# Patient Record
Sex: Male | Born: 1955 | Race: White | Hispanic: No | Smoking: Current every day smoker
Health system: Southern US, Community
[De-identification: ages and names within clinical notes are randomized; demographics above are authoritative.]

## PROBLEM LIST (undated history)

## (undated) DIAGNOSIS — R0600 Dyspnea, unspecified: Secondary | ICD-10-CM

## (undated) DIAGNOSIS — K219 Gastro-esophageal reflux disease without esophagitis: Secondary | ICD-10-CM

## (undated) DIAGNOSIS — F419 Anxiety disorder, unspecified: Secondary | ICD-10-CM

## (undated) DIAGNOSIS — G545 Neuralgic amyotrophy: Secondary | ICD-10-CM

## (undated) DIAGNOSIS — J45909 Unspecified asthma, uncomplicated: Secondary | ICD-10-CM

## (undated) DIAGNOSIS — T8859XA Other complications of anesthesia, initial encounter: Secondary | ICD-10-CM

## (undated) DIAGNOSIS — D649 Anemia, unspecified: Secondary | ICD-10-CM

## (undated) DIAGNOSIS — J449 Chronic obstructive pulmonary disease, unspecified: Secondary | ICD-10-CM

## (undated) DIAGNOSIS — C801 Malignant (primary) neoplasm, unspecified: Secondary | ICD-10-CM

## (undated) DIAGNOSIS — E559 Vitamin D deficiency, unspecified: Secondary | ICD-10-CM

## (undated) DIAGNOSIS — G629 Polyneuropathy, unspecified: Secondary | ICD-10-CM

## (undated) DIAGNOSIS — J189 Pneumonia, unspecified organism: Secondary | ICD-10-CM

## (undated) DIAGNOSIS — N433 Hydrocele, unspecified: Secondary | ICD-10-CM

## (undated) DIAGNOSIS — T4145XA Adverse effect of unspecified anesthetic, initial encounter: Secondary | ICD-10-CM

## (undated) DIAGNOSIS — M1990 Unspecified osteoarthritis, unspecified site: Secondary | ICD-10-CM

## (undated) HISTORY — PX: UPPER GI ENDOSCOPY: SHX6162

## (undated) HISTORY — PX: HERNIA REPAIR: SHX51

## (undated) HISTORY — PX: OTHER SURGICAL HISTORY: SHX169

## (undated) HISTORY — DX: Neuralgic amyotrophy: G54.5

## (undated) SURGERY — BRONCHOSCOPY, WITH FLUOROSCOPY
Anesthesia: General | Laterality: Bilateral

---

## 2003-01-29 DIAGNOSIS — C801 Malignant (primary) neoplasm, unspecified: Secondary | ICD-10-CM

## 2003-01-29 HISTORY — DX: Malignant (primary) neoplasm, unspecified: C80.1

## 2003-09-09 ENCOUNTER — Ambulatory Visit (HOSPITAL_COMMUNITY): Admission: RE | Admit: 2003-09-09 | Discharge: 2003-09-09 | Payer: Self-pay | Admitting: Family Medicine

## 2003-09-13 ENCOUNTER — Emergency Department (HOSPITAL_COMMUNITY): Admission: EM | Admit: 2003-09-13 | Discharge: 2003-09-13 | Payer: Self-pay | Admitting: Emergency Medicine

## 2003-10-06 ENCOUNTER — Emergency Department (HOSPITAL_COMMUNITY): Admission: EM | Admit: 2003-10-06 | Discharge: 2003-10-07 | Payer: Self-pay | Admitting: Emergency Medicine

## 2003-10-06 ENCOUNTER — Ambulatory Visit (HOSPITAL_BASED_OUTPATIENT_CLINIC_OR_DEPARTMENT_OTHER): Admission: RE | Admit: 2003-10-06 | Discharge: 2003-10-06 | Payer: Self-pay | Admitting: General Surgery

## 2003-10-06 ENCOUNTER — Encounter (INDEPENDENT_AMBULATORY_CARE_PROVIDER_SITE_OTHER): Payer: Self-pay | Admitting: Specialist

## 2003-10-06 ENCOUNTER — Ambulatory Visit (HOSPITAL_COMMUNITY): Admission: RE | Admit: 2003-10-06 | Discharge: 2003-10-06 | Payer: Self-pay | Admitting: General Surgery

## 2004-05-31 IMAGING — CR DG CHEST 2V
2 series · 2 of 2 positions shown · non-contrast
Comparison: none

CLINICAL DATA: Shortness of breath.
 TWO-VIEW CHEST, [DATE] 
 There is diffuse hyperinflation consistent with changes of COPD or asthmatic bronchitic.  Accentuated bronchial markings.  There are no infiltrates.  The heart and mediastinal structures are normal.
 IMPRESSION 
 Diffuse hyperinflation with accentuated bronchial markings consistent with asthmatic bronchitic and COPD.  No focal infiltrate.

[view not recorded (1 of 2)]
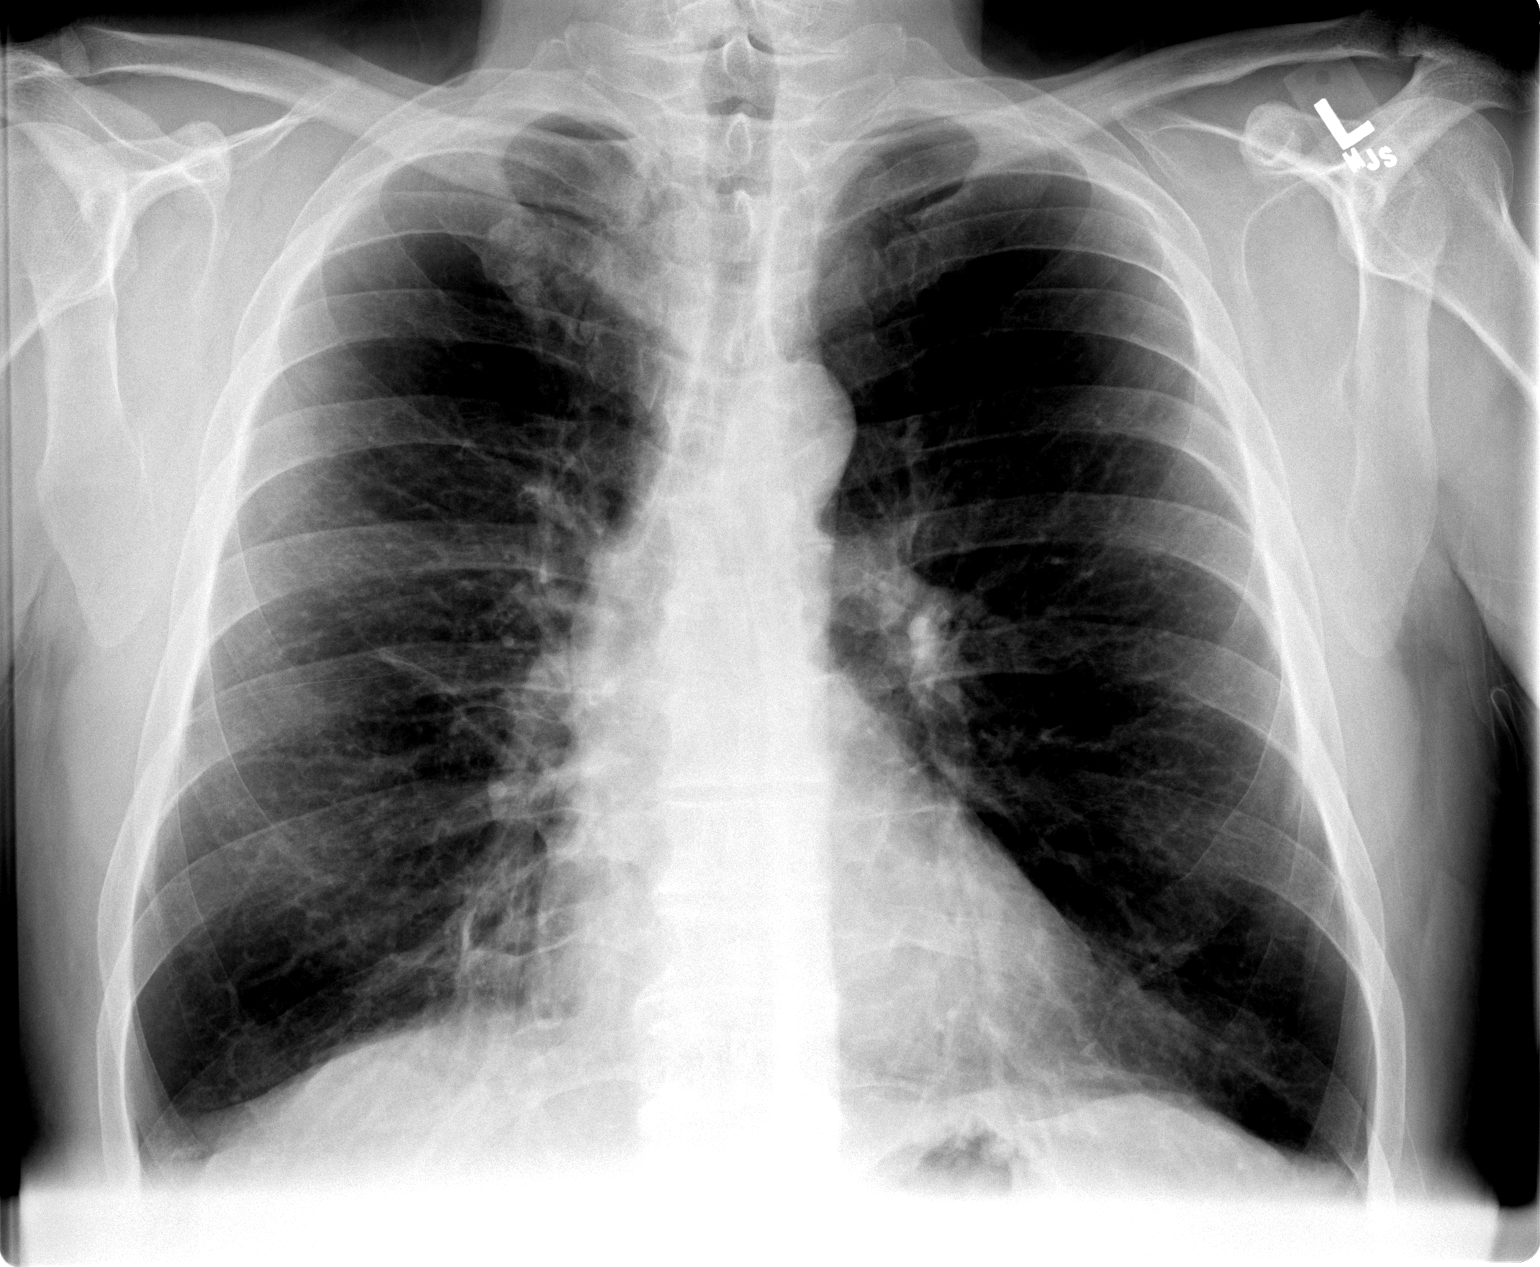

[view not recorded (2 of 2)]
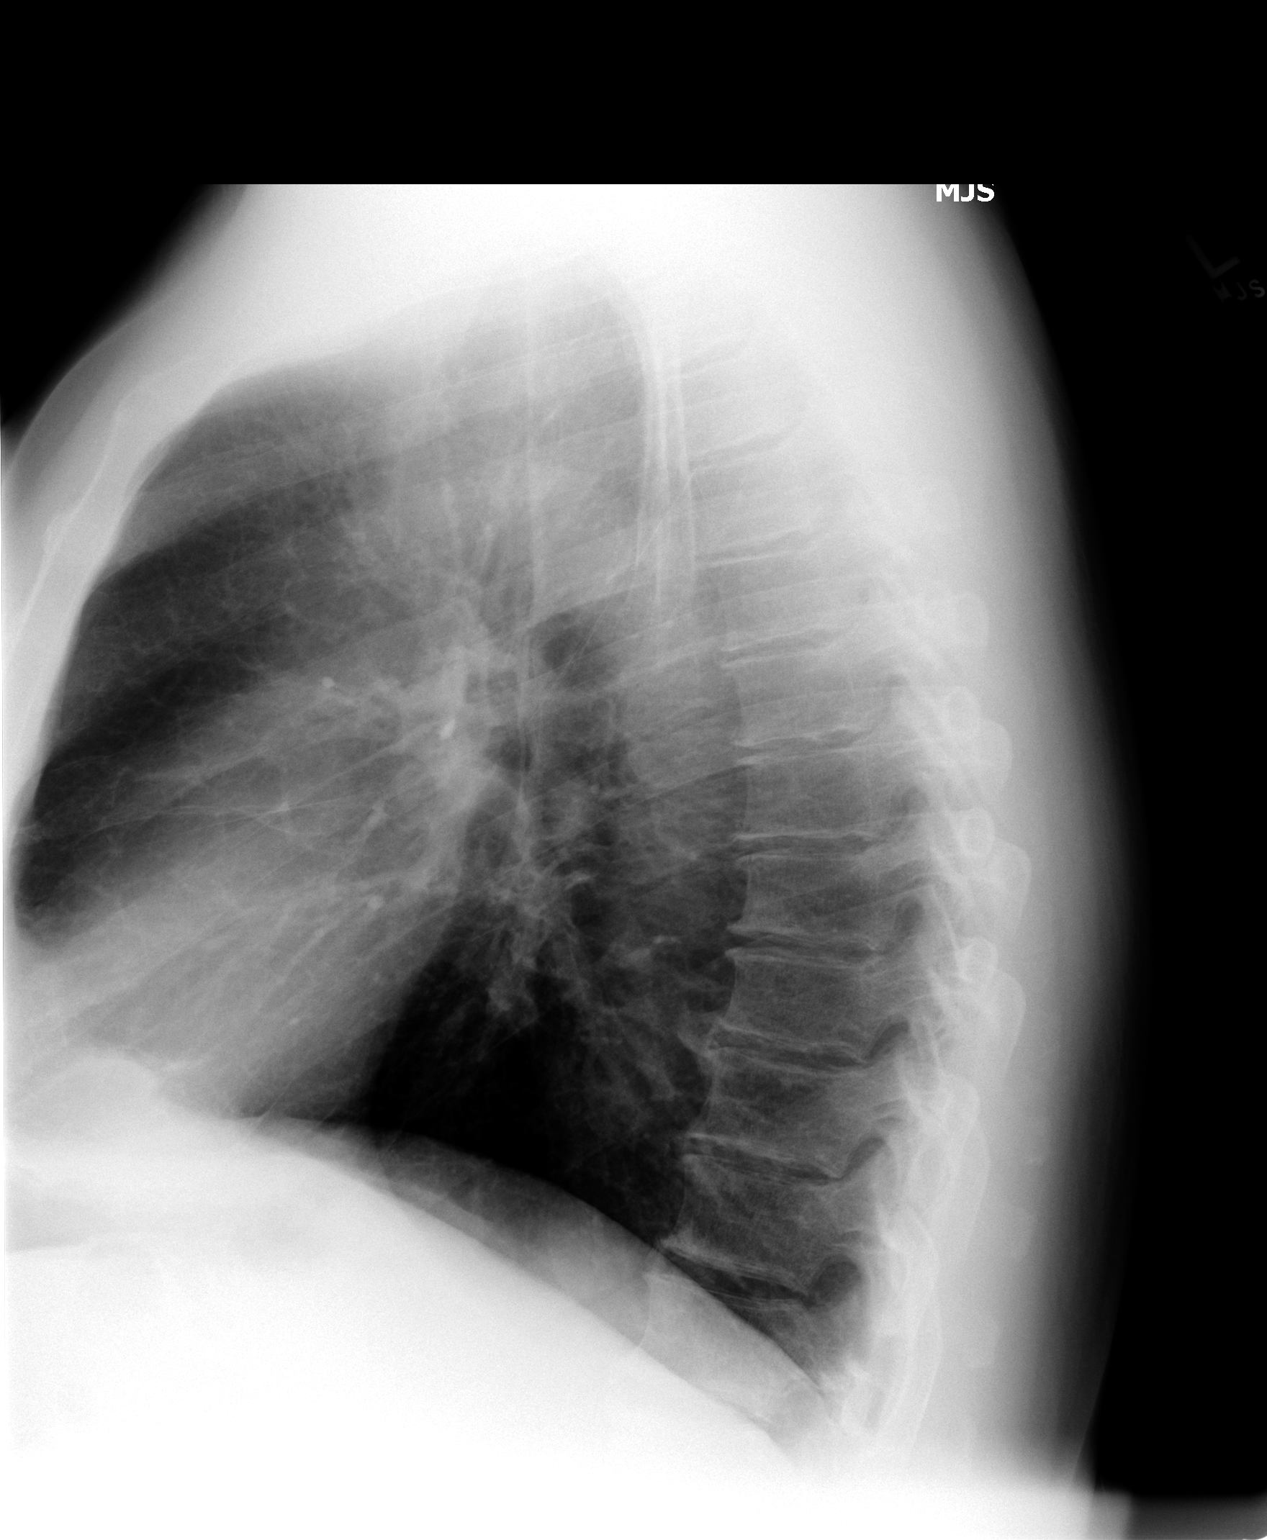

[2 of 2 positions shown; findings below may reference images not displayed]

## 2006-03-24 ENCOUNTER — Emergency Department (HOSPITAL_COMMUNITY): Admission: EM | Admit: 2006-03-24 | Discharge: 2006-03-24 | Payer: Self-pay | Admitting: Emergency Medicine

## 2006-04-28 ENCOUNTER — Emergency Department (HOSPITAL_COMMUNITY): Admission: EM | Admit: 2006-04-28 | Discharge: 2006-04-28 | Payer: Self-pay | Admitting: Family Medicine

## 2006-11-22 ENCOUNTER — Emergency Department (HOSPITAL_COMMUNITY): Admission: EM | Admit: 2006-11-22 | Discharge: 2006-11-22 | Payer: Self-pay | Admitting: Family Medicine

## 2008-01-29 HISTORY — PX: COLON RESECTION: SHX5231

## 2008-09-07 ENCOUNTER — Emergency Department (HOSPITAL_COMMUNITY): Admission: EM | Admit: 2008-09-07 | Discharge: 2008-09-07 | Payer: Self-pay | Admitting: Family Medicine

## 2009-08-23 ENCOUNTER — Emergency Department (HOSPITAL_COMMUNITY): Admission: EM | Admit: 2009-08-23 | Discharge: 2009-08-23 | Payer: Self-pay | Admitting: Family Medicine

## 2010-06-15 NOTE — Op Note (Signed)
[name], [name]                       ACCOUNT NO.:  [id number]   MEDICAL RECORD NO.:  [id number]                   PATIENT TYPE:  AMB   LOCATION:  DSC                                  FACILITY:  [facility]   PHYSICIAN:  Jimmye Norman III, M.D.               DATE OF BIRTH:  14-Jul-1955   DATE OF PROCEDURE:  10/06/2003  DATE OF DISCHARGE:                                 OPERATIVE REPORT   PREOPERATIVE DIAGNOSIS:  Bilateral inguinal hernias with a paraumbilical  ventral hernia.   POSTOPERATIVE DIAGNOSIS:  A large right direct inguinal hernia, smaller left  direct inguinal hernia, and moderate sized paraumbilical or supraumbilical  ventral hernia.   PROCEDURE:  Bilateral inguinal hernia repair with mesh along with a  paraumbilical ventral hernia repair without mesh.   ANESTHESIA:  General endotracheal.   ESTIMATED BLOOD LOSS:  30 to 40 cubic centimeters.   COMPLICATIONS:  None.   CONDITION:  Good.   The patient was injected with 30 cubic centimeters of 0.25% Marcaine with  epi at all sites.   DISPOSITION:  To home after staying in the PACU.   FINDINGS:  A large right direct, smaller left direct, and a supraumbilical,  paraumbilical hernia.   DESCRIPTION OF PROCEDURE:  The patient was taken to the operating room and  placed on the table in the supine position.  After an adequate endotracheal  anesthetic was administered, he was prepped and draped in the usual sterile  manner exposing initially both inguinal areas for the inguinal hernia  repairs.   The right inguinal hernia repair was done first.  An 8 cm transverse  incision was made at the level of the superficial ring and extended  superolaterally.  It was taken down to and through Scarpa's fascia using  electrocautery.  There was a lateral subcutaneous vein which was ligated  with 3-0 Vicryl.  Once we got down to the external oblique fascia, we opened  this fascia along its fibers down through the superficial ring.   There was  an ilioinguinal nerve which coursed along with the spermatic cord which was  spared.  We mobilized the spermatic cord at the pubic tubercle, but there  was a large direct hernia sac going down with the cord which had to be freed  up and separated from the spermatic cord.  This represented a very large  direct sac which was on the medial inferior aspect of the cord, not  anteromedially as normally seen with an indirect sac.   We were able to separate the spermatic cord and the large sac and, once we  had done so, we all sort of placed the spermatic cord on a work bench and  separately worked with the large direct sac.  We opened the sac and saw  there were no contained bowel contents although, deep at the base of the  sac, loops of small bowel could be seen.  We excised and sent to pathology a  large portion of the direct sac and then sutured that upon itself using a  running locking stitch of 0 Ethibond.  We then imbricated the residual  hernia defect on itself using interrupted 0 Ethibond sutures, sort of  pushing the hernia back into the floor of the inguinal canal.  We then  closed the floor using a piece of mesh tacking inferolaterally to the  reflected portion of the inguinal ligament and anteromedially to the  conjoined muscle or tendon.  This was done using a running 0 Prolene suture.  The oval mesh which had been used and had been soaked in antibiotic  solution, measured approximately 5 x 3 cm in size.   Once the mesh was in place, we irrigated again with antibiotic solution.  Then, we placed the spermatic cord back into the canal and closed the  external oblique fascia on top of the cord with a running 3-0 Vicryl suture.  Scarpa's fascia was reapproximated using  three interrupted simple stitches  of 3-0 Vicryl suture.  Then, the skin was closed using a running  subcuticular stitch of 4-0 Prolene.  At the end of the case, 0.25% Marcaine  with epi was injected at the  site.   The left side hernia was then performed with a standard incision done  paralleling or reflecting symmetrically the right side.  There was also a  large subcutaneous vein which had to be ligated on this side.  We took it  down to the external oblique fascia and opened it up to the floor where we  could see, though, a small direct hernia defect just sort of medial and  inferior to the spermatic cord.  We mobilized the spermatic cord using a  Penrose drain, imbricated the direct sac on itself  in the floor using  interrupted 0 Ethibond sutures.  Then, we closed this floor with the mesh in  the same manner as we did on the right side.  We closed the Scarpa's fascia  and the external oblique fascia in the standard manner with 3-0 Vicryl, and  the skin was closed in the same manner.  Marcaine was injected onto this  side as on the right.  We did close the skin using a running subcuticular  stitch of 4-0 Prolene.   Subsequently, we covered the hernia repairs and made a supraumbilical  curvilinear incision using a #15 blade down into the subcu.  Surprisingly,  the hernia sac of this umbilical hernia was very large measuring  approximately 10 cm in size.  We were able to dissect it circumferentially  down to his neck at the fascial edge which was then incised and, as we found  that it contained omentum in the sac, we came across the omentum with Kelly  clamps and resected it, tying off the Kelly clamps for residual omentum  using 2-0 Vicryl sutures.  Once this was allowed to fall back into the  preperitoneal space, we closed off the fascial defect which made it about 3  cm in size using, initially, interrupted #1 Novofil sutures that were placed  in a figure-of-eight manner.  We then ran a #1 Prolene from one end to the  other and tied it off to itself.  So, the defect was repaired using  interrupted figure-of-eight stitches of #1 Novofil and a running stitch of #1 Prolene.   No  subcutaneous sutures were made.  The skin was just closed using a  running  subcuticular stitch of 4-0 Vicryl.  This was also injected with Marcaine  prior to closure.  Sterile dressings were applied to all wounds, and the  patient was taken to the recovery room in stable condition.                                               Kathrin Ruddy, M.D.    JW/MEDQ  D:  10/06/2003  T:  10/06/2003  Job:  [id number]

## 2010-09-04 ENCOUNTER — Emergency Department (HOSPITAL_COMMUNITY)
Admission: EM | Admit: 2010-09-04 | Discharge: 2010-09-05 | Disposition: A | Discharge status: Home / Self Care | Payer: Self-pay | Attending: Emergency Medicine | Admitting: Emergency Medicine

## 2010-09-04 DIAGNOSIS — Z452 Encounter for adjustment and management of vascular access device: Secondary | ICD-10-CM | POA: Insufficient documentation

## 2010-09-04 DIAGNOSIS — J4489 Other specified chronic obstructive pulmonary disease: Secondary | ICD-10-CM | POA: Insufficient documentation

## 2010-09-04 DIAGNOSIS — R0602 Shortness of breath: Secondary | ICD-10-CM | POA: Insufficient documentation

## 2010-09-04 DIAGNOSIS — J449 Chronic obstructive pulmonary disease, unspecified: Secondary | ICD-10-CM | POA: Insufficient documentation

## 2010-09-05 ENCOUNTER — Emergency Department (HOSPITAL_COMMUNITY): Payer: Self-pay

## 2010-09-05 IMAGING — CR DG CHEST 2V
2 series · 2 of 2 positions shown · non-contrast
Comparison: Chest x-ray [DATE].

CLINICAL DATA: Shortness of breath.

CHEST - 2 VIEW

[w chest pa]
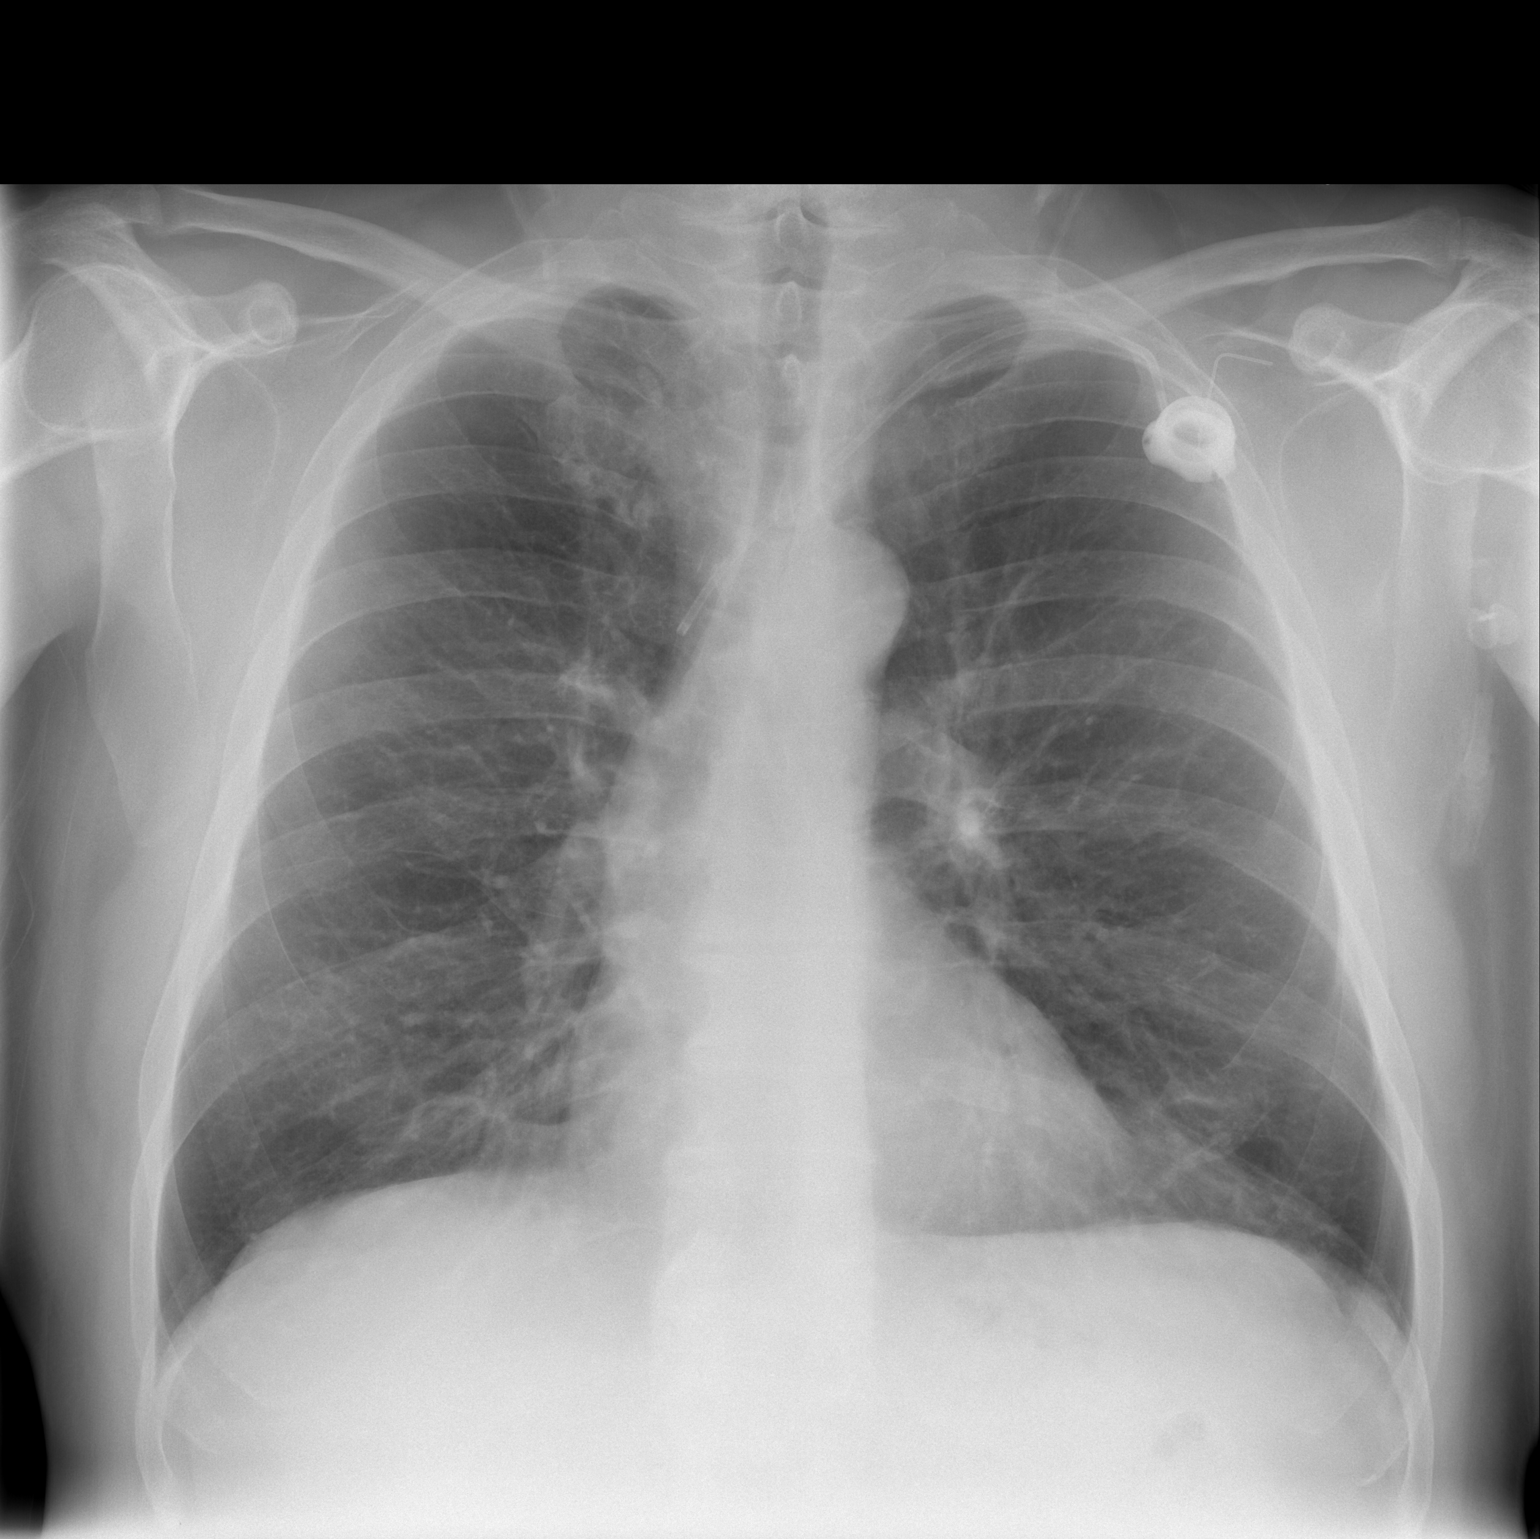

[w chest lat]
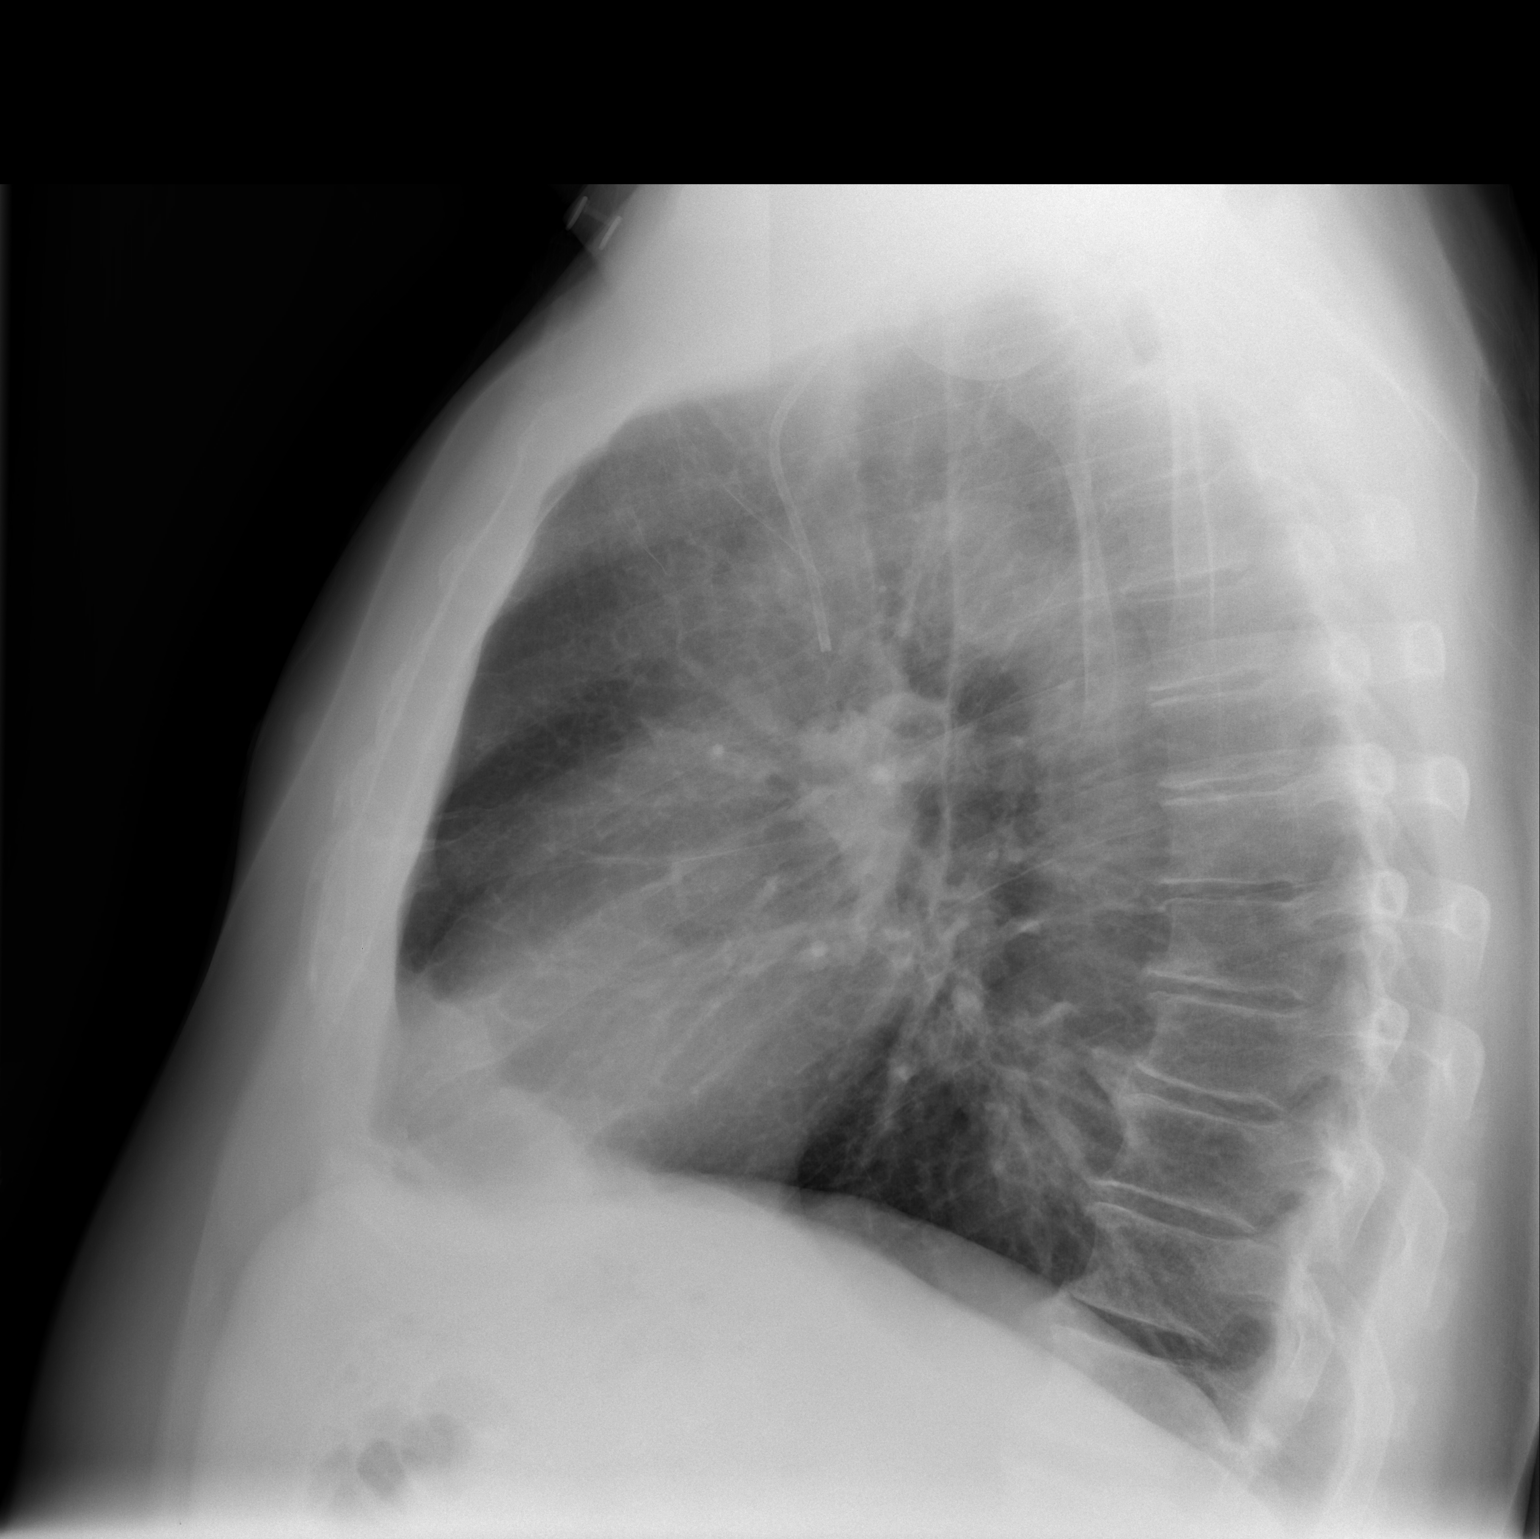

[2 of 2 positions shown; findings below may reference images not displayed]

FINDINGS: A left subclavian Port-A-Cath is noted.  The cardiac
silhouette, mediastinal and hilar contours are within normal
limits.  The lungs are clear.  The bony thorax is intact.
IMPRESSION: No acute cardiopulmonary findings.

## 2012-07-22 ENCOUNTER — Encounter (INDEPENDENT_AMBULATORY_CARE_PROVIDER_SITE_OTHER): Payer: Self-pay | Admitting: Surgery

## 2012-08-04 ENCOUNTER — Ambulatory Visit (INDEPENDENT_AMBULATORY_CARE_PROVIDER_SITE_OTHER): Payer: Self-pay | Admitting: Surgery

## 2012-08-17 ENCOUNTER — Encounter (INDEPENDENT_AMBULATORY_CARE_PROVIDER_SITE_OTHER): Payer: Self-pay | Admitting: Surgery

## 2016-09-09 ENCOUNTER — Emergency Department (HOSPITAL_COMMUNITY)
Admission: EM | Admit: 2016-09-09 | Discharge: 2016-09-09 | Disposition: A | Discharge status: Home / Self Care | Payer: Medicaid Other | Attending: Emergency Medicine | Admitting: Emergency Medicine

## 2016-09-09 ENCOUNTER — Encounter (HOSPITAL_COMMUNITY): Payer: Self-pay

## 2016-09-09 ENCOUNTER — Emergency Department (HOSPITAL_COMMUNITY): Payer: Medicaid Other

## 2016-09-09 DIAGNOSIS — K469 Unspecified abdominal hernia without obstruction or gangrene: Secondary | ICD-10-CM | POA: Diagnosis not present

## 2016-09-09 DIAGNOSIS — J449 Chronic obstructive pulmonary disease, unspecified: Secondary | ICD-10-CM | POA: Diagnosis not present

## 2016-09-09 DIAGNOSIS — Z859 Personal history of malignant neoplasm, unspecified: Secondary | ICD-10-CM | POA: Diagnosis not present

## 2016-09-09 DIAGNOSIS — F17210 Nicotine dependence, cigarettes, uncomplicated: Secondary | ICD-10-CM | POA: Insufficient documentation

## 2016-09-09 DIAGNOSIS — R1031 Right lower quadrant pain: Secondary | ICD-10-CM | POA: Diagnosis present

## 2016-09-09 HISTORY — DX: Vitamin D deficiency, unspecified: E55.9

## 2016-09-09 HISTORY — DX: Chronic obstructive pulmonary disease, unspecified: J44.9

## 2016-09-09 HISTORY — DX: Malignant (primary) neoplasm, unspecified: C80.1

## 2016-09-09 LAB — COMPREHENSIVE METABOLIC PANEL
ALBUMIN: 4.2 g/dL (ref 3.5–5.0)
ALT: 27 U/L (ref 17–63)
ANION GAP: 7 (ref 5–15)
AST: 25 U/L (ref 15–41)
Alkaline Phosphatase: 78 U/L (ref 38–126)
BUN: 12 mg/dL (ref 6–20)
CALCIUM: 9.8 mg/dL (ref 8.9–10.3)
CHLORIDE: 104 mmol/L (ref 101–111)
CO2: 27 mmol/L (ref 22–32)
CREATININE: 1.01 mg/dL (ref 0.61–1.24)
GFR calc non Af Amer: >60 mL/min (ref >60)
Glucose, Bld: 79 mg/dL (ref 65–99)
POTASSIUM: 4.3 mmol/L (ref 3.5–5.1)
SODIUM: 138 mmol/L (ref 135–145)
TOTAL PROTEIN: 7.6 g/dL (ref 6.5–8.1)
Total Bilirubin: 0.4 mg/dL (ref 0.3–1.2)

## 2016-09-09 LAB — CBC WITH DIFFERENTIAL/PLATELET
BASOS PCT: 1 %
Basophils Absolute: 0 10*3/uL (ref 0.0–0.1)
EOS ABS: 0.2 10*3/uL (ref 0.0–0.7)
EOS PCT: 3 %
HCT: 47.4 % (ref 39.0–52.0)
HEMOGLOBIN: 16 g/dL (ref 13.0–17.0)
Lymphocytes Relative: 25 %
Lymphs Abs: 1.7 10*3/uL (ref 0.7–4.0)
MCH: 31.9 pg (ref 26.0–34.0)
MCHC: 33.8 g/dL (ref 30.0–36.0)
MCV: 94.6 fL (ref 78.0–100.0)
MONO ABS: 0.9 10*3/uL (ref 0.1–1.0)
MONOS PCT: 13 %
NEUTROS PCT: 58 %
Neutro Abs: 4.1 10*3/uL (ref 1.7–7.7)
PLATELETS: 218 10*3/uL (ref 150–400)
RBC: 5.01 MIL/uL (ref 4.22–5.81)
RDW: 13.3 % (ref 11.5–15.5)
WBC: 7 10*3/uL (ref 4.0–10.5)

## 2016-09-09 LAB — URINALYSIS, ROUTINE W REFLEX MICROSCOPIC
Bilirubin Urine: NEGATIVE
GLUCOSE, UA: NEGATIVE mg/dL
HGB URINE DIPSTICK: NEGATIVE
Ketones, ur: NEGATIVE mg/dL
Leukocytes, UA: NEGATIVE
Nitrite: NEGATIVE
PROTEIN: NEGATIVE mg/dL
Specific Gravity, Urine: 1.003 — ABNORMAL LOW (ref 1.005–1.030)
pH: 6 (ref 5.0–8.0)

## 2016-09-09 LAB — LIPASE, BLOOD: LIPASE: 27 U/L (ref 11–51)

## 2016-09-09 LAB — TROPONIN I: Troponin I: <0.03 ng/mL (ref <0.03)

## 2016-09-09 IMAGING — CT CT CHEST W/ CM
2 of 5 series · 11 of 36 positions shown, 13 images · IV contrast (Isovue)
Comparison: CT of the abdomen and pelvis from [DATE], and CT of
the chest performed [DATE]

CLINICAL DATA: Acute onset of right lower quadrant abdominal sores.
Chronic right testicular swelling and difficulty urinating. Initial
encounter.

EXAM:
CT CHEST, ABDOMEN, AND PELVIS WITH CONTRAST
TECHNIQUE: Multidetector CT imaging of the chest, abdomen and pelvis was
performed following the standard protocol during bolus
administration of intravenous contrast.
CONTRAST:  100mL [21] IOPAMIDOL ([21]) INJECTION 61%

[Series 2: cap with · axial · 0.98mm/px · z∈[+832,+1476]mm · 8 of 163 slices shown, 10 images]
[im 17/163  mediastinal]
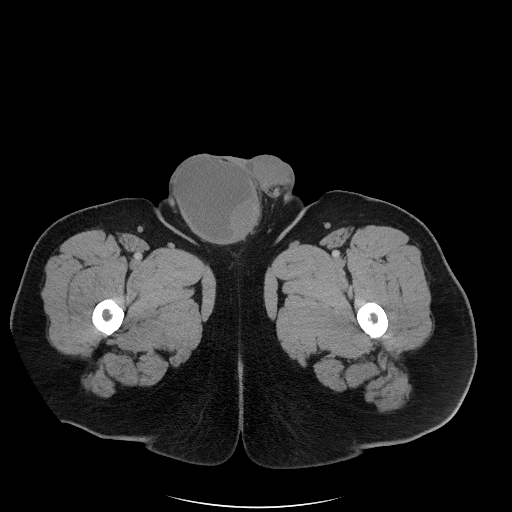
[im 17/163  lung]
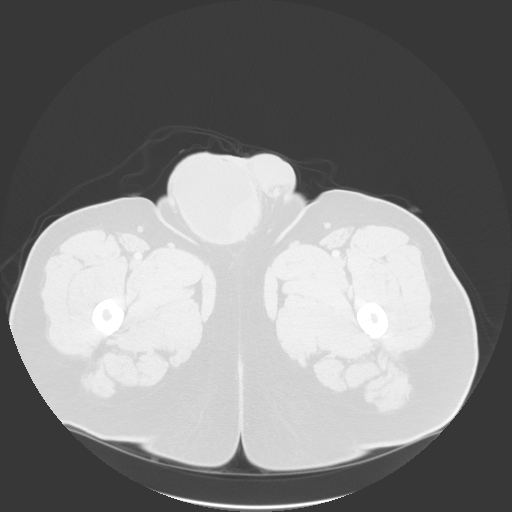
[im 33/163  lung]
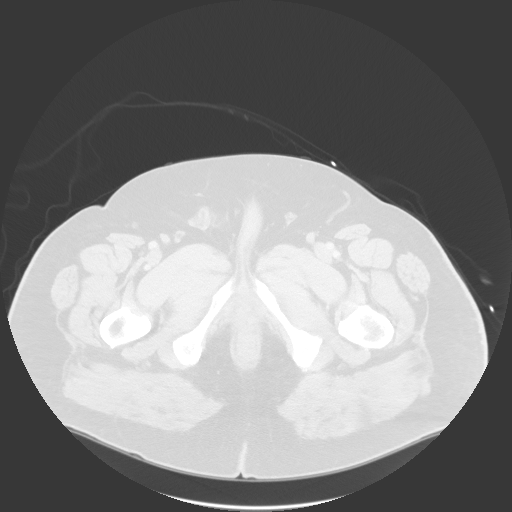
[im 49/163  lung]
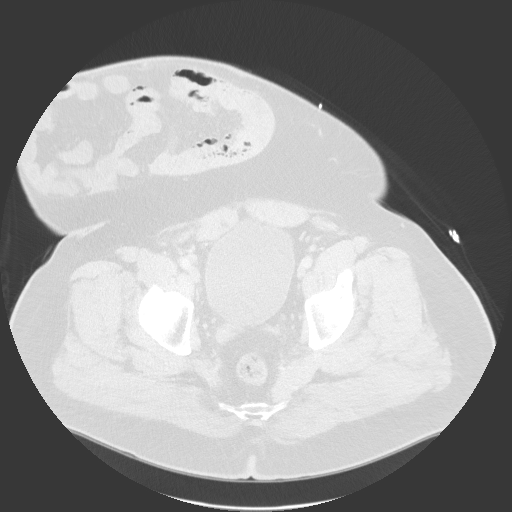
[im 65/163  lung]
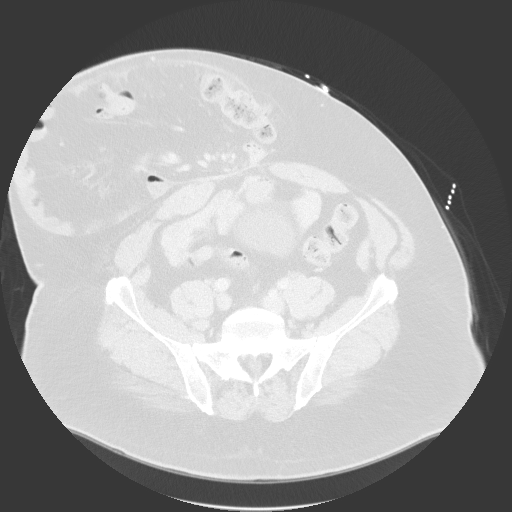
[im 98/163  mediastinal]
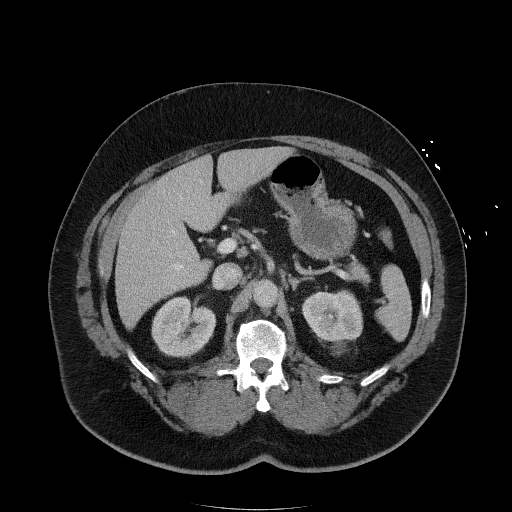
[im 98/163  lung]
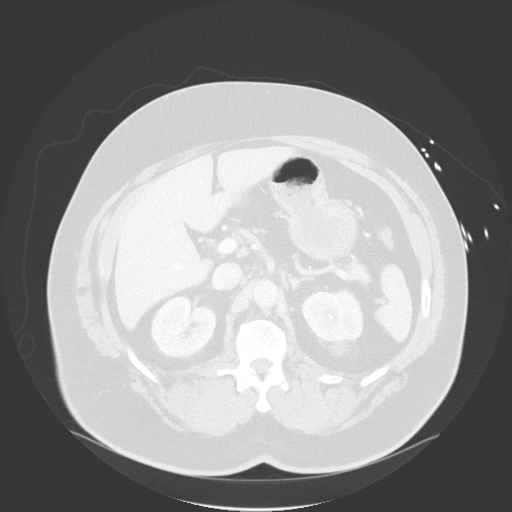
[im 114/163  lung]
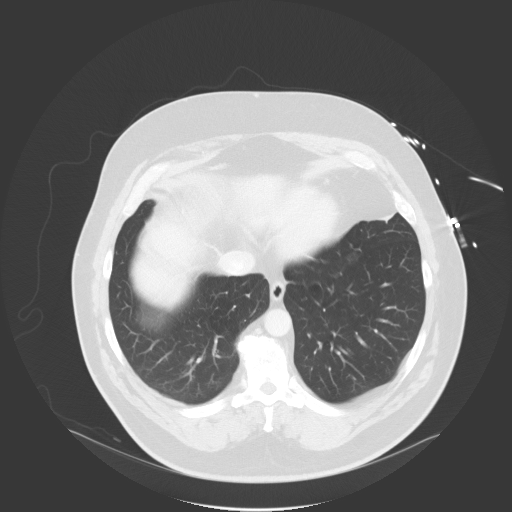
[im 130/163  lung]
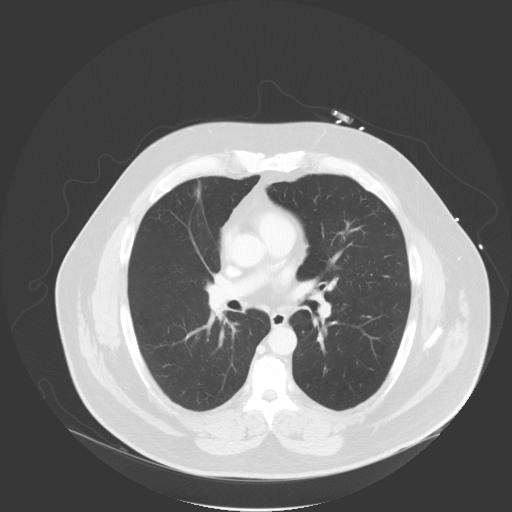
[im 146/163  lung]
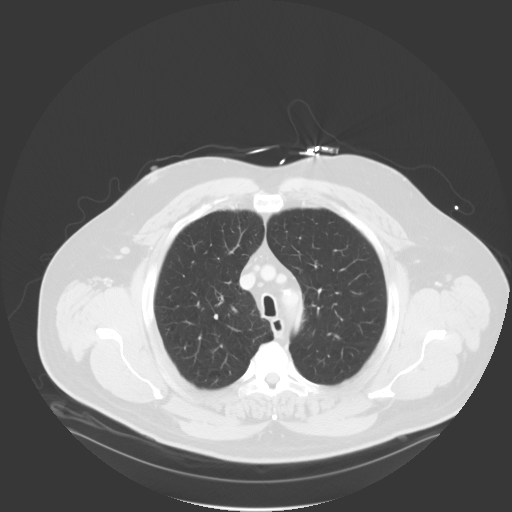

[Series 4: coronals · coronal · 0.97mm/px · 3 of 224 slices shown]
[im 45/224  lung]
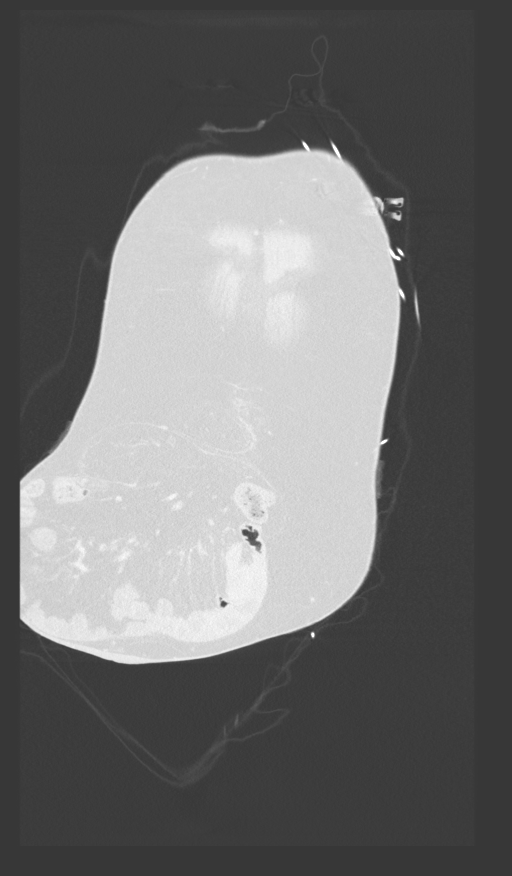
[im 90/224  lung]
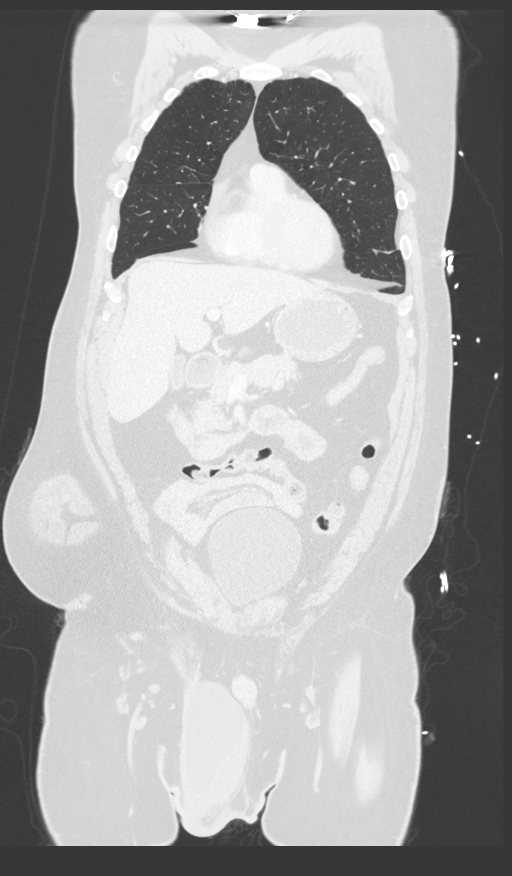
[im 134/224  lung]
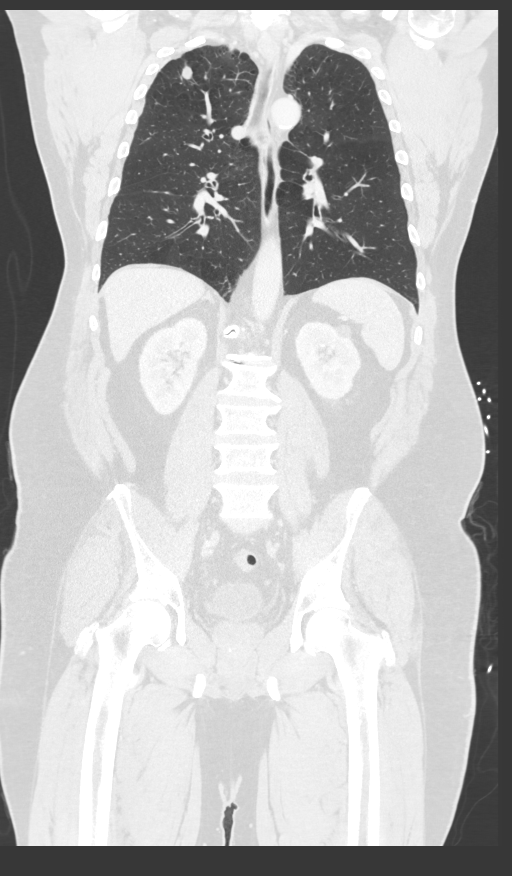

[11 of 36 positions shown; findings below may reference images not displayed]

FINDINGS: CT CHEST FINDINGS

Cardiovascular: Scattered coronary artery calcifications are seen.
The heart remains normal in size. Mild calcification along the
aortic arch and proximal great vessels.

Mediastinum/Nodes: No mediastinal lymphadenopathy is seen. No
pericardial effusion is identified. The thyroid gland is
unremarkable. No axillary lymphadenopathy is seen.

Lungs/Pleura: The 1.3 x 1.0 cm nodule at the right lung apex is
slightly decreased in size from [21] and likely benign. No pleural
effusion or pneumothorax is seen. Scattered peripheral blebs are
noted at the lung apices.

Musculoskeletal: No acute osseous abnormalities are identified. The
visualized musculature is unremarkable in appearance.

CT ABDOMEN PELVIS FINDINGS

Hepatobiliary: A peripherally enhancing 4.8 x 4.1 x 4.0 cm lesion at
the left hepatic lobe has increased only mildly in size from [21]
and likely reflects a benign hepatic hemangioma. The liver is
otherwise unremarkable. The gallbladder is within normal limits. The
common bile duct remains normal in caliber.

Pancreas: The pancreas is within normal limits.

Spleen: The spleen is unremarkable in appearance.

Adrenals/Urinary Tract: The adrenal glands are unremarkable in
appearance.

Scattered bilateral renal stones measure up to 5 mm in size. A left
renal cyst is noted. Mild haziness about the right kidney could
reflect mild right-sided pyelonephritis. There is no evidence of
hydronephrosis. No obstructing ureteral stones are seen.

Stomach/Bowel: A very large anterior abdominal wall hernia is noted
extending into the right pannus, containing multiple small and large
bowel loops, without evidence for obstruction or strangulation at
this time. Underlying postoperative change is noted, with an
ileocecal anastomosis within the hernia.

Mild wall thickening is noted along the duodenum, with associated
haziness. This could reflect mild acute infectious or inflammatory
duodenitis. The remaining colon is grossly unremarkable. The stomach
is grossly unremarkable in appearance.

Vascular/Lymphatic: Scattered calcification is seen along the distal
abdominal aorta and its branches. The abdominal aorta is otherwise
grossly unremarkable. The inferior vena cava is grossly
unremarkable. No retroperitoneal lymphadenopathy is seen. No pelvic
sidewall lymphadenopathy is identified.

Reproductive: The bladder is moderately distended and grossly
unremarkable. The prostate is borderline enlarged, measuring 4.9 cm
in transverse dimension.

A mildly complex large right-sided hydrocele is noted, with
associated peripheral soft tissue density. Underlying infection or
hematoma cannot be excluded.

Other: A moderate right periumbilical hernia is noted, containing
only fat.

Musculoskeletal: No acute osseous abnormalities are identified. The
visualized musculature is unremarkable in appearance.
IMPRESSION: 1. Very large anterior abdominal wall hernia noted extending into
the right pannus, containing multiple small and large bowel loops,
without evidence for obstruction or strangulation at this time.
2. Mild wall thickening along the duodenum, with associated
haziness. This could reflect a mild acute infectious or inflammatory
duodenitis.
3. Moderate right periumbilical hernia, containing only fat.
4. Mild haziness about the right kidney could reflect mild
right-sided pyelonephritis. Would correlate for any associated
symptoms.
5. Mildly complex large right-sided hydrocele, with associated
peripheral soft tissue density. Underlying infection or hematoma
cannot be excluded.
6. Scattered bilateral renal stones measure up to 5 mm in size. Left
renal cyst noted.
7. Scattered aortic atherosclerosis.
8. 1.3 x 1.0 cm nodule at the right lung apex is slightly decreased
in size from [21] and likely benign.
9. Scattered coronary artery calcifications seen.
10. Scattered peripheral blebs at the lung apices.
11. 4.8 cm hepatic lesion has increased only mildly in size from
[21] and likely reflects a benign hepatic hemangioma.
12. Borderline enlarged prostate.

## 2016-09-09 MED ORDER — IOPAMIDOL (ISOVUE-300) INJECTION 61%
100.0000 mL | Freq: Once | INTRAVENOUS | Status: AC | PRN
  Administered 2016-09-09: 100 mL via INTRAVENOUS

## 2016-09-09 MED ORDER — NYSTATIN 100000 UNIT/GM EX POWD
Freq: Four times a day (QID) | CUTANEOUS | 3 refills | Status: DC
Start: 2016-09-09 — End: 2016-09-09

## 2016-09-09 MED ORDER — NYSTATIN 100000 UNIT/GM EX POWD
Freq: Four times a day (QID) | CUTANEOUS | 3 refills | Status: DC
Start: 2016-09-09 — End: 2021-01-15

## 2016-09-09 MED ORDER — IPRATROPIUM-ALBUTEROL 0.5-2.5 (3) MG/3ML IN SOLN
3.0000 mL | Freq: Four times a day (QID) | RESPIRATORY_TRACT | Status: DC
Start: 2016-09-09 — End: 2016-09-10
  Administered 2016-09-09: 3 mL via RESPIRATORY_TRACT
  Filled 2016-09-09: qty 3

## 2016-09-09 NOTE — ED Provider Notes (Signed)
[facility] DEPT Provider Note   CSN: [id number] Arrival date & time: 09/09/16  1810     History   Chief Complaint Chief Complaint  Patient presents with  . Skin Ulcer  . Testicle Pain    HPI [name] is a 61 y.o. male.   Abdominal Pain   This is a new problem. The current episode started more than 1 week ago. The problem occurs constantly. The problem has been gradually worsening. The pain is associated with an unknown factor. The pain is located in the RLQ. The pain is moderate. Associated symptoms include nausea. Pertinent negatives include fever, belching, diarrhea, melena, constipation and dysuria.    Past Medical History:  Diagnosis Date  . Cancer ([place])   . COPD (chronic obstructive pulmonary disease) ([place])   . Vitamin D deficiency     There are no active problems to display for this patient.   Past Surgical History:  Procedure Laterality Date  . COLON RESECTION    . HERNIA REPAIR         Home Medications    Prior to Admission medications   Medication Sig Start Date End Date Taking? Authorizing Provider  albuterol (PROVENTIL HFA;VENTOLIN HFA) 108 (90 Base) MCG/ACT inhaler Inhale 1-2 puffs into the lungs every 6 (six) hours as needed for wheezing or shortness of breath.   Yes [provider]  aspirin EC 325 MG tablet Take 1,300 mg by mouth 2 (two) times daily as needed for mild pain or moderate pain.   Yes [provider]  ipratropium-albuterol (DUONEB) 0.5-2.5 (3) MG/3ML SOLN Take 3 mLs by nebulization every 6 (six) hours as needed (for shortness of breath).   Yes [provider]  nystatin (MYCOSTATIN/NYSTOP) powder Apply topically 4 (four) times daily. Until three days after rash improves 09/09/16   [name], [name], MD    Family History No family history on file.  Social History Social History  Substance Use Topics  . Smoking status: Current Every Day Smoker    Packs/day: 1.00    Types: Cigarettes  . Smokeless  tobacco: Never Used  . Alcohol use No     Allergies   Patient has no known allergies.   Review of Systems Review of Systems  Constitutional: Negative for fever.  Gastrointestinal: Positive for abdominal pain and nausea. Negative for constipation, diarrhea and melena.  Genitourinary: Negative for dysuria.  All other systems reviewed and are negative.    Physical Exam Updated Vital Signs BP 120/70 (BP Location: Right Arm)   Pulse 82   Temp 98 F (36.7 C) (Oral)   Resp 20   Ht 5\' 9"  (1.753 m)   Wt 133.8 kg (295 lb)   SpO2 94%   BMI 43.56 kg/m   Physical Exam  Constitutional: He is oriented to person, place, and time. He appears well-developed and well-nourished.  HENT:  Head: Normocephalic and atraumatic.  Eyes: Conjunctivae and EOM are normal.  Neck: Normal range of motion.  Cardiovascular: Normal rate.   No murmur heard. Pulmonary/Chest: Effort normal. No respiratory distress.  Abdominal: Soft. He exhibits mass. He exhibits no distension. There is no rebound. No hernia.  Musculoskeletal: Normal range of motion. He exhibits no edema or deformity.  Neurological: He is alert and oriented to person, place, and time. No cranial nerve deficit. Coordination normal.  Skin: Skin is warm and dry.  Excoriation and healing wounds to large right abdominal protrusion  Nursing note and vitals reviewed.    ED Treatments / Results  Labs (all labs ordered are listed, but only abnormal results are displayed) Labs Reviewed  URINALYSIS, ROUTINE W REFLEX MICROSCOPIC - Abnormal; Notable for the following:       Result Value   Color, Urine STRAW (*)    Specific Gravity, Urine 1.003 (*)    All other components within normal limits  CBC WITH DIFFERENTIAL/PLATELET  COMPREHENSIVE METABOLIC PANEL  LIPASE, BLOOD  TROPONIN I    EKG  EKG Interpretation  Date/Time:  Monday September 09 2016 19:32:36 EDT Ventricular Rate:  75 PR Interval:    QRS Duration: 88 QT Interval:  379 QTC  Calculation: 424 R Axis:   75 Text Interpretation:  Sinus rhythm RSR' in V1 or V2, right VCD or RVH Minimal ST elevation, inferior leads Baseline wander in lead(s) II III aVF No significant change since last tracing Confirmed by Merrily Pew [phone]) on 09/09/2016 8:35:25 PM       Radiology Ct Chest W Contrast  Result Date: 09/09/2016 CLINICAL DATA:  Acute onset of right lower quadrant abdominal sores. Chronic right testicular swelling and difficulty urinating. Initial encounter. EXAM: CT CHEST, ABDOMEN, AND PELVIS WITH CONTRAST TECHNIQUE: Multidetector CT imaging of the chest, abdomen and pelvis was performed following the standard protocol during bolus administration of intravenous contrast. CONTRAST:  139mL ISOVUE-300 IOPAMIDOL (ISOVUE-300) INJECTION 61% COMPARISON:  CT of the abdomen and pelvis from 04/23/2012, and CT of the chest performed 10/05/2014 FINDINGS: CT CHEST FINDINGS Cardiovascular: Scattered coronary artery calcifications are seen. The heart remains normal in size. Mild calcification along the aortic arch and proximal great vessels. Mediastinum/Nodes: No mediastinal lymphadenopathy is seen. No pericardial effusion is identified. The thyroid gland is unremarkable. No axillary lymphadenopathy is seen. Lungs/Pleura: The 1.3 x 1.0 cm nodule at the right lung apex is slightly decreased in size from 2014 and likely benign. No pleural effusion or pneumothorax is seen. Scattered peripheral blebs are noted at the lung apices. Musculoskeletal: No acute osseous abnormalities are identified. The visualized musculature is unremarkable in appearance. CT ABDOMEN PELVIS FINDINGS Hepatobiliary: A peripherally enhancing 4.8 x 4.1 x 4.0 cm lesion at the left hepatic lobe has increased only mildly in size from 2016 and likely reflects a benign hepatic hemangioma. The liver is otherwise unremarkable. The gallbladder is within normal limits. The common bile duct remains normal in caliber. Pancreas: The pancreas  is within normal limits. Spleen: The spleen is unremarkable in appearance. Adrenals/Urinary Tract: The adrenal glands are unremarkable in appearance. Scattered bilateral renal stones measure up to 5 mm in size. A left renal cyst is noted. Mild haziness about the right kidney could reflect mild right-sided pyelonephritis. There is no evidence of hydronephrosis. No obstructing ureteral stones are seen. Stomach/Bowel: A very large anterior abdominal wall hernia is noted extending into the right pannus, containing multiple small and large bowel loops, without evidence for obstruction or strangulation at this time. Underlying postoperative change is noted, with an ileocecal anastomosis within the hernia. Mild wall thickening is noted along the duodenum, with associated haziness. This could reflect mild acute infectious or inflammatory duodenitis. The remaining colon is grossly unremarkable. The stomach is grossly unremarkable in appearance. Vascular/Lymphatic: Scattered calcification is seen along the distal abdominal aorta and its branches. The abdominal aorta is otherwise grossly unremarkable. The inferior vena cava is grossly unremarkable. No retroperitoneal lymphadenopathy is seen. No pelvic sidewall lymphadenopathy is identified. Reproductive: The bladder is moderately distended and grossly unremarkable. The prostate is borderline enlarged, measuring 4.9 cm in transverse dimension. A mildly complex large right-sided  hydrocele is noted, with associated peripheral soft tissue density. Underlying infection or hematoma cannot be excluded. Other: A moderate right periumbilical hernia is noted, containing only fat. Musculoskeletal: No acute osseous abnormalities are identified. The visualized musculature is unremarkable in appearance. IMPRESSION: 1. Very large anterior abdominal wall hernia noted extending into the right pannus, containing multiple small and large bowel loops, without evidence for obstruction or  strangulation at this time. 2. Mild wall thickening along the duodenum, with associated haziness. This could reflect a mild acute infectious or inflammatory duodenitis. 3. Moderate right periumbilical hernia, containing only fat. 4. Mild haziness about the right kidney could reflect mild right-sided pyelonephritis. Would correlate for any associated symptoms. 5. Mildly complex large right-sided hydrocele, with associated peripheral soft tissue density. Underlying infection or hematoma cannot be excluded. 6. Scattered bilateral renal stones measure up to 5 mm in size. Left renal cyst noted. 7. Scattered aortic atherosclerosis. 8. 1.3 x 1.0 cm nodule at the right lung apex is slightly decreased in size from 2014 and likely benign. 9. Scattered coronary artery calcifications seen. 10. Scattered peripheral blebs at the lung apices. 11. 4.8 cm hepatic lesion has increased only mildly in size from 2016 and likely reflects a benign hepatic hemangioma. 12. Borderline enlarged prostate. Electronically Signed   By: Garald Balding M.D.   On: 09/09/2016 21:52   Ct Abdomen Pelvis W Contrast  Result Date: 09/09/2016 CLINICAL DATA:  Acute onset of right lower quadrant abdominal sores. Chronic right testicular swelling and difficulty urinating. Initial encounter. EXAM: CT CHEST, ABDOMEN, AND PELVIS WITH CONTRAST TECHNIQUE: Multidetector CT imaging of the chest, abdomen and pelvis was performed following the standard protocol during bolus administration of intravenous contrast. CONTRAST:  154mL ISOVUE-300 IOPAMIDOL (ISOVUE-300) INJECTION 61% COMPARISON:  CT of the abdomen and pelvis from 04/23/2012, and CT of the chest performed 10/05/2014 FINDINGS: CT CHEST FINDINGS Cardiovascular: Scattered coronary artery calcifications are seen. The heart remains normal in size. Mild calcification along the aortic arch and proximal great vessels. Mediastinum/Nodes: No mediastinal lymphadenopathy is seen. No pericardial effusion is  identified. The thyroid gland is unremarkable. No axillary lymphadenopathy is seen. Lungs/Pleura: The 1.3 x 1.0 cm nodule at the right lung apex is slightly decreased in size from 2014 and likely benign. No pleural effusion or pneumothorax is seen. Scattered peripheral blebs are noted at the lung apices. Musculoskeletal: No acute osseous abnormalities are identified. The visualized musculature is unremarkable in appearance. CT ABDOMEN PELVIS FINDINGS Hepatobiliary: A peripherally enhancing 4.8 x 4.1 x 4.0 cm lesion at the left hepatic lobe has increased only mildly in size from 2016 and likely reflects a benign hepatic hemangioma. The liver is otherwise unremarkable. The gallbladder is within normal limits. The common bile duct remains normal in caliber. Pancreas: The pancreas is within normal limits. Spleen: The spleen is unremarkable in appearance. Adrenals/Urinary Tract: The adrenal glands are unremarkable in appearance. Scattered bilateral renal stones measure up to 5 mm in size. A left renal cyst is noted. Mild haziness about the right kidney could reflect mild right-sided pyelonephritis. There is no evidence of hydronephrosis. No obstructing ureteral stones are seen. Stomach/Bowel: A very large anterior abdominal wall hernia is noted extending into the right pannus, containing multiple small and large bowel loops, without evidence for obstruction or strangulation at this time. Underlying postoperative change is noted, with an ileocecal anastomosis within the hernia. Mild wall thickening is noted along the duodenum, with associated haziness. This could reflect mild acute infectious or inflammatory duodenitis. The remaining colon  is grossly unremarkable. The stomach is grossly unremarkable in appearance. Vascular/Lymphatic: Scattered calcification is seen along the distal abdominal aorta and its branches. The abdominal aorta is otherwise grossly unremarkable. The inferior vena cava is grossly unremarkable. No  retroperitoneal lymphadenopathy is seen. No pelvic sidewall lymphadenopathy is identified. Reproductive: The bladder is moderately distended and grossly unremarkable. The prostate is borderline enlarged, measuring 4.9 cm in transverse dimension. A mildly complex large right-sided hydrocele is noted, with associated peripheral soft tissue density. Underlying infection or hematoma cannot be excluded. Other: A moderate right periumbilical hernia is noted, containing only fat. Musculoskeletal: No acute osseous abnormalities are identified. The visualized musculature is unremarkable in appearance. IMPRESSION: 1. Very large anterior abdominal wall hernia noted extending into the right pannus, containing multiple small and large bowel loops, without evidence for obstruction or strangulation at this time. 2. Mild wall thickening along the duodenum, with associated haziness. This could reflect a mild acute infectious or inflammatory duodenitis. 3. Moderate right periumbilical hernia, containing only fat. 4. Mild haziness about the right kidney could reflect mild right-sided pyelonephritis. Would correlate for any associated symptoms. 5. Mildly complex large right-sided hydrocele, with associated peripheral soft tissue density. Underlying infection or hematoma cannot be excluded. 6. Scattered bilateral renal stones measure up to 5 mm in size. Left renal cyst noted. 7. Scattered aortic atherosclerosis. 8. 1.3 x 1.0 cm nodule at the right lung apex is slightly decreased in size from 2014 and likely benign. 9. Scattered coronary artery calcifications seen. 10. Scattered peripheral blebs at the lung apices. 11. 4.8 cm hepatic lesion has increased only mildly in size from 2016 and likely reflects a benign hepatic hemangioma. 12. Borderline enlarged prostate. Electronically Signed   By: Garald Balding M.D.   On: 09/09/2016 21:52    Procedures Procedures (including critical care time)  Medications Ordered in ED Medications    iopamidol (ISOVUE-300) 61 % injection 100 mL (100 mLs Intravenous Contrast Given 09/09/16 2118)     Initial Impression / Assessment and Plan / ED Course  I have reviewed the triage vital signs and the nursing notes.  Pertinent labs & imaging results that were available during my care of the patient were reviewed by me and considered in my medical decision making (see chart for details).     Ct done to eval large abdominal swelling, suspected malignancy vs hernia but overall unremarkable. No new masses. No new metastasis. No e/o strangulation, incarceration or other hernia emergencies.   Final Clinical Impressions(s) / ED Diagnoses   Final diagnoses:  Abdominal hernia without obstruction and without gangrene, recurrence not specified, unspecified hernia type    New Prescriptions Discharge Medication List as of 09/09/2016 10:30 PM    START taking these medications   Details  nystatin (MYCOSTATIN/NYSTOP) powder Apply topically 4 (four) times daily. Until three days after rash improves, Starting Mon 09/09/2016, Print         Insiya Oshea, [name], MD 09/10/16 2019

## 2016-09-09 NOTE — ED Notes (Signed)
After walking to the restroom, pt became mildly, sob. Family requesting to use his own breathing machine. Dr. Rogene Houston gave this RN a verbal order.

## 2016-09-09 NOTE — ED Notes (Signed)
Respiratory paged

## 2016-09-09 NOTE — ED Triage Notes (Signed)
Patient reports of sores to right lower abdomen, states "they feel like they are inside my stomach coming out". Hx of colon cancer. States he has seen Dr. Melina Copa for complaint couple weeks ago. States he was referred to [facility]. HX of colon cancer. Also reports of right testicle swelling with difficulty urinating x3 years.   Patient states he hasn't had the money to get healthcare since 2016 due to financial issues.

## 2016-09-09 NOTE — ED Notes (Signed)
Gave EKG to Dr.Mesner 

## 2016-11-29 ENCOUNTER — Other Ambulatory Visit: Payer: Self-pay | Admitting: Urology

## 2016-12-11 ENCOUNTER — Encounter (HOSPITAL_BASED_OUTPATIENT_CLINIC_OR_DEPARTMENT_OTHER): Payer: Self-pay | Admitting: *Deleted

## 2016-12-11 ENCOUNTER — Other Ambulatory Visit: Payer: Self-pay

## 2016-12-11 NOTE — Progress Notes (Signed)
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

## 2016-12-16 ENCOUNTER — Ambulatory Visit (HOSPITAL_BASED_OUTPATIENT_CLINIC_OR_DEPARTMENT_OTHER): Payer: Medicaid Other | Admitting: Anesthesiology

## 2016-12-16 ENCOUNTER — Encounter (HOSPITAL_BASED_OUTPATIENT_CLINIC_OR_DEPARTMENT_OTHER)
Admission: RE | Disposition: A | Discharge status: Home / Self Care | Payer: Self-pay | Source: Ambulatory Visit | Attending: Urology

## 2016-12-16 ENCOUNTER — Encounter (HOSPITAL_BASED_OUTPATIENT_CLINIC_OR_DEPARTMENT_OTHER): Payer: Self-pay | Admitting: *Deleted

## 2016-12-16 ENCOUNTER — Ambulatory Visit (HOSPITAL_BASED_OUTPATIENT_CLINIC_OR_DEPARTMENT_OTHER)
Admission: RE | Admit: 2016-12-16 | Discharge: 2016-12-16 | Disposition: A | Discharge status: Home / Self Care | Payer: Medicaid Other | Source: Ambulatory Visit | Attending: Urology | Admitting: Urology

## 2016-12-16 ENCOUNTER — Other Ambulatory Visit: Payer: Self-pay

## 2016-12-16 DIAGNOSIS — K219 Gastro-esophageal reflux disease without esophagitis: Secondary | ICD-10-CM | POA: Insufficient documentation

## 2016-12-16 DIAGNOSIS — F419 Anxiety disorder, unspecified: Secondary | ICD-10-CM | POA: Diagnosis not present

## 2016-12-16 DIAGNOSIS — N433 Hydrocele, unspecified: Secondary | ICD-10-CM | POA: Insufficient documentation

## 2016-12-16 DIAGNOSIS — Z7982 Long term (current) use of aspirin: Secondary | ICD-10-CM | POA: Insufficient documentation

## 2016-12-16 DIAGNOSIS — Z79899 Other long term (current) drug therapy: Secondary | ICD-10-CM | POA: Insufficient documentation

## 2016-12-16 HISTORY — DX: Gastro-esophageal reflux disease without esophagitis: K21.9

## 2016-12-16 HISTORY — DX: Adverse effect of unspecified anesthetic, initial encounter: T41.45XA

## 2016-12-16 HISTORY — DX: Unspecified osteoarthritis, unspecified site: M19.90

## 2016-12-16 HISTORY — DX: Dyspnea, unspecified: R06.00

## 2016-12-16 HISTORY — DX: Anxiety disorder, unspecified: F41.9

## 2016-12-16 HISTORY — DX: Pneumonia, unspecified organism: J18.9

## 2016-12-16 HISTORY — DX: Polyneuropathy, unspecified: G62.9

## 2016-12-16 HISTORY — DX: Unspecified asthma, uncomplicated: J45.909

## 2016-12-16 HISTORY — DX: Hydrocele, unspecified: N43.3

## 2016-12-16 HISTORY — DX: Anemia, unspecified: D64.9

## 2016-12-16 HISTORY — PX: HYDROCELE EXCISION: SHX482

## 2016-12-16 HISTORY — DX: Other complications of anesthesia, initial encounter: T88.59XA

## 2016-12-16 LAB — POCT HEMOGLOBIN-HEMACUE: Hemoglobin: 14.7 g/dL (ref 13.0–17.0)

## 2016-12-16 SURGERY — HYDROCELECTOMY
Anesthesia: Monitor Anesthesia Care | Laterality: Right

## 2016-12-16 MED ORDER — PROPOFOL 500 MG/50ML IV EMUL
INTRAVENOUS | Status: AC
  Filled 2016-12-16: qty 50

## 2016-12-16 MED ORDER — LACTATED RINGERS IV SOLN
INTRAVENOUS | Status: DC
  Administered 2016-12-16 (×2): via INTRAVENOUS
  Filled 2016-12-16: qty 1000

## 2016-12-16 MED ORDER — HYDROCODONE-ACETAMINOPHEN 5-325 MG PO TABS
ORAL_TABLET | ORAL | Status: AC
  Filled 2016-12-16: qty 1

## 2016-12-16 MED ORDER — PHENYLEPHRINE HCL 10 MG/ML IJ SOLN
INTRAVENOUS | Status: DC | PRN
  Administered 2016-12-16: 25 ug/min via INTRAVENOUS

## 2016-12-16 MED ORDER — ONDANSETRON HCL 4 MG/2ML IJ SOLN
4.0000 mg | Freq: Once | INTRAMUSCULAR | Status: DC | PRN
  Filled 2016-12-16: qty 2

## 2016-12-16 MED ORDER — PHENYLEPHRINE HCL 10 MG/ML IJ SOLN
INTRAMUSCULAR | Status: AC
  Filled 2016-12-16: qty 1

## 2016-12-16 MED ORDER — PHENYLEPHRINE 40 MCG/ML (10ML) SYRINGE FOR IV PUSH (FOR BLOOD PRESSURE SUPPORT)
PREFILLED_SYRINGE | INTRAVENOUS | Status: DC | PRN
  Administered 2016-12-16 (×3): 80 ug via INTRAVENOUS

## 2016-12-16 MED ORDER — FENTANYL CITRATE (PF) 100 MCG/2ML IJ SOLN
25.0000 ug | INTRAMUSCULAR | Status: DC | PRN
  Filled 2016-12-16: qty 1

## 2016-12-16 MED ORDER — BUPIVACAINE IN DEXTROSE 0.75-8.25 % IT SOLN
INTRATHECAL | Status: DC | PRN
  Administered 2016-12-16: 10 mg via INTRATHECAL

## 2016-12-16 MED ORDER — HYDROCODONE-ACETAMINOPHEN 5-325 MG PO TABS: 1.0000 | ORAL_TABLET | ORAL | 0 refills | Status: AC | PRN

## 2016-12-16 MED ORDER — PROPOFOL 10 MG/ML IV BOLUS
INTRAVENOUS | Status: DC | PRN
  Administered 2016-12-16: 30 mg via INTRAVENOUS

## 2016-12-16 MED ORDER — PROPOFOL 10 MG/ML IV BOLUS
INTRAVENOUS | Status: AC
  Filled 2016-12-16: qty 20

## 2016-12-16 MED ORDER — CEFAZOLIN SODIUM-DEXTROSE 2-4 GM/100ML-% IV SOLN
INTRAVENOUS | Status: AC
  Filled 2016-12-16: qty 100

## 2016-12-16 MED ORDER — MIDAZOLAM HCL 2 MG/2ML IJ SOLN
INTRAMUSCULAR | Status: AC
  Filled 2016-12-16: qty 2

## 2016-12-16 MED ORDER — PROPOFOL 500 MG/50ML IV EMUL
INTRAVENOUS | Status: DC | PRN
  Administered 2016-12-16: 50 ug/kg/min via INTRAVENOUS

## 2016-12-16 MED ORDER — FENTANYL CITRATE (PF) 100 MCG/2ML IJ SOLN
INTRAMUSCULAR | Status: AC
  Filled 2016-12-16: qty 2

## 2016-12-16 MED ORDER — LIDOCAINE HCL 2 % IJ SOLN
INTRAMUSCULAR | Status: DC | PRN
  Administered 2016-12-16: 10 mL

## 2016-12-16 MED ORDER — MIDAZOLAM HCL 5 MG/5ML IJ SOLN
INTRAMUSCULAR | Status: DC | PRN
  Administered 2016-12-16 (×2): 1 mg via INTRAVENOUS

## 2016-12-16 MED ORDER — HYDROCODONE-ACETAMINOPHEN 5-325 MG PO TABS
1.0000 | ORAL_TABLET | ORAL | Status: DC | PRN
  Administered 2016-12-16: 1 via ORAL
  Filled 2016-12-16: qty 1

## 2016-12-16 MED ORDER — CEFAZOLIN SODIUM-DEXTROSE 2-4 GM/100ML-% IV SOLN
2.0000 g | INTRAVENOUS | Status: AC
  Administered 2016-12-16: 2 g via INTRAVENOUS
  Filled 2016-12-16: qty 100

## 2016-12-16 MED ORDER — MEPERIDINE HCL 25 MG/ML IJ SOLN
6.2500 mg | INTRAMUSCULAR | Status: DC | PRN
  Filled 2016-12-16: qty 1

## 2016-12-16 SURGICAL SUPPLY — 38 items
BLADE CLIPPER SURG (BLADE) ×3 IMPLANT
BLADE SURG 15 STRL LF DISP TIS (BLADE) ×1 IMPLANT
BLADE SURG 15 STRL SS (BLADE) ×2
BNDG GAUZE ELAST 4 BULKY (GAUZE/BANDAGES/DRESSINGS) ×3 IMPLANT
CANISTER SUCT 3000ML PPV (MISCELLANEOUS) ×3 IMPLANT
CANISTER SUCTION 1200CC (MISCELLANEOUS) IMPLANT
CLEANER CAUTERY TIP 5X5 PAD (MISCELLANEOUS) IMPLANT
COVER BACK TABLE 60X90IN (DRAPES) ×3 IMPLANT
COVER MAYO STAND STRL (DRAPES) ×3 IMPLANT
DERMABOND ADVANCED (GAUZE/BANDAGES/DRESSINGS) ×2
DERMABOND ADVANCED .7 DNX12 (GAUZE/BANDAGES/DRESSINGS) ×1 IMPLANT
DISSECTOR ROUND CHERRY 3/8 STR (MISCELLANEOUS) IMPLANT
DRAIN PENROSE 18X1/4 LTX STRL (WOUND CARE) IMPLANT
DRAPE LAPAROTOMY 100X72 PEDS (DRAPES) ×3 IMPLANT
ELECT REM PT RETURN 9FT ADLT (ELECTROSURGICAL) ×3
ELECTRODE REM PT RTRN 9FT ADLT (ELECTROSURGICAL) ×1 IMPLANT
GAUZE SPONGE 4X4 16PLY XRAY LF (GAUZE/BANDAGES/DRESSINGS) ×3 IMPLANT
GAUZE SPONGE 4X4 8PLY STR LF (GAUZE/BANDAGES/DRESSINGS) ×3 IMPLANT
GLOVE BIO SURGEON STRL SZ7.5 (GLOVE) ×3 IMPLANT
GOWN STRL REUS W/ TWL XL LVL3 (GOWN DISPOSABLE) ×1 IMPLANT
GOWN STRL REUS W/TWL XL LVL3 (GOWN DISPOSABLE) ×2
KIT RM TURNOVER CYSTO AR (KITS) ×3 IMPLANT
NEEDLE HYPO 22GX1.5 SAFETY (NEEDLE) ×3 IMPLANT
NS IRRIG 500ML POUR BTL (IV SOLUTION) ×6 IMPLANT
PACK BASIN DAY SURGERY FS (CUSTOM PROCEDURE TRAY) ×3 IMPLANT
PAD CLEANER CAUTERY TIP 5X5 (MISCELLANEOUS)
PENCIL BUTTON HOLSTER BLD 10FT (ELECTRODE) ×3 IMPLANT
SUPPORT SCROTAL LG STRP (MISCELLANEOUS) ×2 IMPLANT
SUPPORTER ATHLETIC LG (MISCELLANEOUS) ×1
SUT CHROMIC 3 0 SH 27 (SUTURE) ×3 IMPLANT
SUT VIC AB 4-0 PS2 27 (SUTURE) ×3 IMPLANT
SYR CONTROL 10ML LL (SYRINGE) IMPLANT
TOWEL OR 17X24 6PK STRL BLUE (TOWEL DISPOSABLE) ×6 IMPLANT
TRAY DSU PREP LF (CUSTOM PROCEDURE TRAY) ×3 IMPLANT
TUBE CONNECTING 12'X1/4 (SUCTIONS) ×1
TUBE CONNECTING 12X1/4 (SUCTIONS) ×2 IMPLANT
WATER STERILE IRR 500ML POUR (IV SOLUTION) IMPLANT
YANKAUER SUCT BULB TIP NO VENT (SUCTIONS) ×3 IMPLANT

## 2016-12-16 NOTE — Anesthesia Procedure Notes (Signed)
Spinal  Patient location during procedure: OR Start time: 12/16/2016 9:08 AM End time: 12/16/2016 9:12 AM Staffing Anesthesiologist: Janeece Riggers, MD Preanesthetic Checklist Completed: patient identified, site marked, surgical consent, pre-op evaluation, timeout performed, IV checked, risks and benefits discussed and monitors and equipment checked Spinal Block Patient position: sitting Prep: DuraPrep Patient monitoring: heart rate, cardiac monitor, continuous pulse ox and blood pressure Approach: midline Location: L4-5 Injection technique: single-shot Needle Needle type: Sprotte  Needle gauge: 24 G Needle length: 9 cm Assessment Sensory level: T8

## 2016-12-16 NOTE — Anesthesia Postprocedure Evaluation (Signed)
Anesthesia Post Note  Patient: [name]  Procedure(s) Performed: HYDROCELECTOMY ADULT RGHT (Right )     Patient location during evaluation: PACU Anesthesia Type: Spinal Level of consciousness: oriented and awake and alert Pain management: pain level controlled Vital Signs Assessment: post-procedure vital signs reviewed and stable Respiratory status: spontaneous breathing, respiratory function stable and patient connected to nasal cannula oxygen Cardiovascular status: blood pressure returned to baseline and stable Postop Assessment: no headache, no backache and no apparent nausea or vomiting Anesthetic complications: no    Last Vitals:  Vitals:   12/16/16 1020 12/16/16 1030  BP: (!) 71/57 (!) 82/56  Pulse: 85 88  Resp: 13 17  Temp:    SpO2: 98% 96%    Last Pain:  Vitals:   12/16/16 0729  TempSrc:   PainSc: 2                  Quanta Roher

## 2016-12-16 NOTE — Discharge Instructions (Addendum)
Discharge instructions following scrotal surgery  Call your doctor for:  Fever is greater than 100.5  Severe nausea or vomiting  Increasing pain not controlled by pain medication  Increasing redness or drainage from incisions  The number for questions or concerns is [phone]  Activity level: No lifting greater than 20 pounds (about equal to milk) for the next 2 weeks or until cleared to do so at follow-up appointment.  Otherwise activity as tolerated by comfort level.  Diet: May resume your regular diet as tolerated  Driving: No driving while still taking opiate pain medications (weight at least 6-8 hours after last dose).  No driving if you still sore from surgery as it may limit her ability to react quickly if necessary.   Shower/bath: May shower and get incision wet pad dry immediately following.  Do not scrub vigorously for the next 2-3 weeks.  Do not soak incision (ID soaking in bath or swimming) until told he may do so by Dr., as this may promote a wound infection.  Wound care: He may cover wounds with sterile gauze as needed to prevent incisions rubbing on close follow-up in any seepage.  Where tight fitting underpants/scrotal support for at least 2 weeks.  He should apply cold compresses (ice or sac of frozen peas/corn) to your scrotum for at least 48 hours to reduce the swelling for 15 minutes at a time indirectly.  You should expect that his scrotum will swell up initially and then get smaller over the next 2-4 weeks.  Follow-up appointments: Follow-up appointment will be scheduled with Dr. Gloriann Loan for a wound check.  Post Anesthesia Home Care Instructions  Activity: Get plenty of rest for the remainder of the day. A responsible individual must stay with you for 24 hours following the procedure.  For the next 24 hours, DO NOT: -Drive a car -Paediatric nurse -Drink alcoholic beverages -Take any medication unless instructed by your physician -Make any legal  decisions or sign important papers.  Meals: Start with liquid foods such as gelatin or soup. Progress to regular foods as tolerated. Avoid greasy, spicy, heavy foods. If nausea and/or vomiting occur, drink only clear liquids until the nausea and/or vomiting subsides. Call your physician if vomiting continues.  Special Instructions/Symptoms: Your throat may feel dry or sore from the anesthesia or the breathing tube placed in your throat during surgery. If this causes discomfort, gargle with warm salt water. The discomfort should disappear within 24 hours.  If you had a scopolamine patch placed behind your ear for the management of post- operative nausea and/or vomiting:  1. The medication in the patch is effective for 72 hours, after which it should be removed.  Wrap patch in a tissue and discard in the trash. Wash hands thoroughly with soap and water. 2. You may remove the patch earlier than 72 hours if you experience unpleasant side effects which may include dry mouth, dizziness or visual disturbances. 3. Avoid touching the patch. Wash your hands with soap and water after contact with the patch.

## 2016-12-16 NOTE — Anesthesia Preprocedure Evaluation (Addendum)
Anesthesia Evaluation  Patient identified by MRN, date of birth, ID band Patient awake    Reviewed: Allergy & Precautions, NPO status , Patient's Chart, lab work & pertinent test results  History of Anesthesia Complications (+) AWARENESS UNDER ANESTHESIA, PROLONGED EMERGENCE and history of anesthetic complications  Airway Mallampati: III  TM Distance: >3 FB Neck ROM: Full    Dental no notable dental hx. (+) Teeth Intact, Poor Dentition   Pulmonary neg pulmonary ROS, asthma , COPD,  COPD inhaler, Current Smoker,    Pulmonary exam normal breath sounds clear to auscultation       Cardiovascular negative cardio ROS Normal cardiovascular exam Rhythm:Regular Rate:Normal     Neuro/Psych Anxiety negative neurological ROS  negative psych ROS   GI/Hepatic negative GI ROS, Neg liver ROS, GERD  ,  Endo/Other  negative endocrine ROSMorbid obesity  Renal/GU negative Renal ROS  negative genitourinary   Musculoskeletal negative musculoskeletal ROS (+) Arthritis , Osteoarthritis,    Abdominal   Peds negative pediatric ROS (+)  Hematology negative hematology ROS (+)   Anesthesia Other Findings Large umbilical hernia with shift to Right, size of soccer ball.    Reproductive/Obstetrics negative OB ROS                           Anesthesia Physical Anesthesia Plan  ASA: III  Anesthesia Plan: Spinal and MAC   Post-op Pain Management:    Induction:   PONV Risk Score and Plan: 2 and Treatment may vary due to age or medical condition, Ondansetron, Dexamethasone and Midazolam  Airway Management Planned: Nasal Cannula, Natural Airway, Mask and Simple Face Mask  Additional Equipment:   Intra-op Plan:   Post-operative Plan:   Informed Consent:   Dental advisory given  Plan Discussed with: CRNA and Surgeon  Anesthesia Plan Comments: ( )      Anesthesia Quick Evaluation

## 2016-12-16 NOTE — Interval H&P Note (Signed)
History and Physical Interval Note:  12/16/2016 8:41 AM  [name]  has presented today for surgery, with the diagnosis of RIGHT HYDROCELE  The various methods of treatment have been discussed with the patient and family. After consideration of risks, benefits and other options for treatment, the patient has consented to  Procedure(s): HYDROCELECTOMY ADULT RGHT (Right) as a surgical intervention .  The patient's history has been reviewed, patient examined, no change in status, stable for surgery.  I have reviewed the patient's chart and labs.  Questions were answered to the patient's satisfaction.     Marton Redwood, III

## 2016-12-16 NOTE — Op Note (Signed)
.  Operative Note  Preoperative diagnosis:  1.  Right hydrocele  Postoperative diagnosis: 1.  Right hydrocele  Procedure(s): 1.  Right hydrocelectomy  Surgeon: Link Snuffer, MD  Assistants: none  Anesthesia: Gen.  Complications: none immediate  EBL: 20 mL  Specimens: 1. none  Drains/Catheters: 1. none  Condition following The operation: stable  Intraoperative findings: Right hydrocele  Indication: the patient is a 61 year old man with scrotal swelling found to have a right hydrocele confirmed by ultrasound.  Description of procedure:  The patient was identified and consent was obtained. He was taken back to the operating room and placed in the supine position. He was placed under general anesthesia and prepped and draped in the standard sterile fashion.Perioperative antibiotic was given. Timeout was performed. A 4 cm right hemiscrotal transverse incision was made. This was carried down through the dartos and the testicle along with the hydrocele sac was delivered onto the operative field. The gubernacular attachments were released with a combination of blunt dissection and electrocautery. Spot electrocautery was used for hemostasis as necessary. Care was taken to not use electrocautery close to the cord itself. The hydrocele sac was incised and fluid was evacuated. I extended the hydrocele sac incision proximally and distally. Excess sac was excised. This was discarded.  There was not enough excess hydrocele sac to evert around the testicle and reapproximated without excess tension and therefore all of the hydrocele sac was excised. Hemostasis was obtained carefully with Bovie electrocautery. I irrigated the scrotal cavity and there was good hemostasis. The testicle was delivered back into the scrotum in its proper anatomical position. The dartos was closed with a running 3-0 chromic. The skin was closed with interrupted 3-0 chromic sutures. 10 mL of 2% lidocaine without epinephrine  was instilled for anesthetic affect. Dermabond was applied and then a dressing along with scrotal support was applied. This concluded the operation. The patient tolerated the procedure well and was stable postoperatively.  Plan: The patient will return in several weeks for postoperative wound check.

## 2016-12-16 NOTE — H&P (Signed)
CC: I have swelling in my scrotum.  HPI: [name] is a 61 year-old male patient who was referred by Dr. Lubertha South. Melina Copa, DO who is here for scrotal swelling.  He first noticed his hydrocele 5 years ago. His hydrocele is on the right side. He does have pain on the side of his hydrocele. His hydrocele does cause restriction of normal activities.   He has not had injuries to the testicles or scrotum. He has not had scrotal surgery. His hydrocele does bother him enough to consider surgical repair. He has not had a testicular infection.   CT scan from 09/09/2016 that revealed the right-sided hydrocele.     ALLERGIES: None   MEDICATIONS: Aspirin 325 mg tablet  Albuterol Sulfate  Proventil Hfa     GU PSH: None   NON-GU PSH: Hernia Repair Partial Remove Colon    GU PMH: None   NON-GU PMH: Anxiety Arthritis Asthma Colon Cancer, History GERD    FAMILY HISTORY: 2 daughters - Other   SOCIAL HISTORY: Marital Status: Single Preferred Language: English; Race: White Current Smoking Status: Patient smokes.   Tobacco Use Assessment Completed: Used Tobacco in last 30 days? Drinks 2 caffeinated drinks per day.    REVIEW OF SYSTEMS:    GU Review Male:   Patient reports trouble starting your stream. Patient denies frequent urination, hard to postpone urination, burning/ pain with urination, get up at night to urinate, leakage of urine, stream starts and stops, have to strain to urinate , erection problems, and penile pain.  Gastrointestinal (Upper):   Patient denies nausea, vomiting, and indigestion/ heartburn.  Gastrointestinal (Lower):   Patient denies diarrhea and constipation.  Constitutional:   Patient denies fever, night sweats, weight loss, and fatigue.  Skin:   Patient denies skin rash/ lesion and itching.  Eyes:   Patient reports blurred vision. Patient denies double vision.  Ears/ Nose/ Throat:   Patient denies sore throat and sinus problems.  Hematologic/Lymphatic:    Patient denies swollen glands and easy bruising.  Cardiovascular:   Patient denies leg swelling and chest pains.  Respiratory:   Patient reports cough and shortness of breath.   Endocrine:   Patient denies excessive thirst.  Musculoskeletal:   Patient reports back pain and joint pain.   Neurological:   Patient reports headaches. Patient denies dizziness.  Psychologic:   Patient reports anxiety. Patient denies depression.   Notes: weak stream    VITAL SIGNS:      11/28/2016 11:00 AM  Weight 290 lb / 131.54 kg  Height 68 in / 172.72 cm  BP 132/85 mmHg  Heart Rate 92 /min  Temperature 98.6 F / 37 C  BMI 44.1 kg/m   GU PHYSICAL EXAMINATION:    Scrotum: Large right hemiscrotum consistent with hydrocele. Left testicle palpable and normal.  Penis: Penis is retracted   MULTI-SYSTEM PHYSICAL EXAMINATION:    Constitutional: Well-nourished. No physical deformities. Normally developed. Good grooming.  Respiratory: No labored breathing, no use of accessory muscles.   Cardiovascular: Normal temperature, adequate peripheral perfusion  Skin: No paleness, no jaundice, no cyanosis. No lesion, no ulcer, no rash.  Neurologic / Psychiatric: Oriented to time, oriented to place, oriented to person. No depression, no anxiety, no agitation.  Gastrointestinal: No mass, no tenderness, no rigidity, morbidly obese with large abdominal hernia  Eyes: Normal conjunctivae. Normal eyelids.  Musculoskeletal: Normal gait and station of head and neck.     PAST DATA REVIEWED:  Source Of History:  Patient  X-Ray  Review: C.T. Abdomen/Pelvis: Reviewed Films. Reviewed Report. Discussed With Patient.     PROCEDURES:          Urinalysis Dipstick Dipstick Cont'd  Color: Yellow Bilirubin: Neg  Appearance: Clear Ketones: Neg  Specific Gravity: 1.010 Blood: Neg  pH: 6.5 Protein: Neg  Glucose: Neg Urobilinogen: 0.2    Nitrites: Neg    Leukocyte Esterase: Neg    ASSESSMENT:      ICD-10 Details  1 GU:    Hydrocele - N43.0    PLAN:           Document Letter(s):  Created for Patient: Clinical Summary         Notes:   The patient would like to proceed with hydrocelectomy. He understands the risks including but not limited to bleeding, infection, injury to surrounding structures, pain, hematoma, possibility of recurrence of hydrocele.  Signed by Link Snuffer, III, M.D. on 11/28/16 at 11:39 AM (EDT)

## 2016-12-16 NOTE — Transfer of Care (Signed)
Immediate Anesthesia Transfer of Care Note  Patient: [name]  Procedure(s) Performed: Procedure(s) (LRB): HYDROCELECTOMY ADULT RGHT (Right)  Patient Location: PACU  Anesthesia Type: MAC  Level of Consciousness: awake, alert , oriented and patient cooperative  Airway & Oxygen Therapy: Patient Spontanous Breathing and Patient connected to face mask oxygen  Post-op Assessment: Report given to PACU RN and Post -op Vital signs reviewed and stable  Post vital signs: Reviewed and stable  Complications: No apparent anesthesia complications  Last Vitals:  Vitals:   12/16/16 0652 12/16/16 1008  BP: 137/71   Pulse: (!) 101   Resp: (!) 22   Temp: 37.3 C 36.9 C  SpO2: 94%     Last Pain:  Vitals:   12/16/16 0729  TempSrc:   PainSc: 2       Patients Stated Pain Goal: 5 (12/16/16 0729)

## 2016-12-17 ENCOUNTER — Encounter (HOSPITAL_BASED_OUTPATIENT_CLINIC_OR_DEPARTMENT_OTHER): Payer: Self-pay | Admitting: Urology

## 2017-03-20 DIAGNOSIS — R0600 Dyspnea, unspecified: Secondary | ICD-10-CM | POA: Insufficient documentation

## 2017-03-20 DIAGNOSIS — K439 Ventral hernia without obstruction or gangrene: Secondary | ICD-10-CM | POA: Insufficient documentation

## 2019-04-06 DIAGNOSIS — H0102B Squamous blepharitis left eye, upper and lower eyelids: Secondary | ICD-10-CM | POA: Diagnosis not present

## 2019-04-06 DIAGNOSIS — H0102A Squamous blepharitis right eye, upper and lower eyelids: Secondary | ICD-10-CM | POA: Diagnosis not present

## 2019-04-06 DIAGNOSIS — H16223 Keratoconjunctivitis sicca, not specified as Sjogren's, bilateral: Secondary | ICD-10-CM | POA: Diagnosis not present

## 2019-05-10 DIAGNOSIS — H0102B Squamous blepharitis left eye, upper and lower eyelids: Secondary | ICD-10-CM | POA: Diagnosis not present

## 2019-05-10 DIAGNOSIS — H0102A Squamous blepharitis right eye, upper and lower eyelids: Secondary | ICD-10-CM | POA: Diagnosis not present

## 2019-05-10 DIAGNOSIS — H16223 Keratoconjunctivitis sicca, not specified as Sjogren's, bilateral: Secondary | ICD-10-CM | POA: Diagnosis not present

## 2019-06-09 DIAGNOSIS — H0102B Squamous blepharitis left eye, upper and lower eyelids: Secondary | ICD-10-CM | POA: Diagnosis not present

## 2019-06-09 DIAGNOSIS — H2513 Age-related nuclear cataract, bilateral: Secondary | ICD-10-CM | POA: Diagnosis not present

## 2019-06-09 DIAGNOSIS — H16223 Keratoconjunctivitis sicca, not specified as Sjogren's, bilateral: Secondary | ICD-10-CM | POA: Diagnosis not present

## 2019-06-09 DIAGNOSIS — H0102A Squamous blepharitis right eye, upper and lower eyelids: Secondary | ICD-10-CM | POA: Diagnosis not present

## 2019-08-30 DIAGNOSIS — H0102B Squamous blepharitis left eye, upper and lower eyelids: Secondary | ICD-10-CM | POA: Diagnosis not present

## 2019-08-30 DIAGNOSIS — H2513 Age-related nuclear cataract, bilateral: Secondary | ICD-10-CM | POA: Diagnosis not present

## 2019-08-30 DIAGNOSIS — H0102A Squamous blepharitis right eye, upper and lower eyelids: Secondary | ICD-10-CM | POA: Diagnosis not present

## 2019-08-30 DIAGNOSIS — H16223 Keratoconjunctivitis sicca, not specified as Sjogren's, bilateral: Secondary | ICD-10-CM | POA: Diagnosis not present

## 2019-08-31 DIAGNOSIS — J449 Chronic obstructive pulmonary disease, unspecified: Secondary | ICD-10-CM | POA: Diagnosis not present

## 2019-09-27 DIAGNOSIS — R0981 Nasal congestion: Secondary | ICD-10-CM | POA: Diagnosis not present

## 2019-09-27 DIAGNOSIS — R079 Chest pain, unspecified: Secondary | ICD-10-CM | POA: Diagnosis not present

## 2019-09-27 DIAGNOSIS — M940 Chondrocostal junction syndrome [Tietze]: Secondary | ICD-10-CM | POA: Diagnosis not present

## 2019-09-27 DIAGNOSIS — R519 Headache, unspecified: Secondary | ICD-10-CM | POA: Diagnosis not present

## 2019-09-27 DIAGNOSIS — K2900 Acute gastritis without bleeding: Secondary | ICD-10-CM | POA: Diagnosis not present

## 2019-09-30 DIAGNOSIS — H2513 Age-related nuclear cataract, bilateral: Secondary | ICD-10-CM | POA: Diagnosis not present

## 2019-09-30 DIAGNOSIS — H16223 Keratoconjunctivitis sicca, not specified as Sjogren's, bilateral: Secondary | ICD-10-CM | POA: Diagnosis not present

## 2019-09-30 DIAGNOSIS — H0102A Squamous blepharitis right eye, upper and lower eyelids: Secondary | ICD-10-CM | POA: Diagnosis not present

## 2019-09-30 DIAGNOSIS — H0102B Squamous blepharitis left eye, upper and lower eyelids: Secondary | ICD-10-CM | POA: Diagnosis not present

## 2019-10-05 DIAGNOSIS — H16223 Keratoconjunctivitis sicca, not specified as Sjogren's, bilateral: Secondary | ICD-10-CM | POA: Diagnosis not present

## 2019-10-05 DIAGNOSIS — H2513 Age-related nuclear cataract, bilateral: Secondary | ICD-10-CM | POA: Diagnosis not present

## 2019-10-05 DIAGNOSIS — H0102A Squamous blepharitis right eye, upper and lower eyelids: Secondary | ICD-10-CM | POA: Diagnosis not present

## 2019-10-05 DIAGNOSIS — H0102B Squamous blepharitis left eye, upper and lower eyelids: Secondary | ICD-10-CM | POA: Diagnosis not present

## 2019-10-07 DIAGNOSIS — H2512 Age-related nuclear cataract, left eye: Secondary | ICD-10-CM | POA: Diagnosis not present

## 2019-12-12 DIAGNOSIS — H2511 Age-related nuclear cataract, right eye: Secondary | ICD-10-CM | POA: Diagnosis not present

## 2019-12-16 DIAGNOSIS — H268 Other specified cataract: Secondary | ICD-10-CM | POA: Diagnosis not present

## 2019-12-16 DIAGNOSIS — H2511 Age-related nuclear cataract, right eye: Secondary | ICD-10-CM | POA: Diagnosis not present

## 2020-01-17 DIAGNOSIS — Z13220 Encounter for screening for lipoid disorders: Secondary | ICD-10-CM | POA: Diagnosis not present

## 2020-01-17 DIAGNOSIS — J449 Chronic obstructive pulmonary disease, unspecified: Secondary | ICD-10-CM | POA: Diagnosis not present

## 2020-01-17 DIAGNOSIS — I10 Essential (primary) hypertension: Secondary | ICD-10-CM | POA: Diagnosis not present

## 2020-01-27 DIAGNOSIS — I10 Essential (primary) hypertension: Secondary | ICD-10-CM | POA: Diagnosis not present

## 2020-01-27 DIAGNOSIS — R6884 Jaw pain: Secondary | ICD-10-CM | POA: Diagnosis not present

## 2020-01-27 DIAGNOSIS — Z23 Encounter for immunization: Secondary | ICD-10-CM | POA: Diagnosis not present

## 2020-05-30 ENCOUNTER — Other Ambulatory Visit (HOSPITAL_COMMUNITY): Payer: Self-pay | Admitting: Family

## 2020-05-30 ENCOUNTER — Other Ambulatory Visit: Payer: Self-pay | Admitting: Family

## 2020-05-30 DIAGNOSIS — R188 Other ascites: Secondary | ICD-10-CM

## 2020-12-15 ENCOUNTER — Emergency Department (HOSPITAL_COMMUNITY): Payer: Medicare Other

## 2020-12-15 ENCOUNTER — Other Ambulatory Visit: Payer: Self-pay

## 2020-12-15 ENCOUNTER — Encounter (HOSPITAL_COMMUNITY): Payer: Self-pay

## 2020-12-15 ENCOUNTER — Emergency Department (HOSPITAL_COMMUNITY)
Admission: EM | Admit: 2020-12-15 | Discharge: 2020-12-15 | Disposition: A | Discharge status: Home / Self Care | Payer: Medicare Other | Attending: Emergency Medicine | Admitting: Emergency Medicine

## 2020-12-15 DIAGNOSIS — Z859 Personal history of malignant neoplasm, unspecified: Secondary | ICD-10-CM | POA: Insufficient documentation

## 2020-12-15 DIAGNOSIS — J449 Chronic obstructive pulmonary disease, unspecified: Secondary | ICD-10-CM | POA: Insufficient documentation

## 2020-12-15 DIAGNOSIS — M25512 Pain in left shoulder: Secondary | ICD-10-CM | POA: Insufficient documentation

## 2020-12-15 DIAGNOSIS — F17210 Nicotine dependence, cigarettes, uncomplicated: Secondary | ICD-10-CM | POA: Diagnosis not present

## 2020-12-15 DIAGNOSIS — Z7951 Long term (current) use of inhaled steroids: Secondary | ICD-10-CM | POA: Diagnosis not present

## 2020-12-15 DIAGNOSIS — M4802 Spinal stenosis, cervical region: Secondary | ICD-10-CM | POA: Diagnosis not present

## 2020-12-15 DIAGNOSIS — Z7982 Long term (current) use of aspirin: Secondary | ICD-10-CM | POA: Insufficient documentation

## 2020-12-15 LAB — COMPREHENSIVE METABOLIC PANEL
ALT: 27 U/L (ref 0–44)
AST: 14 U/L — ABNORMAL LOW (ref 15–41)
Albumin: 3.8 g/dL (ref 3.5–5.0)
Alkaline Phosphatase: 89 U/L (ref 38–126)
Anion gap: 7 (ref 5–15)
BUN: 20 mg/dL (ref 8–23)
CO2: 28 mmol/L (ref 22–32)
Calcium: 9.1 mg/dL (ref 8.9–10.3)
Chloride: 98 mmol/L (ref 98–111)
Creatinine, Ser: 1.19 mg/dL (ref 0.61–1.24)
GFR, Estimated: >60 mL/min (ref >60)
Glucose, Bld: 97 mg/dL (ref 70–99)
Potassium: 4.2 mmol/L (ref 3.5–5.1)
Sodium: 133 mmol/L — ABNORMAL LOW (ref 135–145)
Total Bilirubin: 0.9 mg/dL (ref 0.3–1.2)
Total Protein: 6.7 g/dL (ref 6.5–8.1)

## 2020-12-15 LAB — SALICYLATE LEVEL: Salicylate Lvl: <7 mg/dL — ABNORMAL LOW (ref 7.0–30.0)

## 2020-12-15 IMAGING — CT CT CERVICAL SPINE W/O CM
3 of 4 series · 12 of 33 positions shown, 14 images · non-contrast
Comparison: CT chest [DATE].

CLINICAL DATA: Cervical radiculopathy, no red flags

EXAM:
CT CERVICAL SPINE WITHOUT CONTRAST
TECHNIQUE: Multidetector CT imaging of the cervical spine was performed without
intravenous contrast. Multiplanar CT image reconstructions were also
generated.

[Series 5: sag bone · sagittal · 0.31mm/px · 5 of 74 slices shown, 6 images]
[im 25/74  bone]
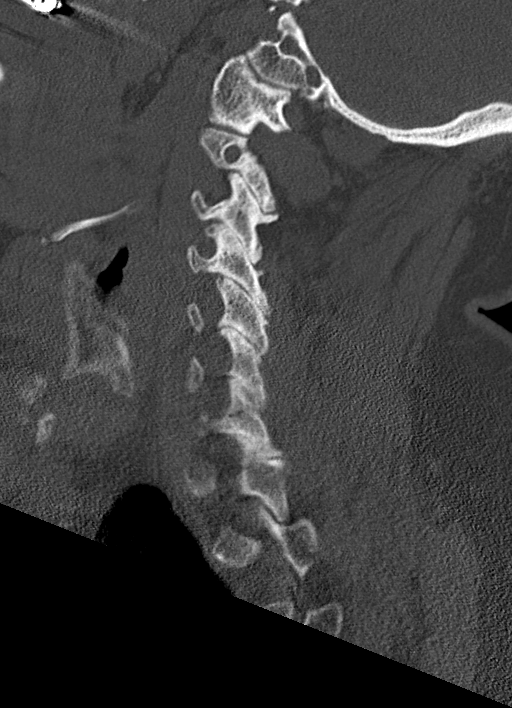
[im 31/74  bone]
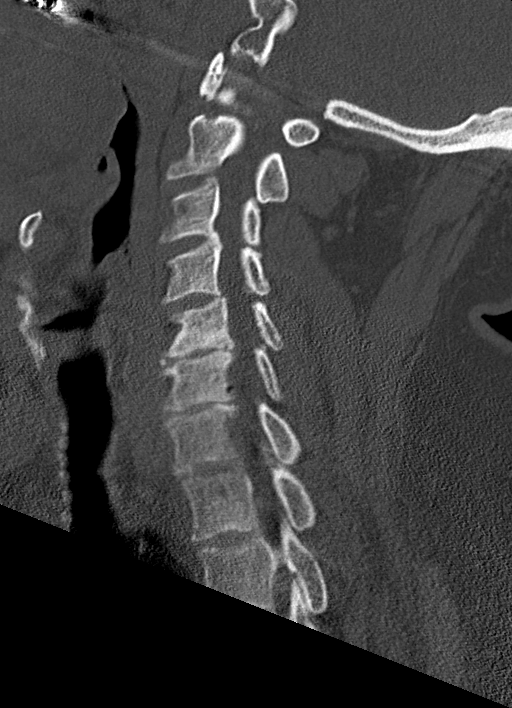
[im 37/74  soft-tissue]
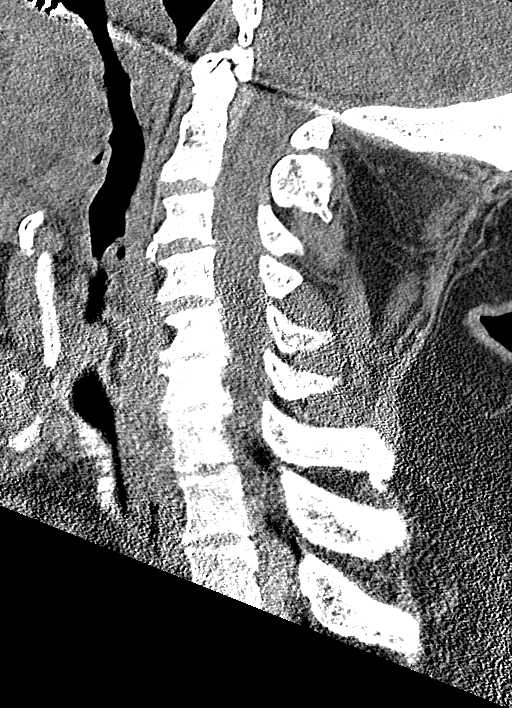
[im 37/74  bone]
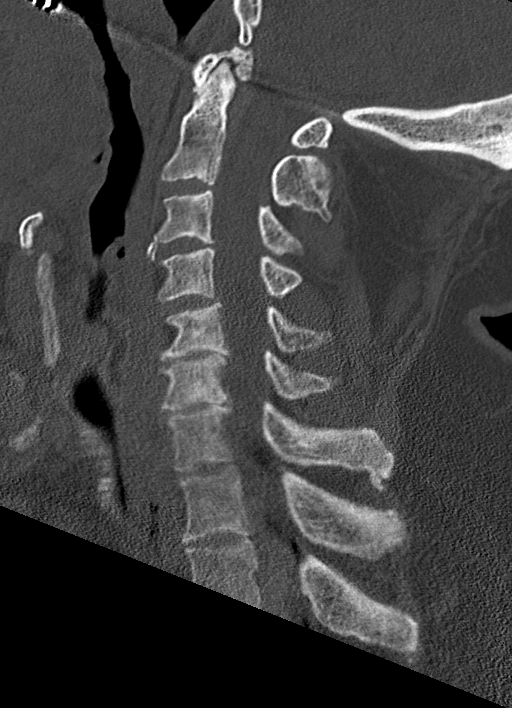
[im 43/74  bone]
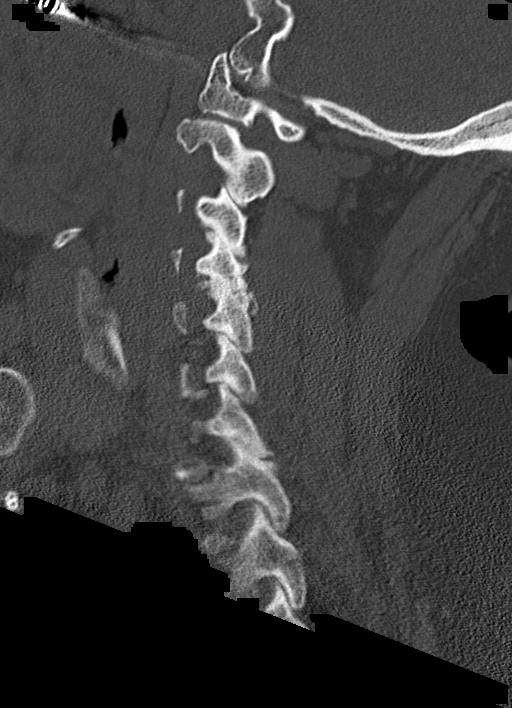
[im 49/74  bone]
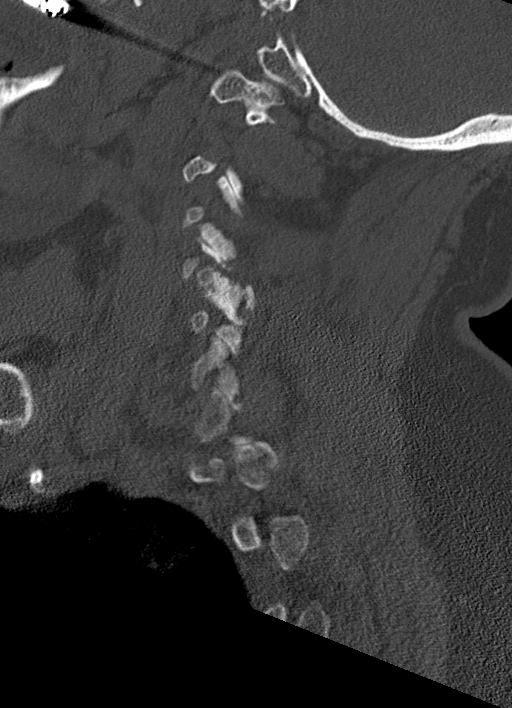

[Series 6: cor bone · coronal · 0.29mm/px · 3 of 79 slices shown]
[im 20/79  bone]
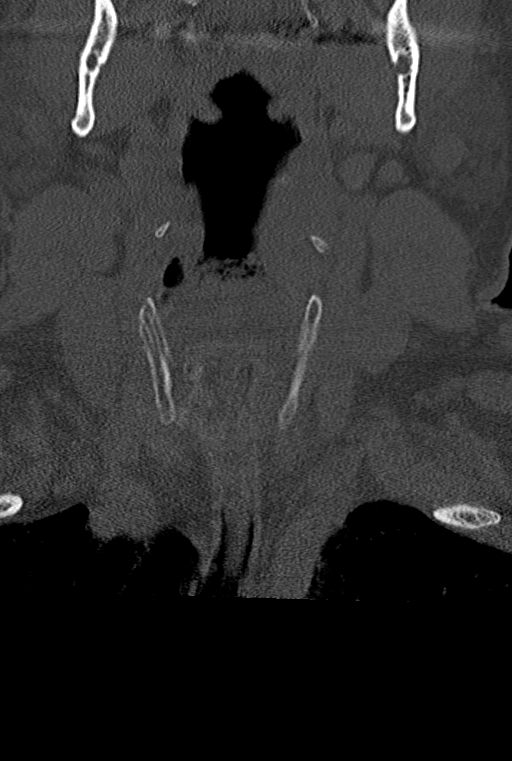
[im 33/79  bone]
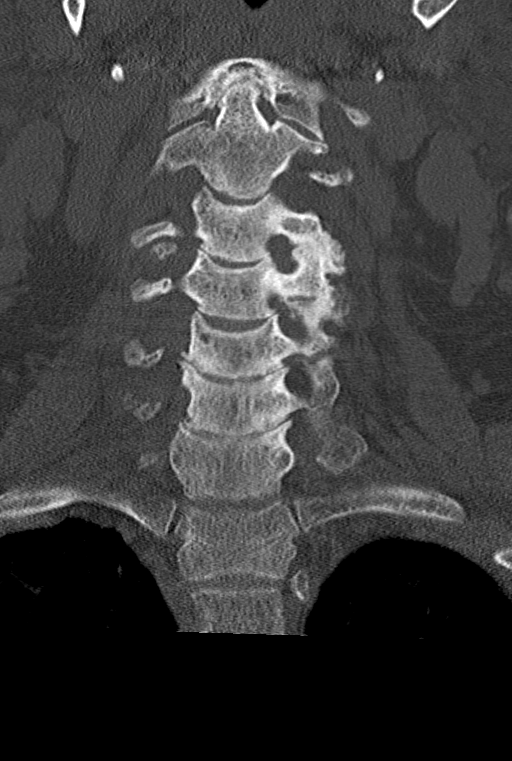
[im 46/79  bone]
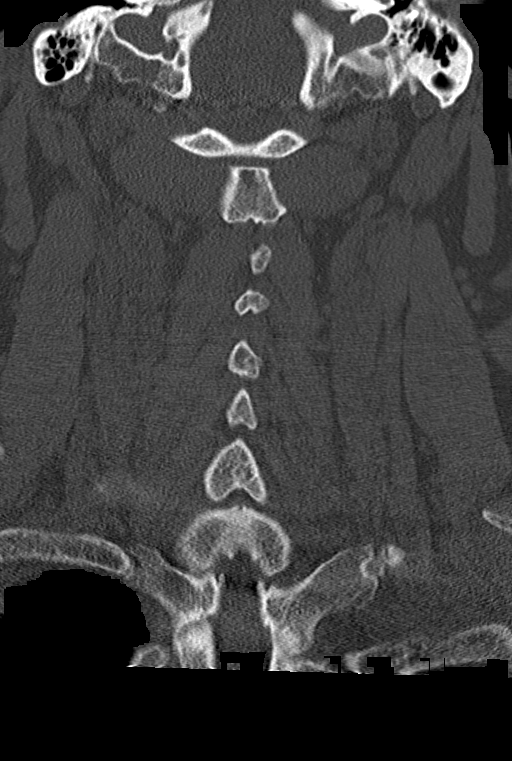

[Series 7: orthogonal axials · axial · 0.21mm/px · z∈[-183,-67]mm · 4 of 94 slices shown, 5 images]
[im 16/94  soft-tissue]
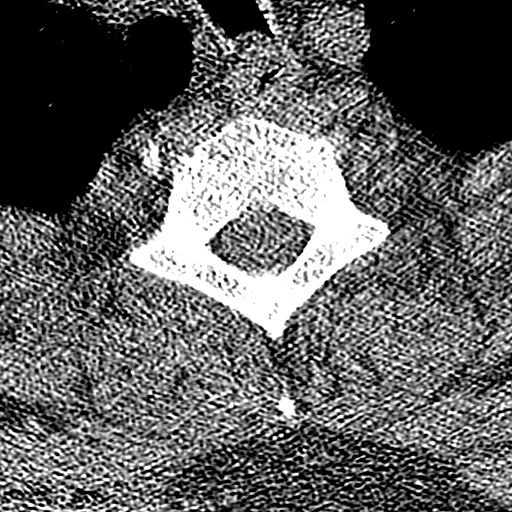
[im 16/94  bone]
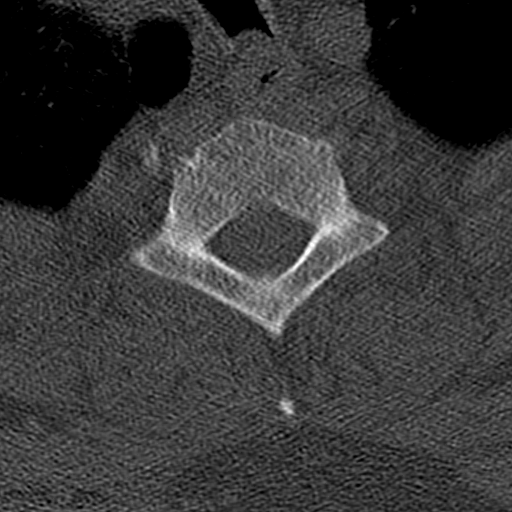
[im 32/94  bone]
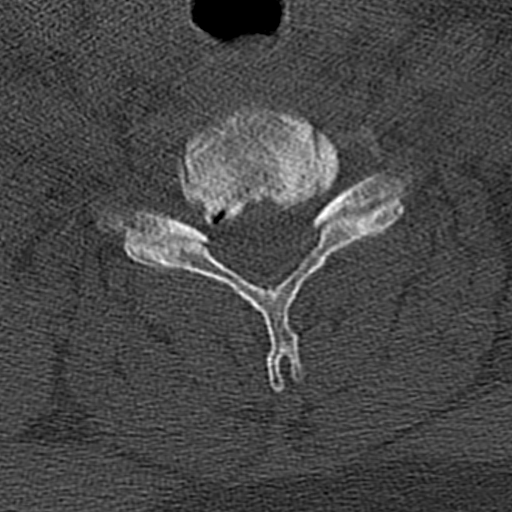
[im 63/94  bone]
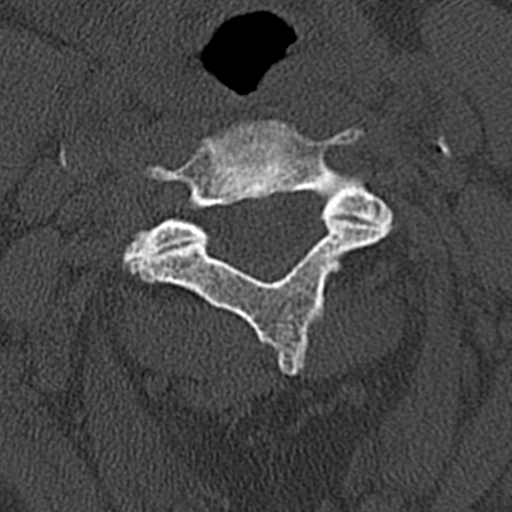
[im 78/94  bone]
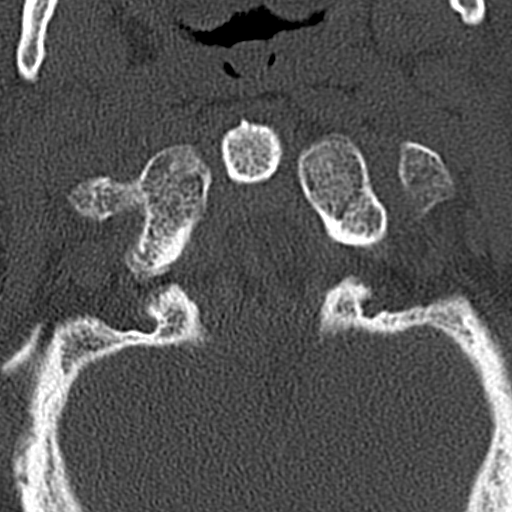

[12 of 33 positions shown; findings below may reference images not displayed]

FINDINGS: Alignment: Approximately 2 mm of anterolisthesis C4 on C5. Trace
anterolisthesis of C3 on C4.

Skull base and vertebrae: No evidence of acute fracture. Vertebral
body heights are maintained.

Soft tissues and spinal canal: No prevertebral fluid or swelling. No
visible canal hematoma. Approximately 7 x 5 x 10 mm calcified lesion
in the posterolateral left canal at C4-5 level (series 4, image 59;
series 5, image 39)

Disc levels: Multilevel facet arthropathy with severe left facet
arthropathy at C4-C5 and potentially severe left foraminal stenosis
at this level. Potentially moderate foraminal stenosis at C5-C6 and
moderate to severe bilateral foraminal stenosis at C6-C7. Multilevel
posterior disc osteophyte complexes without high-grade bony canal
stenosis.

Upper chest: Motion limited evaluation lung apices with paraseptal
emphysema and partially imaged right upper lobe pulmonary nodule,
further characterized on prior CT chest from [DATE].
IMPRESSION: 1. Multilevel facet arthropathy, severe on the left at C4-C5 with
potentially severe left foraminal stenosis at this level. Also,
probably moderate to severe bony foraminal stenosis at C6-C7 and
moderate foraminal stenosis at C5-C6.
2. Calcified 10 mm lesion within the posterolateral left canal at
C4-C5. This could represent degenerative change or a calcified mass
(such as a meningioma).
3. Given the patient's reported left cervical radiculopathy and the
above findings, recommend MRI of the cervical spine with contrast to
better characterize.
4. Motion limited evaluation lung apices with paraseptal emphysema
and partially imaged right upper lobe pulmonary nodule, further
characterized on prior CT chest from [DATE].

## 2020-12-15 MED ORDER — DEXAMETHASONE SODIUM PHOSPHATE 10 MG/ML IJ SOLN
10.0000 mg | Freq: Once | INTRAMUSCULAR | Status: AC
  Administered 2020-12-15: 10 mg via INTRAVENOUS
  Filled 2020-12-15: qty 1

## 2020-12-15 MED ORDER — PREDNISONE 10 MG (21) PO TBPK: ORAL_TABLET | Freq: Every day | ORAL | 0 refills | Status: DC

## 2020-12-15 MED ORDER — ONDANSETRON HCL 4 MG/2ML IJ SOLN
4.0000 mg | Freq: Once | INTRAMUSCULAR | Status: AC
  Administered 2020-12-15: 4 mg via INTRAVENOUS
  Filled 2020-12-15: qty 2

## 2020-12-15 MED ORDER — MORPHINE SULFATE (PF) 4 MG/ML IV SOLN
4.0000 mg | Freq: Once | INTRAVENOUS | Status: AC
  Administered 2020-12-15: 4 mg via INTRAVENOUS
  Filled 2020-12-15: qty 1

## 2020-12-15 MED ORDER — HYDROCODONE-ACETAMINOPHEN 5-325 MG PO TABS: 1.0000 | ORAL_TABLET | ORAL | 0 refills | Status: DC | PRN

## 2020-12-15 NOTE — ED Provider Notes (Signed)
[facility] EMERGENCY DEPARTMENT Provider Note   CSN: [id number] Arrival date & time: 12/15/20  1116     History Chief Complaint  Patient presents with   Shoulder Pain    [name] is a 65 y.o. male.  Pt presents to the ED today with left shoulder pain.  Pt said it has been hurting for a few weeks.  He did take a medrol dose pack, but it did not help.  The pt said he tried calling his doctor, but could not get an appt.  Pt has no numbness.      Past Medical History:  Diagnosis Date   Anemia    IRON 2005   Anxiety    Arthritis    LOWER LUMBAR STENOSIS , ARTHRITIS HANDS AND SHOULDERS   Asthma    Cancer ([place]) 2005   COLON SURGERY AND CHEMO   Complication of anesthesia    WOKE UP DURING PORT PLACEMENT AND ENDOSCOPY, SLOW TO AWAKEN FROM COLON RESECTION 2010   COPD (chronic obstructive pulmonary disease) (HCC)    Dyspnea    WITH EXERTION   GERD (gastroesophageal reflux disease)    Hydrocele, right    Neuropathy    FINGERS   Pneumonia 2016 LAST    X 3 EPISODES IN PAST   Vitamin D deficiency     There are no problems to display for this patient.   Past Surgical History:  Procedure Laterality Date   COLON RESECTION  2010   HERNIA REPAIR     HYDROCELE EXCISION Right 12/16/2016   Procedure: HYDROCELECTOMY ADULT RGHT;  Surgeon: Lucas Mallow, MD;  Location[facility];  Service: Urology;  Laterality: Right;   PORT PLACEMENT     LEFT CHEST   UPPER GI ENDOSCOPY         No family history on file.  Social History   Tobacco Use   Smoking status: Every Day    Packs/day: 1.00    Years: 45.00    Pack years: 45.00    Types: Cigarettes   Smokeless tobacco: Never  Vaping Use   Vaping Use: Never used  Substance Use Topics   Alcohol use: No   Drug use: No    Home Medications Prior to Admission medications   Medication Sig Start Date End Date Taking? Authorizing Provider  HYDROcodone-acetaminophen (NORCO/VICODIN) 5-325 MG tablet  Take 1 tablet by mouth every 4 (four) hours as needed. 12/15/20  Yes Isla Pence, MD  predniSONE (STERAPRED UNI-PAK 21 TAB) 10 MG (21) TBPK tablet Take by mouth daily. Take 6 tabs by mouth daily  for 2 days, then 5 tabs for 2 days, then 4 tabs for 2 days, then 3 tabs for 2 days, 2 tabs for 2 days, then 1 tab by mouth daily for 2 days 12/15/20  Yes Isla Pence, MD  albuterol (PROVENTIL HFA;VENTOLIN HFA) 108 (90 Base) MCG/ACT inhaler Inhale 1-2 puffs into the lungs every 6 (six) hours as needed for wheezing or shortness of breath.    [provider]  aspirin EC 325 MG tablet Take 1,300 mg by mouth 2 (two) times daily as needed for mild pain or moderate pain.    [provider]  calcium carbonate (TUMS - DOSED IN MG ELEMENTAL CALCIUM) 500 MG chewable tablet Chew 1 tablet as needed by mouth for indigestion or heartburn.    [provider]  ipratropium-albuterol (DUONEB) 0.5-2.5 (3) MG/3ML SOLN Take 3 mLs by nebulization every 6 (six) hours as needed (  for shortness of breath).    [provider]  nystatin (MYCOSTATIN/NYSTOP) powder Apply topically 4 (four) times daily. Until three days after rash improves 09/09/16   Mesner, [name], MD    Allergies    Patient has no known allergies.  Review of Systems   Review of Systems  Musculoskeletal:        Left arm pain  All other systems reviewed and are negative.  Physical Exam Updated Vital Signs BP 126/63   Pulse 89   Temp 97.9 F (36.6 C) (Oral)   Resp 14   Ht 5\' 9"  (1.753 m)   Wt 119.7 kg   SpO2 93%   BMI 38.99 kg/m   Physical Exam Vitals and nursing note reviewed.  Constitutional:      Appearance: Normal appearance. He is obese.  HENT:     Head: Normocephalic and atraumatic.     Right Ear: External ear normal.     Left Ear: External ear normal.     Nose: Nose normal.     Mouth/Throat:     Mouth: Mucous membranes are moist.     Pharynx: Oropharynx is clear.  Eyes:     Extraocular Movements:  Extraocular movements intact.     Conjunctiva/sclera: Conjunctivae normal.     Pupils: Pupils are equal, round, and reactive to light.  Cardiovascular:     Rate and Rhythm: Normal rate and regular rhythm.     Pulses: Normal pulses.     Heart sounds: Normal heart sounds.  Pulmonary:     Effort: Pulmonary effort is normal.     Breath sounds: Normal breath sounds.  Abdominal:     General: Abdomen is flat. Bowel sounds are normal.     Palpations: Abdomen is soft.  Musculoskeletal:        General: Normal range of motion.     Cervical back: Normal range of motion and neck supple.  Skin:    General: Skin is warm.     Capillary Refill: Capillary refill takes less than 2 seconds.  Neurological:     General: No focal deficit present.     Mental Status: He is alert and oriented to person, place, and time.  Psychiatric:        Mood and Affect: Mood normal.        Behavior: Behavior normal.    ED Results / Procedures / Treatments   Labs (all labs ordered are listed, but only abnormal results are displayed) Labs Reviewed  COMPREHENSIVE METABOLIC PANEL - Abnormal; Notable for the following components:      Result Value   Sodium 133 (*)    AST 14 (*)    All other components within normal limits  SALICYLATE LEVEL - Abnormal; Notable for the following components:   Salicylate Lvl <5.6 (*)    All other components within normal limits    EKG None  Radiology CT Cervical Spine Wo Contrast  Result Date: 12/15/2020 CLINICAL DATA:  Cervical radiculopathy, no red flags EXAM: CT CERVICAL SPINE WITHOUT CONTRAST TECHNIQUE: Multidetector CT imaging of the cervical spine was performed without intravenous contrast. Multiplanar CT image reconstructions were also generated. COMPARISON:  CT chest 09/09/2016. FINDINGS: Alignment: Approximately 2 mm of anterolisthesis C4 on C5. Trace anterolisthesis of C3 on C4. Skull base and vertebrae: No evidence of acute fracture. Vertebral body heights are  maintained. Soft tissues and spinal canal: No prevertebral fluid or swelling. No visible canal hematoma. Approximately 7 x 5 x 10 mm calcified lesion in the  posterolateral left canal at C4-5 level (series 4, image 59; series 5, image 39) Disc levels: Multilevel facet arthropathy with severe left facet arthropathy at C4-C5 and potentially severe left foraminal stenosis at this level. Potentially moderate foraminal stenosis at C5-C6 and moderate to severe bilateral foraminal stenosis at C6-C7. Multilevel posterior disc osteophyte complexes without high-grade bony canal stenosis. Upper chest: Motion limited evaluation lung apices with paraseptal emphysema and partially imaged right upper lobe pulmonary nodule, further characterized on prior CT chest from 09/09/2016. IMPRESSION: 1. Multilevel facet arthropathy, severe on the left at C4-C5 with potentially severe left foraminal stenosis at this level. Also, probably moderate to severe bony foraminal stenosis at C6-C7 and moderate foraminal stenosis at C5-C6. 2. Calcified 10 mm lesion within the posterolateral left canal at C4-C5. This could represent degenerative change or a calcified mass (such as a meningioma). 3. Given the patient's reported left cervical radiculopathy and the above findings, recommend MRI of the cervical spine with contrast to better characterize. 4. Motion limited evaluation lung apices with paraseptal emphysema and partially imaged right upper lobe pulmonary nodule, further characterized on prior CT chest from 09/09/2016. Electronically Signed   By: Margaretha Sheffield M.D.   On: 12/15/2020 13:46    Procedures Procedures   Medications Ordered in ED Medications  dexamethasone (DECADRON) injection 10 mg (10 mg Intravenous Given 12/15/20 1236)  morphine 4 MG/ML injection 4 mg (4 mg Intravenous Given 12/15/20 1239)  ondansetron (ZOFRAN) injection 4 mg (4 mg Intravenous Given 12/15/20 1234)    ED Course  I have reviewed the triage vital signs  and the nursing notes.  Pertinent labs & imaging results that were available during my care of the patient were reviewed by me and considered in my medical decision making (see chart for details).    MDM Rules/Calculators/A&P                           CT with severe spinal stenosis.  Rads recommended a MRI which was ordered as an outpatient.  Pt's pain is much improved.  He is to f/u with NS.  He is to return if worse.  Final Clinical Impression(s) / ED Diagnoses Final diagnoses:  Cervical spinal stenosis    Rx / DC Orders ED Discharge Orders          Ordered    MR CERVICAL SPINE W WO CONTRAST        12/15/20 1401    HYDROcodone-acetaminophen (NORCO/VICODIN) 5-325 MG tablet  Every 4 hours PRN        12/15/20 1402    predniSONE (STERAPRED UNI-PAK 21 TAB) 10 MG (21) TBPK tablet  Daily        12/15/20 1402             Isla Pence, MD 12/15/20 1407

## 2020-12-15 NOTE — ED Triage Notes (Signed)
Pt presents to ED with complaints of left shoulder pain radiating down arm. Pt states has been going on for a couple of weeks now. Pt states he has been more short of breath than normally.

## 2020-12-29 ENCOUNTER — Encounter (HOSPITAL_COMMUNITY): Payer: Self-pay | Admitting: *Deleted

## 2020-12-29 ENCOUNTER — Emergency Department (HOSPITAL_COMMUNITY): Payer: Medicare Other

## 2020-12-29 ENCOUNTER — Emergency Department (HOSPITAL_COMMUNITY)
Admission: EM | Admit: 2020-12-29 | Discharge: 2020-12-29 | Disposition: A | Discharge status: Home / Self Care | Payer: Medicare Other | Attending: Emergency Medicine | Admitting: Emergency Medicine

## 2020-12-29 DIAGNOSIS — R630 Anorexia: Secondary | ICD-10-CM | POA: Insufficient documentation

## 2020-12-29 DIAGNOSIS — M542 Cervicalgia: Secondary | ICD-10-CM

## 2020-12-29 DIAGNOSIS — Z20822 Contact with and (suspected) exposure to covid-19: Secondary | ICD-10-CM | POA: Diagnosis not present

## 2020-12-29 DIAGNOSIS — N179 Acute kidney failure, unspecified: Secondary | ICD-10-CM | POA: Diagnosis not present

## 2020-12-29 DIAGNOSIS — F17210 Nicotine dependence, cigarettes, uncomplicated: Secondary | ICD-10-CM | POA: Diagnosis not present

## 2020-12-29 DIAGNOSIS — R5381 Other malaise: Secondary | ICD-10-CM | POA: Insufficient documentation

## 2020-12-29 DIAGNOSIS — Z7951 Long term (current) use of inhaled steroids: Secondary | ICD-10-CM | POA: Insufficient documentation

## 2020-12-29 DIAGNOSIS — R0602 Shortness of breath: Secondary | ICD-10-CM

## 2020-12-29 DIAGNOSIS — J449 Chronic obstructive pulmonary disease, unspecified: Secondary | ICD-10-CM | POA: Insufficient documentation

## 2020-12-29 DIAGNOSIS — M5412 Radiculopathy, cervical region: Secondary | ICD-10-CM | POA: Insufficient documentation

## 2020-12-29 DIAGNOSIS — M5410 Radiculopathy, site unspecified: Secondary | ICD-10-CM

## 2020-12-29 LAB — LACTIC ACID, PLASMA
Lactic Acid, Venous: 1.8 mmol/L (ref 0.5–1.9)
Lactic Acid, Venous: 1.6 mmol/L (ref 0.5–1.9)

## 2020-12-29 LAB — URINALYSIS, ROUTINE W REFLEX MICROSCOPIC
Bilirubin Urine: NEGATIVE
Glucose, UA: NEGATIVE mg/dL
Hgb urine dipstick: NEGATIVE
Ketones, ur: NEGATIVE mg/dL
Leukocytes,Ua: NEGATIVE
Nitrite: NEGATIVE
Protein, ur: NEGATIVE mg/dL
Specific Gravity, Urine: <1.005 — ABNORMAL LOW (ref 1.005–1.030)
pH: 5.5 (ref 5.0–8.0)

## 2020-12-29 LAB — CBC WITH DIFFERENTIAL/PLATELET
Abs Immature Granulocytes: 0.07 10*3/uL (ref 0.00–0.07)
Basophils Absolute: 0 10*3/uL (ref 0.0–0.1)
Basophils Relative: 0 %
Eosinophils Absolute: 0.2 10*3/uL (ref 0.0–0.5)
Eosinophils Relative: 1 %
HCT: 40.1 % (ref 39.0–52.0)
Hemoglobin: 13.1 g/dL (ref 13.0–17.0)
Immature Granulocytes: 1 %
Lymphocytes Relative: 8 %
Lymphs Abs: 1.2 10*3/uL (ref 0.7–4.0)
MCH: 32.7 pg (ref 26.0–34.0)
MCHC: 32.7 g/dL (ref 30.0–36.0)
MCV: 100 fL (ref 80.0–100.0)
Monocytes Absolute: 0.9 10*3/uL (ref 0.1–1.0)
Monocytes Relative: 6 %
Neutro Abs: 12.3 10*3/uL — ABNORMAL HIGH (ref 1.7–7.7)
Neutrophils Relative %: 84 %
Platelets: 206 10*3/uL (ref 150–400)
RBC: 4.01 MIL/uL — ABNORMAL LOW (ref 4.22–5.81)
RDW: 13.7 % (ref 11.5–15.5)
WBC: 14.7 10*3/uL — ABNORMAL HIGH (ref 4.0–10.5)
nRBC: 0 % (ref 0.0–0.2)

## 2020-12-29 LAB — COMPREHENSIVE METABOLIC PANEL
ALT: 28 U/L (ref 0–44)
AST: 12 U/L — ABNORMAL LOW (ref 15–41)
Albumin: 3.1 g/dL — ABNORMAL LOW (ref 3.5–5.0)
Alkaline Phosphatase: 65 U/L (ref 38–126)
Anion gap: 8 (ref 5–15)
BUN: 32 mg/dL — ABNORMAL HIGH (ref 8–23)
CO2: 32 mmol/L (ref 22–32)
Calcium: 8.5 mg/dL — ABNORMAL LOW (ref 8.9–10.3)
Chloride: 93 mmol/L — ABNORMAL LOW (ref 98–111)
Creatinine, Ser: 2.01 mg/dL — ABNORMAL HIGH (ref 0.61–1.24)
GFR, Estimated: 36 mL/min — ABNORMAL LOW (ref >60)
Glucose, Bld: 71 mg/dL (ref 70–99)
Potassium: 3.7 mmol/L (ref 3.5–5.1)
Sodium: 133 mmol/L — ABNORMAL LOW (ref 135–145)
Total Bilirubin: <0.1 mg/dL — ABNORMAL LOW (ref 0.3–1.2)
Total Protein: 5.6 g/dL — ABNORMAL LOW (ref 6.5–8.1)

## 2020-12-29 LAB — PROTIME-INR
INR: 0.9 (ref 0.8–1.2)
Prothrombin Time: 11.8 seconds (ref 11.4–15.2)

## 2020-12-29 LAB — APTT: aPTT: 25 seconds (ref 24–36)

## 2020-12-29 LAB — RESP PANEL BY RT-PCR (FLU A&B, COVID) ARPGX2
Influenza A by PCR: NEGATIVE
Influenza B by PCR: NEGATIVE
SARS Coronavirus 2 by RT PCR: NEGATIVE

## 2020-12-29 IMAGING — MR MR CERVICAL SPINE WO/W CM
4 of 8 series · 20 of 48 positions shown · IV contrast (gadavist)
Comparison: CT cervical spine [DATE]

CLINICAL DATA: Cervical radiculopathy.  Spinal stenosis.

EXAM:
MRI CERVICAL SPINE WITHOUT AND WITH CONTRAST
TECHNIQUE: Multiplanar and multiecho pulse sequences of the cervical spine, to
include the craniocervical junction and cervicothoracic junction,
were obtained without and with intravenous contrast.
CONTRAST:  10mL GADAVIST GADOBUTROL 1 MMOL/ML IV SOLN

[Series 7: STIR · sagittal · 3.0mm · 0.86mm/px · 3 of 15 slices shown]
[im 1/15]
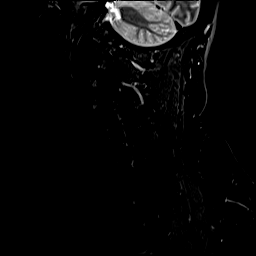
[im 8/15]
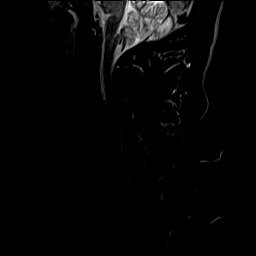
[im 15/15]
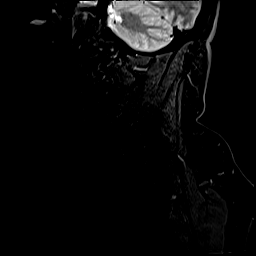

[Series 9: T1 · axial · non-contrast · 3.0mm · 0.35mm/px · z∈[-47,+66]mm · 9 of 40 slices shown]
[im 1/40]
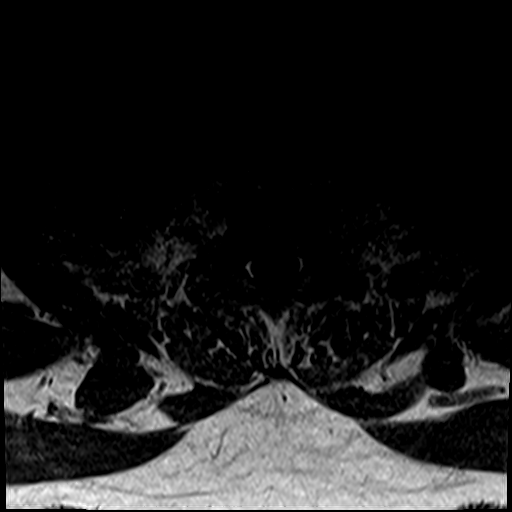
[im 5/40]
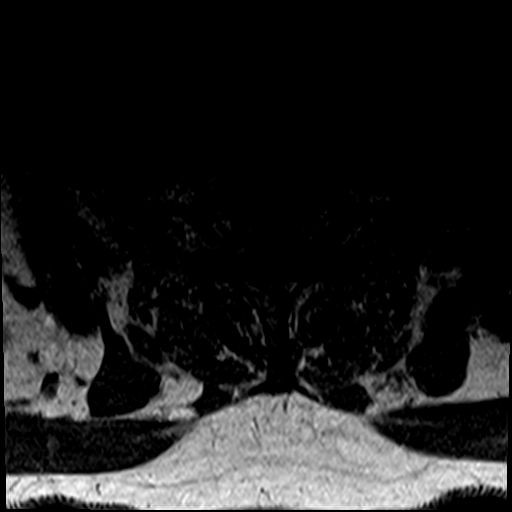
[im 10/40]
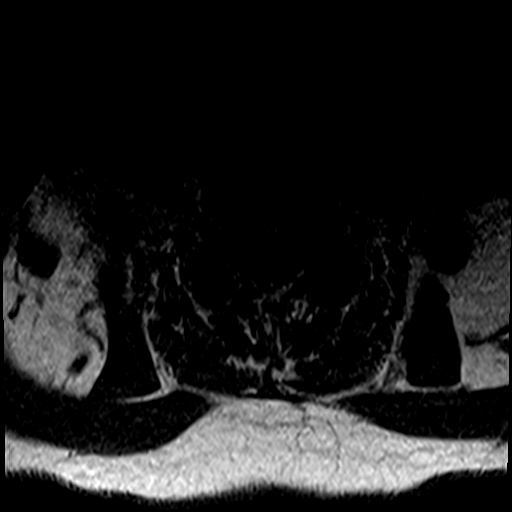
[im 15/40]
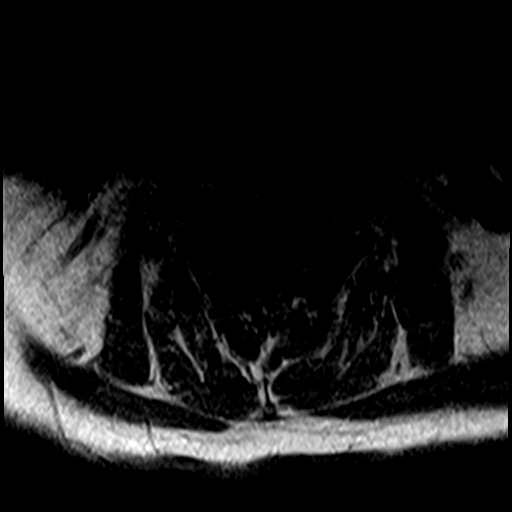
[im 20/40]
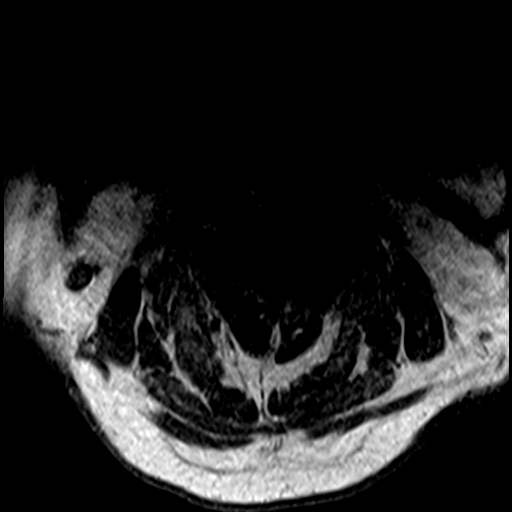
[im 25/40]
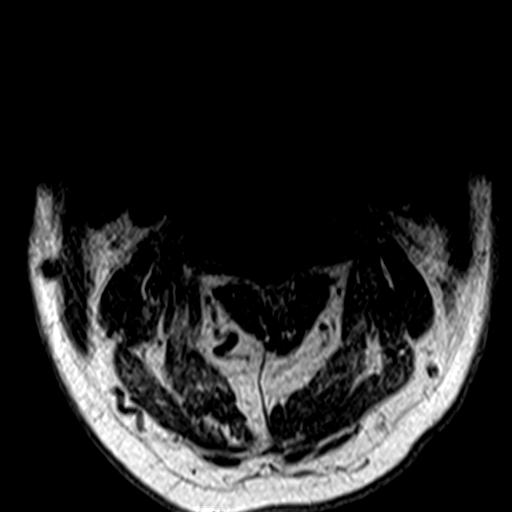
[im 30/40]
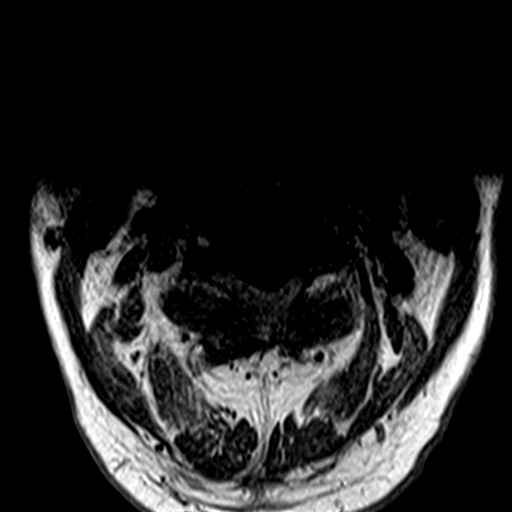
[im 35/40]
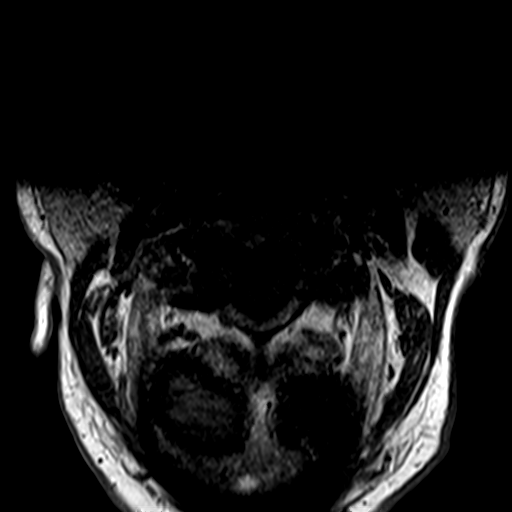
[im 40/40]
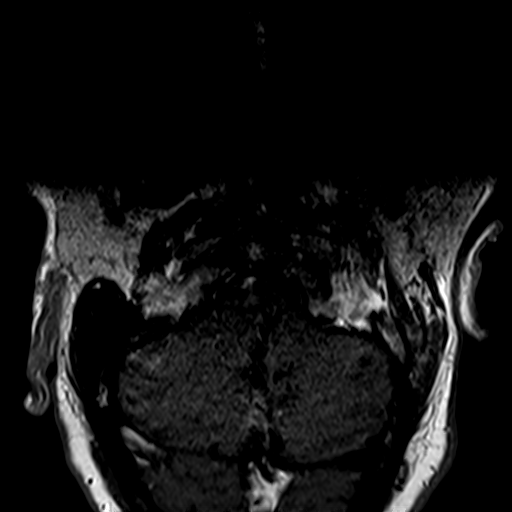

[Series 15: T1 fat-sat post-contrast · sagittal · 3.0mm · 0.43mm/px · 3 of 15 slices shown]
[im 1/15]
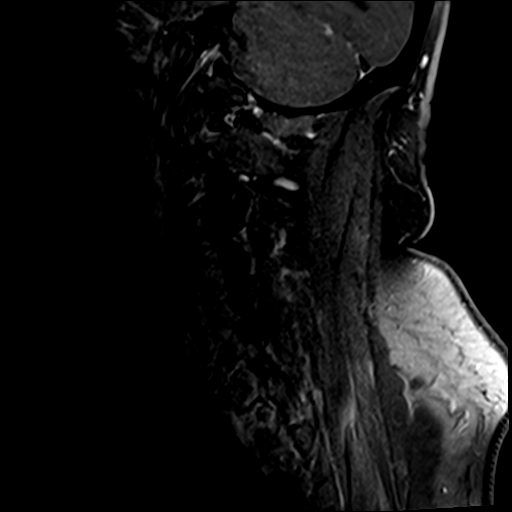
[im 8/15]
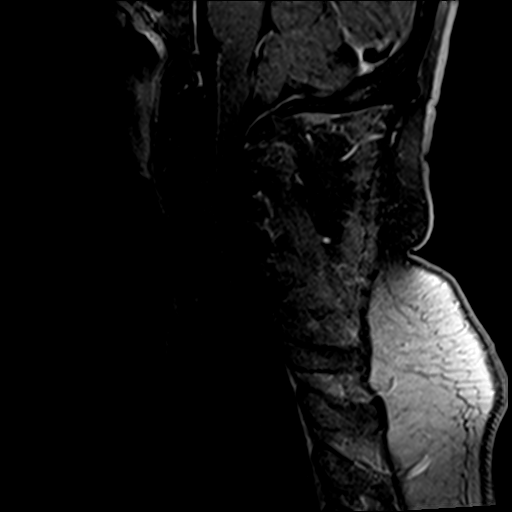
[im 15/15]
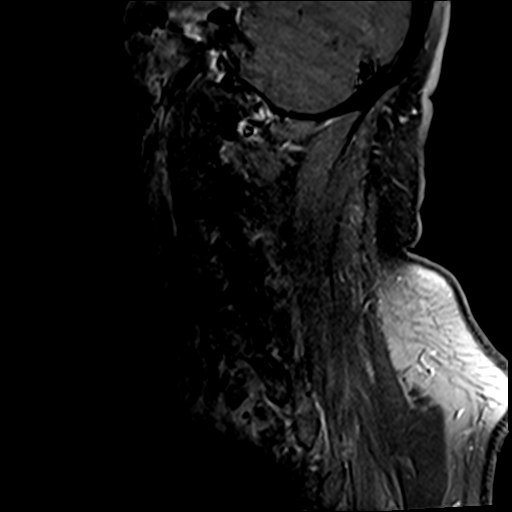

[Series 16: T1 post-contrast · axial · 3.0mm · 0.35mm/px · z∈[-61,+38]mm · 5 of 40 slices shown]
[im 1/40]
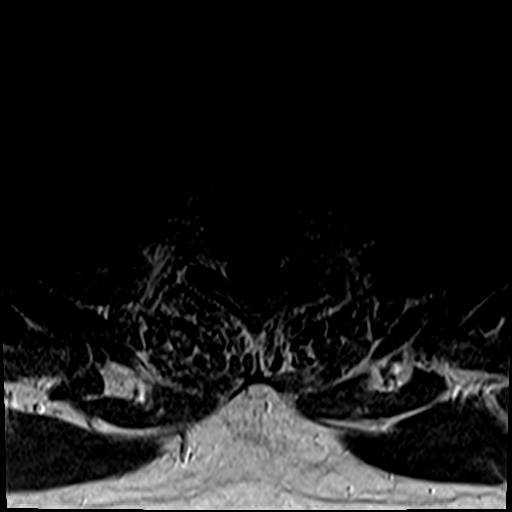
[im 5/40]
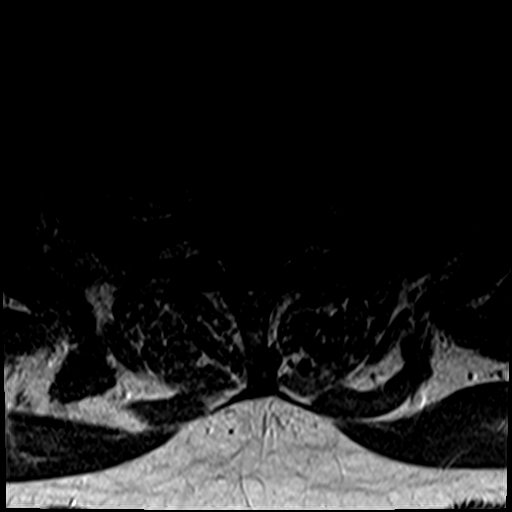
[im 10/40]
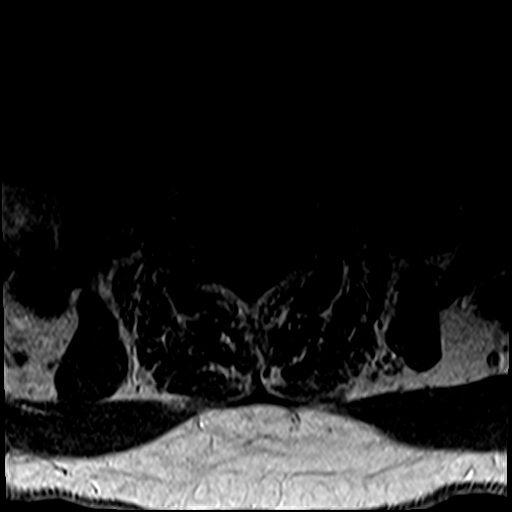
[im 20/40]
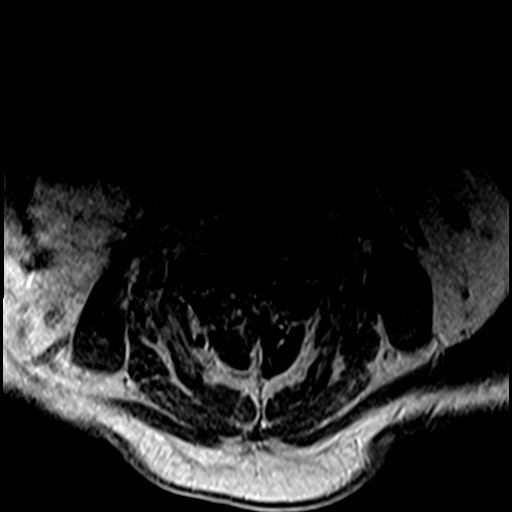
[im 35/40]
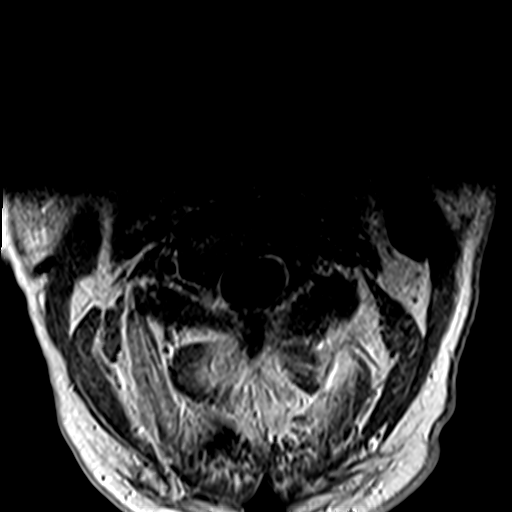

[20 of 48 positions shown; findings below may reference images not displayed]

FINDINGS: Alignment: Mild anterolisthesis C4-5

Vertebrae: No vertebral body fracture or mass.

Cord: Cord evaluation is not well evaluated due to significant
motion on the study.

There is a calcified lesion in the spinal canal on the left at C4-5.
This measures approximately 5 x 7 x 10 mm and is noted on the recent
CT. This is located medial to the degenerated facet. This is very
low signal on T1 and T2 and does not enhance. This is contributing
to left foraminal encroachment.

Posterior Fossa, vertebral arteries, paraspinal tissues: Negative

Disc levels:

C2-3: Right facet degeneration.  Negative for stenosis

C3-4: Mild foraminal narrowing bilaterally due to uncinate spurring
and facet hypertrophy

C4-5: Disc degeneration and facet degeneration. Left foraminal
encroachment due to spurring. Calcified mass in the left lateral
spinal canal as described above contributes to left foraminal
encroachment.

C5-6: Disc degeneration and spondylosis. Mild spinal stenosis and
moderate foraminal stenosis bilaterally.

C6-7: Disc degeneration with diffuse uncinate spurring. Mild spinal
stenosis and moderate foraminal stenosis bilaterally

C7-T1: Bilateral facet degeneration. Mild foraminal narrowing
bilaterally.
IMPRESSION: 1. Image quality degraded by extensive motion
2. Multilevel spondylosis in the cervical spine
3. Calcified lesion in the left lateral spinal canal at C4-5 as
noted on CT. This does not enhance. Most meningiomas will show
enhancement. This could be a degenerative calcification possibly
related to calcified synovial cyst from the left-sided facet joint
which is significantly degenerated.

## 2020-12-29 IMAGING — DX DG CHEST 1V PORT SAME DAY
1 series · 1 of 1 positions shown · non-contrast
Comparison: Previous studies including the examination done earlier
today

CLINICAL DATA: Shortness of breath

EXAM:
PORTABLE CHEST 1 VIEW

[chest ap]
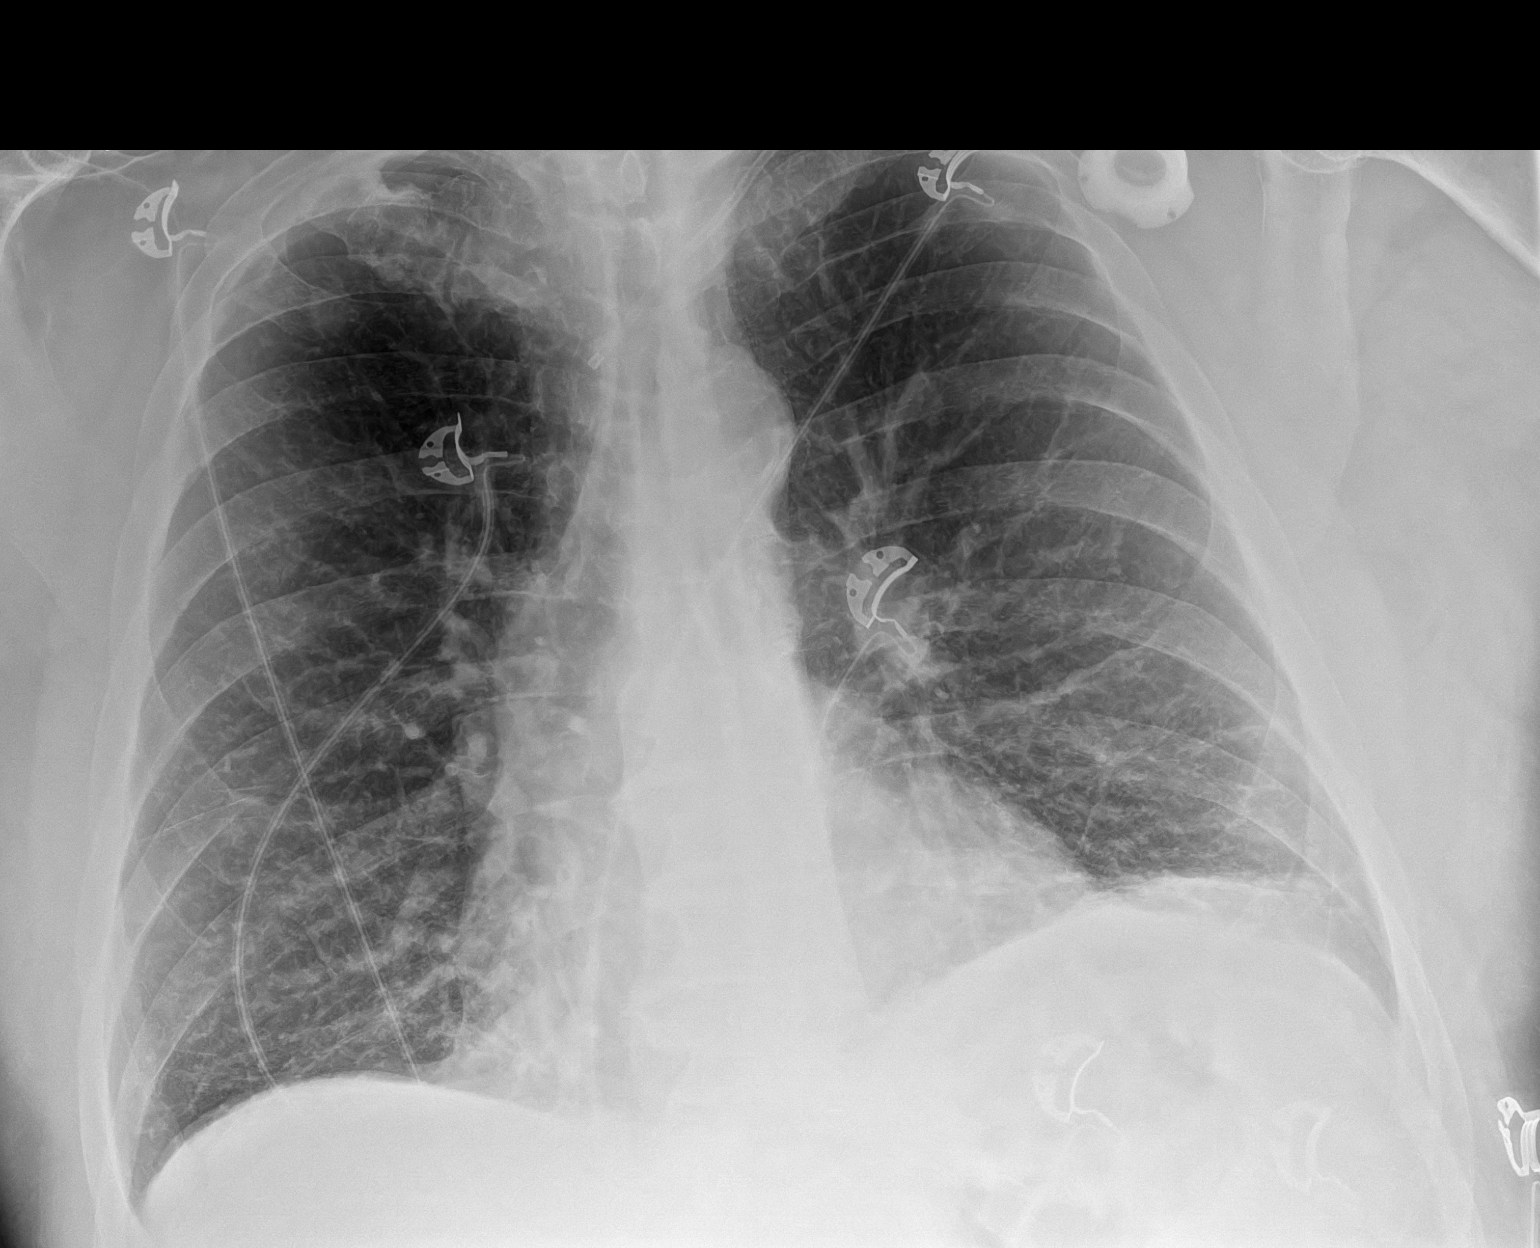

[1 of 1 positions shown; findings below may reference images not displayed]

FINDINGS: Transverse diameter of heart is in the upper limits of normal.
Apparent shift of mediastinum to the right may be due to rotation.
Increased interstitial markings are seen in both lower lung fields
suggesting scarring and possibly superimposed interstitial
pneumonia. There is no focal consolidation. There is no significant
pleural effusion or pneumothorax. There is an ill-defined 1.3 cm
radiopacity in the right upper lung field overlying the anterior
right second rib which has not changed. This may suggest scarring
from previous inflammation or parenchymal nodule such as granuloma
or neoplasm. A similar sized nodule was seen in the right upper lung
fields in the CT chest done on [DATE].
IMPRESSION: Increased interstitial markings are seen in the lower lung fields,
more so on the left side suggesting interstitial pneumonia and
possibly underlying scarring. There is no focal pulmonary
consolidation. There is no pleural effusion or pneumothorax.

There is an ill-defined 1.3 cm nodular density in the right upper
lung fields with no significant change

## 2020-12-29 MED ORDER — LACTATED RINGERS IV BOLUS (SEPSIS)
1000.0000 mL | Freq: Once | INTRAVENOUS | Status: AC
  Administered 2020-12-29: 1000 mL via INTRAVENOUS

## 2020-12-29 MED ORDER — SODIUM CHLORIDE 0.9 % IV SOLN
2.0000 g | Freq: Once | INTRAVENOUS | Status: AC
  Administered 2020-12-29: 2 g via INTRAVENOUS
  Filled 2020-12-29: qty 2

## 2020-12-29 MED ORDER — OXYCODONE-ACETAMINOPHEN 5-325 MG PO TABS: 1.0000 | ORAL_TABLET | Freq: Four times a day (QID) | ORAL | 0 refills | Status: AC | PRN

## 2020-12-29 MED ORDER — FENTANYL CITRATE PF 50 MCG/ML IJ SOSY
50.0000 ug | PREFILLED_SYRINGE | Freq: Once | INTRAMUSCULAR | Status: AC
  Administered 2020-12-29: 50 ug via INTRAVENOUS

## 2020-12-29 MED ORDER — VANCOMYCIN HCL 1000 MG/200ML IV SOLN: 1000.0000 mg | Freq: Once | INTRAVENOUS | Status: DC

## 2020-12-29 MED ORDER — GADOBUTROL 1 MMOL/ML IV SOLN
10.0000 mL | Freq: Once | INTRAVENOUS | Status: AC | PRN
  Administered 2020-12-29: 10 mL via INTRAVENOUS

## 2020-12-29 MED ORDER — ALBUTEROL SULFATE (2.5 MG/3ML) 0.083% IN NEBU
5.0000 mg | INHALATION_SOLUTION | Freq: Once | RESPIRATORY_TRACT | Status: AC
  Administered 2020-12-29: 5 mg via RESPIRATORY_TRACT
  Filled 2020-12-29: qty 6

## 2020-12-29 MED ORDER — LACTATED RINGERS IV SOLN: INTRAVENOUS | Status: DC

## 2020-12-29 MED ORDER — FENTANYL CITRATE PF 50 MCG/ML IJ SOSY
PREFILLED_SYRINGE | INTRAMUSCULAR | Status: AC
  Filled 2020-12-29: qty 1

## 2020-12-29 MED ORDER — VANCOMYCIN HCL 1500 MG/300ML IV SOLN: 1500.0000 mg | INTRAVENOUS | Status: DC

## 2020-12-29 MED ORDER — PREDNISONE 10 MG PO TABS: ORAL_TABLET | ORAL | 0 refills | Status: DC

## 2020-12-29 MED ORDER — SODIUM CHLORIDE 0.9 % IV SOLN: 2.0000 g | Freq: Two times a day (BID) | INTRAVENOUS | Status: DC

## 2020-12-29 MED ORDER — METRONIDAZOLE 500 MG/100ML IV SOLN
500.0000 mg | Freq: Once | INTRAVENOUS | Status: AC
  Administered 2020-12-29: 500 mg via INTRAVENOUS
  Filled 2020-12-29: qty 100

## 2020-12-29 MED ORDER — VANCOMYCIN HCL 2000 MG/400ML IV SOLN
2000.0000 mg | Freq: Once | INTRAVENOUS | Status: AC
  Administered 2020-12-29: 2000 mg via INTRAVENOUS
  Filled 2020-12-29: qty 400

## 2020-12-29 MED ORDER — OXYCODONE-ACETAMINOPHEN 5-325 MG PO TABS
1.0000 | ORAL_TABLET | Freq: Once | ORAL | Status: AC
  Administered 2020-12-29: 1 via ORAL
  Filled 2020-12-29: qty 1

## 2020-12-29 NOTE — Sepsis Progress Note (Signed)
eLink is following this Code Sepsis. °

## 2020-12-29 NOTE — ED Provider Notes (Signed)
[facility] EMERGENCY DEPARTMENT Provider Note   CSN: [id number] Arrival date & time: 12/29/20  1456     History Chief Complaint  Patient presents with   Neck Pain   Hypotension    [name] is a 65 y.o. male.  HPI He presents for evaluation of weakness, confusion, dropping things and decreased ability to ambulate.  He is also having pain in his left arm.  He was recently seen and diagnosed with cervical radiculopathy with plans for outpatient MRI imaging.  This has not been performed.  Regarding to his wife he has not had this constellation of problems previously.  There is been no documented fever and no chills.  He is eating less than usual for unclear reasons.  He is taking his usual medicines.  He does not take vasoactive medication.  There are no other known active modifying factors.    Past Medical History:  Diagnosis Date   Anemia    IRON 2005   Anxiety    Arthritis    LOWER LUMBAR STENOSIS , ARTHRITIS HANDS AND SHOULDERS   Asthma    Cancer ([place]) 2005   COLON SURGERY AND CHEMO   Complication of anesthesia    WOKE UP DURING PORT PLACEMENT AND ENDOSCOPY, SLOW TO AWAKEN FROM COLON RESECTION 2010   COPD (chronic obstructive pulmonary disease) (HCC)    Dyspnea    WITH EXERTION   GERD (gastroesophageal reflux disease)    Hydrocele, right    Neuropathy    FINGERS   Pneumonia 2016 LAST    X 3 EPISODES IN PAST   Vitamin D deficiency     Patient Active Problem List   Diagnosis Date Noted   Dyspnea 03/20/2017   Ventral hernia 03/20/2017     Past Surgical History:  Procedure Laterality Date   COLON RESECTION  2010   HERNIA REPAIR     HYDROCELE EXCISION Right 12/16/2016   Procedure: HYDROCELECTOMY ADULT RGHT;  Surgeon: Lucas Mallow, MD;  Location: [facility];  Service: Urology;  Laterality: Right;   PORT PLACEMENT     LEFT CHEST   UPPER GI ENDOSCOPY         No family history on file.  Social History   Tobacco Use   Smoking  status: Every Day    Packs/day: 1.00    Years: 45.00    Pack years: 45.00    Types: Cigarettes   Smokeless tobacco: Never  Vaping Use   Vaping Use: Never used  Substance Use Topics   Alcohol use: No   Drug use: No    Home Medications Prior to Admission medications   Medication Sig Start Date End Date Taking? Authorizing Provider  albuterol (PROVENTIL HFA;VENTOLIN HFA) 108 (90 Base) MCG/ACT inhaler Inhale 1-2 puffs into the lungs every 6 (six) hours as needed for wheezing or shortness of breath.   Yes [provider]  aspirin EC 325 MG tablet Take 1,300 mg by mouth 2 (two) times daily as needed for mild pain or moderate pain.   Yes [provider]  atorvastatin (LIPITOR) 20 MG tablet Take 20 mg by mouth daily.   Yes [provider]  bumetanide (BUMEX) 1 MG tablet Take 1 mg by mouth daily. 12/13/20  Yes [provider]  calcium carbonate (TUMS - DOSED IN MG ELEMENTAL CALCIUM) 500 MG chewable tablet Chew 1 tablet as needed by mouth for indigestion or heartburn.   Yes [provider]  chlorthalidone (HYGROTON) 25 MG tablet  Take 25 mg by mouth daily. 12/13/20  Yes [provider]  clindamycin (CLEOCIN) 300 MG capsule Take 300 mg by mouth 4 (four) times daily. 12/24/20  Yes [provider]  gabapentin (NEURONTIN) 300 MG capsule Take 300 mg by mouth 3 (three) times daily. 12/18/20  Yes [provider]  ibuprofen (ADVIL) 800 MG tablet Take 800 mg by mouth every 8 (eight) hours as needed.   Yes [provider]  ipratropium-albuterol (DUONEB) 0.5-2.5 (3) MG/3ML SOLN Take 3 mLs by nebulization every 6 (six) hours as needed (for shortness of breath).   Yes [provider]  lisinopril (ZESTRIL) 10 MG tablet Take 10 mg by mouth daily. 12/13/20  Yes [provider]  tamsulosin (FLOMAX) 0.4 MG CAPS capsule Take 0.4 mg by mouth daily. 12/13/20  Yes [provider]  HYDROcodone-acetaminophen  (NORCO/VICODIN) 5-325 MG tablet Take 1 tablet by mouth every 4 (four) hours as needed. Patient not taking: Reported on 12/29/2020 12/15/20   Isla Pence, MD  nystatin (MYCOSTATIN/NYSTOP) powder Apply topically 4 (four) times daily. Until three days after rash improves Patient not taking: Reported on 12/29/2020 09/09/16   Mesner, [name], MD  predniSONE (STERAPRED UNI-PAK 21 TAB) 10 MG (21) TBPK tablet Take by mouth daily. Take 6 tabs by mouth daily  for 2 days, then 5 tabs for 2 days, then 4 tabs for 2 days, then 3 tabs for 2 days, 2 tabs for 2 days, then 1 tab by mouth daily for 2 days Patient not taking: Reported on 12/29/2020 12/15/20   Isla Pence, MD    Allergies    Patient has no known allergies.  Review of Systems   Review of Systems  All other systems reviewed and are negative.  Physical Exam Updated Vital Signs BP (!) 92/54   Pulse 87   Temp 97.8 F (36.6 C) (Oral)   Resp 16   Wt 120.2 kg   SpO2 93%   BMI 39.13 kg/m   Physical Exam Vitals and nursing note reviewed.  Constitutional:      General: He is not in acute distress.    Appearance: He is well-developed. He is obese. He is not ill-appearing, toxic-appearing or diaphoretic.  HENT:     Head: Normocephalic and atraumatic.     Right Ear: External ear normal.     Left Ear: External ear normal.     Nose: No congestion or rhinorrhea.     Mouth/Throat:     Pharynx: No oropharyngeal exudate or posterior oropharyngeal erythema.  Eyes:     Conjunctiva/sclera: Conjunctivae normal.     Pupils: Pupils are equal, round, and reactive to light.  Neck:     Trachea: Phonation normal.  Cardiovascular:     Rate and Rhythm: Normal rate and regular rhythm.     Heart sounds: Normal heart sounds.  Pulmonary:     Effort: Pulmonary effort is normal.     Breath sounds: Normal breath sounds.  Abdominal:     General: There is no distension.     Palpations: Abdomen is soft.     Tenderness: There is no abdominal tenderness.   Musculoskeletal:        General: Normal range of motion.     Cervical back: Normal range of motion and neck supple.  Skin:    General: Skin is warm and dry.  Neurological:     Mental Status: He is alert and oriented to person, place, and time.     Cranial Nerves: No cranial nerve  deficit.     Sensory: No sensory deficit.     Motor: No abnormal muscle tone.     Coordination: Coordination normal.     Comments: No dysarthria or aphasia.  No nystagmus.  No ataxia.  Psychiatric:        Mood and Affect: Mood normal.        Behavior: Behavior normal.        Thought Content: Thought content normal.        Judgment: Judgment normal.    ED Results / Procedures / Treatments   Labs (all labs ordered are listed, but only abnormal results are displayed) Labs Reviewed  COMPREHENSIVE METABOLIC PANEL - Abnormal; Notable for the following components:      Result Value   Sodium 133 (*)    Chloride 93 (*)    BUN 32 (*)    Creatinine, Ser 2.01 (*)    Calcium 8.5 (*)    Total Protein 5.6 (*)    Albumin 3.1 (*)    AST 12 (*)    Total Bilirubin <0.1 (*)    GFR, Estimated 36 (*)    All other components within normal limits  CBC WITH DIFFERENTIAL/PLATELET - Abnormal; Notable for the following components:   WBC 14.7 (*)    RBC 4.01 (*)    Neutro Abs 12.3 (*)    All other components within normal limits  URINALYSIS, ROUTINE W REFLEX MICROSCOPIC - Abnormal; Notable for the following components:   APPearance HAZY (*)    Specific Gravity, Urine <1.005 (*)    All other components within normal limits  RESP PANEL BY RT-PCR (FLU A&B, COVID) ARPGX2  CULTURE, BLOOD (ROUTINE X 2)  CULTURE, BLOOD (ROUTINE X 2)  URINE CULTURE  LACTIC ACID, PLASMA  LACTIC ACID, PLASMA  PROTIME-INR  APTT    EKG EKG Interpretation  Date/Time:  Friday December 29 2020 15:34:58 EST Ventricular Rate:  84 PR Interval:  161 QRS Duration: 88 QT Interval:  363 QTC Calculation: 430 R Axis:   53 Text  Interpretation: Sinus rhythm RSR' in V1 or V2, right VCD or RVH Minimal ST elevation, inferior leads since last tracing no significant change Confirmed by Daleen Bo [phone]) on 12/29/2020 4:06:46 PM  Radiology MR Cervical Spine W or Wo Contrast  Result Date: 12/29/2020 CLINICAL DATA:  Cervical radiculopathy.  Spinal stenosis. EXAM: MRI CERVICAL SPINE WITHOUT AND WITH CONTRAST TECHNIQUE: Multiplanar and multiecho pulse sequences of the cervical spine, to include the craniocervical junction and cervicothoracic junction, were obtained without and with intravenous contrast. CONTRAST:  81mL GADAVIST GADOBUTROL 1 MMOL/ML IV SOLN COMPARISON:  CT cervical spine 12/15/2020 FINDINGS: Alignment: Mild anterolisthesis C4-5 Vertebrae: No vertebral body fracture or mass. Cord: Cord evaluation is not well evaluated due to significant motion on the study. There is a calcified lesion in the spinal canal on the left at C4-5. This measures approximately 5 x 7 x 10 mm and is noted on the recent CT. This is located medial to the degenerated facet. This is very low signal on T1 and T2 and does not enhance. This is contributing to left foraminal encroachment. Posterior Fossa, vertebral arteries, paraspinal tissues: Negative Disc levels: C2-3: Right facet degeneration.  Negative for stenosis C3-4: Mild foraminal narrowing bilaterally due to uncinate spurring and facet hypertrophy C4-5: Disc degeneration and facet degeneration. Left foraminal encroachment due to spurring. Calcified mass in the left lateral spinal canal as described above contributes to left foraminal encroachment. C5-6: Disc degeneration and spondylosis. Mild spinal stenosis  and moderate foraminal stenosis bilaterally. C6-7: Disc degeneration with diffuse uncinate spurring. Mild spinal stenosis and moderate foraminal stenosis bilaterally C7-T1: Bilateral facet degeneration. Mild foraminal narrowing bilaterally. IMPRESSION: 1. Image quality degraded by extensive  motion 2. Multilevel spondylosis in the cervical spine 3. Calcified lesion in the left lateral spinal canal at C4-5 as noted on CT. This does not enhance. Most meningiomas will show enhancement. This could be a degenerative calcification possibly related to calcified synovial cyst from the left-sided facet joint which is significantly degenerated. Electronically Signed   By: Franchot Gallo M.D.   On: 12/29/2020 19:19   DG Chest Port 1 View  Result Date: 12/29/2020 CLINICAL DATA:  Questionable sepsis, shortness of breath, weakness EXAM: PORTABLE CHEST 1 VIEW COMPARISON:  Chest x-ray 06/27/2016, CT chest 09/10/2014 FINDINGS: Left chest wall Port-A-Cath with tip overlying the expected region of the left subclavian artery in the region of its confluence with the superior vena cava. The heart and mediastinal contours are unchanged. Aortic calcification. Persistent chronic 1.3 cm right upper lobe nodule that is better evaluated on CT chest 09/09/2016. Bibasilar atelectasis. No focal consolidation. Slightly increased interstitial markings with no overt pulmonary edema. No pleural effusion. No pneumothorax. No acute osseous abnormality. IMPRESSION: 1. Slightly increased interstitial markings with no overt pulmonary edema. 2. Persistent chronic 1.3 cm right upper lobe nodule that is better evaluated on CT chest 09/09/2016. Electronically Signed   By: Iven Finn M.D.   On: 12/29/2020 16:27   DG Chest Port 1V same Day  Result Date: 12/29/2020 CLINICAL DATA:  Shortness of breath EXAM: PORTABLE CHEST 1 VIEW COMPARISON:  Previous studies including the examination done earlier today FINDINGS: Transverse diameter of heart is in the upper limits of normal. Apparent shift of mediastinum to the right may be due to rotation. Increased interstitial markings are seen in both lower lung fields suggesting scarring and possibly superimposed interstitial pneumonia. There is no focal consolidation. There is no significant pleural  effusion or pneumothorax. There is an ill-defined 1.3 cm radiopacity in the right upper lung field overlying the anterior right second rib which has not changed. This may suggest scarring from previous inflammation or parenchymal nodule such as granuloma or neoplasm. A similar sized nodule was seen in the right upper lung fields in the CT chest done on 09/09/2016. IMPRESSION: Increased interstitial markings are seen in the lower lung fields, more so on the left side suggesting interstitial pneumonia and possibly underlying scarring. There is no focal pulmonary consolidation. There is no pleural effusion or pneumothorax. There is an ill-defined 1.3 cm nodular density in the right upper lung fields with no significant change Electronically Signed   By: Elmer Picker M.D.   On: 12/29/2020 18:04    Procedures .Critical Care Performed by: Daleen Bo, MD Authorized by: Daleen Bo, MD   Critical care provider statement:    Critical care time (minutes):  50   Critical care start time:  12/29/2020 3:45 PM   Critical care end time:  12/29/2020 9:00 PM   Critical care time was exclusive of:  Separately billable procedures and treating other patients   Critical care was necessary to treat or prevent imminent or life-threatening deterioration of the following conditions:  Sepsis   Critical care was time spent personally by me on the following activities:  Blood draw for specimens, development of treatment plan with patient or surrogate, discussions with consultants, evaluation of patient's response to treatment, examination of patient, ordering and performing treatments and interventions, ordering  and review of laboratory studies, ordering and review of radiographic studies, pulse oximetry, re-evaluation of patient's condition and review of old charts   Medications Ordered in ED Medications  lactated ringers infusion ( Intravenous New Bag/Given 12/29/20 1556)  ceFEPIme (MAXIPIME) 2 g in sodium  chloride 0.9 % 100 mL IVPB (has no administration in time range)  vancomycin (VANCOREADY) IVPB 1500 mg/300 mL (has no administration in time range)  lactated ringers bolus 1,000 mL (0 mLs Intravenous Stopped 12/29/20 1730)    And  lactated ringers bolus 1,000 mL (0 mLs Intravenous Stopped 12/29/20 1730)    And  lactated ringers bolus 1,000 mL (0 mLs Intravenous Stopped 12/29/20 1830)    And  lactated ringers bolus 1,000 mL (0 mLs Intravenous Stopped 12/29/20 1831)  ceFEPIme (MAXIPIME) 2 g in sodium chloride 0.9 % 100 mL IVPB (0 g Intravenous Stopped 12/29/20 1712)  metroNIDAZOLE (FLAGYL) IVPB 500 mg (0 mg Intravenous Stopped 12/29/20 1711)  vancomycin (VANCOREADY) IVPB 2000 mg/400 mL (0 mg Intravenous Stopped 12/29/20 2041)  fentaNYL (SUBLIMAZE) injection 50 mcg (50 mcg Intravenous Given 12/29/20 1724)  albuterol (PROVENTIL) (2.5 MG/3ML) 0.083% nebulizer solution 5 mg (5 mg Nebulization Given 12/29/20 1802)  gadobutrol (GADAVIST) 1 MMOL/ML injection 10 mL (10 mLs Intravenous Contrast Given 12/29/20 1857)    ED Course  I have reviewed the triage vital signs and the nursing notes.  Pertinent labs & imaging results that were available during my care of the patient were reviewed by me and considered in my medical decision making (see chart for details).  Clinical Course as of 12/29/20 2116  Fri Dec 29, 2020  1546 Patient with marked hypotension, without obvious signs for acute infection.  Patient will be treated as sepsis until that can be ruled out.  The cervical spine abnormality on prior CT imaging, necessitates urgent MRI imaging, to rule out infective process.  He could potentially have hypotension from severe cervical compromise. [EW]  4098 Blood pressure improving. [EW]  1725 Nurse told me he was worried about shortness of breath and the patient.  Currently patient is saturating 93% on nasal cannula oxygen which she has in his mouth.  At this time lungs had decreased air movement with scattered  end expiratory wheezes.  He states he has not had his albuterol yet today.  I will give him an albuterol nebulizer. [EW]  1191 MR Cervical Spine W or Wo Contrast [EW]  4782 MR Cervical Spine W or Wo Contrast [EW]  2058 At this time the standing blood pressure is 114/59.  He stood for 4 minutes and did not get dizzy.  At this time lungs have fair air movement, with few end expiratory wheezes.  He is not in respiratory distress.  Oxygenation is 93% on room air.  He has an albuterol nebulizer to use at home. [EW]    Clinical Course User Index [EW] Daleen Bo, MD   MDM Rules/Calculators/A&P                            Patient Vitals for the past 24 hrs:  BP Temp Temp src Pulse Resp SpO2 Weight  12/29/20 2058 -- -- -- 87 16 93 % --  12/29/20 2055 -- -- -- 92 14 94 % --  12/29/20 2050 (!) 92/54 -- -- 75 (!) 26 (!) 67 % --  12/29/20 2033 (!) 110/58 -- -- 90 15 94 % --  12/29/20 2000 (!) 105/58 -- -- 89 16  90 % --  12/29/20 1954 -- -- -- 85 20 91 % --  12/29/20 1950 93/62 -- -- 86 20 (!) 89 % --  12/29/20 1945 -- -- -- 90 14 90 % --  12/29/20 1933 -- -- -- 89 17 96 % --  12/29/20 1930 (!) 108/53 -- -- 89 20 91 % --  12/29/20 1925 (!) 92/53 -- -- (!) 39 (!) 22 94 % --  12/29/20 1905 130/80 -- -- 94 (!) 33 93 % --  12/29/20 1815 (!) 111/56 -- -- 80 19 98 % --  12/29/20 1803 -- -- -- -- -- (!) 89 % --  12/29/20 1730 (!) 122/57 -- -- (!) 144 12 (!) 88 % --  12/29/20 1715 (!) 88/41 -- -- 88 14 92 % --  12/29/20 1700 (!) 104/49 -- -- 87 (!) 25 (!) 88 % --  12/29/20 1645 -- -- -- 81 16 91 % --  12/29/20 1640 (!) 94/51 -- -- (!) 54 14 95 % --  12/29/20 1636 -- -- -- -- -- -- 120.2 kg  12/29/20 1630 (!) 84/74 -- -- 82 14 95 % --  12/29/20 1620 (!) 95/48 -- -- 87 (!) 25 92 % --  12/29/20 1615 -- -- -- 78 (!) 23 94 % --  12/29/20 1610 (!) 71/41 -- -- 80 18 94 % --  12/29/20 1600 (!) 70/42 -- -- 78 19 93 % --  12/29/20 1550 (!) 76/42 -- -- 81 17 94 % --  12/29/20 1545 (!) 80/53 -- -- 86 (!)  22 90 % --  12/29/20 1530 (!) 72/44 -- -- (!) 43 -- (!) 83 % --  12/29/20 1518 (!) 65/52 97.8 F (36.6 C) Oral 92 19 92 % --    8:55 PM Reevaluation with update and discussion. After initial assessment and treatment, an updated evaluation reveals he is alert and comfortable.  He is not dizzy when standing.  He desires to go home.  He and his wife understand the risk of decompensation if he leaves now.  He would like to take some more prednisone and get a narcotic for help with his pain control.  He has a follow-up appointment with neurosurgery, in 3 days.  He plans to have his neck evaluated then.  Findings discussed with patient and his wife at the bedside, all questions answered. Daleen Bo   Medical Decision Making:  This patient is presenting for evaluation of weakness and confusion, which does require a range of treatment options, and is a complaint that involves a high risk of morbidity and mortality. The differential diagnoses include sepsis, acute spinal syndrome, vascular compromise from hypo-volumemia. I decided to review old records, and in summary is an elderly male who lives at home, and is presenting for ongoing pain and weakness.  He has a history of anemia, asthma, COPD, GERD I obtained additional historical information from wife at bedside.  Clinical Laboratory Tests Ordered, included CBC, Metabolic panel, Urinalysis, and lactate, blood cultures . Review indicates no muscle except white count high, sodium low, chloride low, BUN high, creatinine high, calcium low, total protein low, albumin low, AST low, total bilirubin low, GFR low. Radiologic Tests Ordered, included chest x-ray.  I independently Visualized: Radiograph images, which show no infiltrate or edema  Cardiac Monitor Tracing which shows normal sinus rhythm   Critical Interventions-clinical evaluation, laboratory testing, empiric antibiotics and IV fluids, radiography, observation and reassessment  After These  Interventions, the Patient was reevaluated and was found  with persistent low blood pressure despite high-volume lactated ringer bolus.  He received empiric antibiotics but does not have any evidence for localized infection.  White count is elevated, no fever.  Lactate is normal He has decreased intake recently likely because of malaise due to neck pain and left arm pain.  Imaging with MRI does not indicate acute spinal cord compromise.  There is no evidence for cervical spinal infection.  He does not have other localizing infectious symptoms.  Patient prefers going home.  He understands that he will have to return if he cannot manage himself there.  CRITICAL CARE-yes Performed by: Daleen Bo  Nursing Notes Reviewed/ Care Coordinated Applicable Imaging Reviewed Interpretation of Laboratory Data incorporated into ED treatment  The patient appears reasonably screened and/or stabilized for discharge and I doubt any other medical condition or other [facility] requiring further screening, evaluation, or treatment in the ED at this time prior to discharge.  Plan: Home Medications-continue current; Home Treatments-increase oral fluid intake; return here if the recommended treatment, does not improve the symptoms; Recommended follow up-PCP checkup next week.  Neurology follow-up for ongoing management of neck arthritis with suspected nerve impingement     Final Clinical Impression(s) / ED Diagnoses Final diagnoses:  AKI (acute kidney injury) ([place])  Neck pain  Radiculopathy, unspecified spinal region  Anorexia  Malaise    Rx / DC Orders ED Discharge Orders     None        Daleen Bo, MD 12/30/20 1512

## 2020-12-29 NOTE — ED Triage Notes (Signed)
Seen here on 12-15-20. Diagnosed with spinal stenosis, increased numbness and difficulty walking, low blood pressure

## 2020-12-29 NOTE — Progress Notes (Signed)
Pharmacy Antibiotic Note  [name] a 65 y.o. male admitted on 12/29/2020 with  unknown source .  Pharmacy has been consulted for vancomycin and cefepime dosing.  Plan: Vancomycin 1500mg  IV every 24 hours.  Goal trough 15-20 mcg/mL. Cefepime 2gm IV every 12 hours.  Medical History: Past Medical History:  Diagnosis Date   Anemia    IRON 2005   Anxiety    Arthritis    LOWER LUMBAR STENOSIS , ARTHRITIS HANDS AND SHOULDERS   Asthma    Cancer ([place]) 2005   COLON SURGERY AND CHEMO   Complication of anesthesia    WOKE UP DURING PORT PLACEMENT AND ENDOSCOPY, SLOW TO AWAKEN FROM COLON RESECTION 2010   COPD (chronic obstructive pulmonary disease) (HCC)    Dyspnea    WITH EXERTION   GERD (gastroesophageal reflux disease)    Hydrocele, right    Neuropathy    FINGERS   Pneumonia 2016 LAST    X 3 EPISODES IN PAST   Vitamin D deficiency     Allergies:  No Known Allergies  Filed Weights   12/29/20 1636  Weight: 120.2 kg (265 lb)    CBC Latest Ref Rng & Units 12/29/2020 12/16/2016 09/09/2016  WBC 4.0 - 10.5 K/uL 14.7(H) - 7.0  Hemoglobin 13.0 - 17.0 g/dL 13.1 14.7 16.0  Hematocrit 39.0 - 52.0 % 40.1 - 47.4  Platelets 150 - 400 K/uL 206 - 218     Estimated Creatinine Clearance: 46.3 mL/min (A) (by C-G formula based on SCr of 2.01 mg/dL (H)).  Antibiotics Given (last 72 hours)     Date/Time Action Medication Dose Rate   12/29/20 1615 New Bag/Given   metroNIDAZOLE (FLAGYL) IVPB 500 mg 500 mg 100 mL/hr   12/29/20 1635 New Bag/Given   ceFEPIme (MAXIPIME) 2 g in sodium chloride 0.9 % 100 mL IVPB 2 g 200 mL/hr       Antimicrobials this admission:  Cefepime 12/29/2020  >>  vancomycin 12/29/2020  >>  Metronidazole 12/29/2020 x 1   Microbiology results: 12/29/2020  BCx: sent 12/29/2020  UCx: sent 12/29/2020  Resp Panel: sent  12/29/2020  MRSA PCR: sent  Thank you for allowing pharmacy to be a part of this patient's care.  Thomasenia Sales, PharmD Clinical  Pharmacist

## 2020-12-29 NOTE — Discharge Instructions (Signed)
The testing today indicates that you are dehydrated.  Try to drink 2 L of water or water equivalent every day.  Use heat on the sore area of your neck and shoulder.  Follow-up with the neurosurgeon on Monday as scheduled.  We sent prescriptions to your pharmacy.  Do not drive when taking the narcotic pain reliever.

## 2020-12-31 LAB — URINE CULTURE: Culture: NO GROWTH

## 2021-01-03 ENCOUNTER — Other Ambulatory Visit (HOSPITAL_COMMUNITY): Payer: Self-pay | Admitting: Neurological Surgery

## 2021-01-03 ENCOUNTER — Other Ambulatory Visit: Payer: Self-pay | Admitting: Neurological Surgery

## 2021-01-03 DIAGNOSIS — M25512 Pain in left shoulder: Secondary | ICD-10-CM

## 2021-01-03 DIAGNOSIS — G8929 Other chronic pain: Secondary | ICD-10-CM

## 2021-01-03 DIAGNOSIS — G9519 Other vascular myelopathies: Secondary | ICD-10-CM

## 2021-01-03 LAB — CULTURE, BLOOD (ROUTINE X 2)
Culture: NO GROWTH
Culture: NO GROWTH
Special Requests: ADEQUATE

## 2021-01-12 ENCOUNTER — Emergency Department (HOSPITAL_COMMUNITY): Payer: Medicare Other

## 2021-01-12 ENCOUNTER — Inpatient Hospital Stay (HOSPITAL_COMMUNITY)
Admission: EM | Admit: 2021-01-12 | Discharge: 2021-01-15 | DRG: 190 | Disposition: A | Discharge status: Home / Self Care | Payer: Medicare Other | Attending: Family Medicine | Admitting: Family Medicine

## 2021-01-12 ENCOUNTER — Encounter (HOSPITAL_COMMUNITY): Payer: Self-pay | Admitting: *Deleted

## 2021-01-12 DIAGNOSIS — G8929 Other chronic pain: Secondary | ICD-10-CM | POA: Diagnosis present

## 2021-01-12 DIAGNOSIS — K219 Gastro-esophageal reflux disease without esophagitis: Secondary | ICD-10-CM | POA: Diagnosis present

## 2021-01-12 DIAGNOSIS — J449 Chronic obstructive pulmonary disease, unspecified: Secondary | ICD-10-CM | POA: Diagnosis present

## 2021-01-12 DIAGNOSIS — I700 Atherosclerosis of aorta: Secondary | ICD-10-CM | POA: Diagnosis present

## 2021-01-12 DIAGNOSIS — F1021 Alcohol dependence, in remission: Secondary | ICD-10-CM | POA: Diagnosis present

## 2021-01-12 DIAGNOSIS — Z79899 Other long term (current) drug therapy: Secondary | ICD-10-CM

## 2021-01-12 DIAGNOSIS — J441 Chronic obstructive pulmonary disease with (acute) exacerbation: Principal | ICD-10-CM | POA: Diagnosis present

## 2021-01-12 DIAGNOSIS — R0602 Shortness of breath: Secondary | ICD-10-CM | POA: Diagnosis not present

## 2021-01-12 DIAGNOSIS — G894 Chronic pain syndrome: Secondary | ICD-10-CM | POA: Diagnosis present

## 2021-01-12 DIAGNOSIS — Z7982 Long term (current) use of aspirin: Secondary | ICD-10-CM

## 2021-01-12 DIAGNOSIS — Z8249 Family history of ischemic heart disease and other diseases of the circulatory system: Secondary | ICD-10-CM

## 2021-01-12 DIAGNOSIS — N400 Enlarged prostate without lower urinary tract symptoms: Secondary | ICD-10-CM | POA: Diagnosis present

## 2021-01-12 DIAGNOSIS — I2510 Atherosclerotic heart disease of native coronary artery without angina pectoris: Secondary | ICD-10-CM | POA: Diagnosis present

## 2021-01-12 DIAGNOSIS — Z20822 Contact with and (suspected) exposure to covid-19: Secondary | ICD-10-CM | POA: Diagnosis present

## 2021-01-12 DIAGNOSIS — M25512 Pain in left shoulder: Secondary | ICD-10-CM | POA: Diagnosis present

## 2021-01-12 DIAGNOSIS — M25511 Pain in right shoulder: Secondary | ICD-10-CM | POA: Diagnosis present

## 2021-01-12 DIAGNOSIS — F17210 Nicotine dependence, cigarettes, uncomplicated: Secondary | ICD-10-CM | POA: Diagnosis present

## 2021-01-12 DIAGNOSIS — C801 Malignant (primary) neoplasm, unspecified: Secondary | ICD-10-CM | POA: Diagnosis present

## 2021-01-12 DIAGNOSIS — R609 Edema, unspecified: Secondary | ICD-10-CM | POA: Diagnosis present

## 2021-01-12 DIAGNOSIS — J9601 Acute respiratory failure with hypoxia: Secondary | ICD-10-CM | POA: Diagnosis present

## 2021-01-12 DIAGNOSIS — Z9221 Personal history of antineoplastic chemotherapy: Secondary | ICD-10-CM | POA: Diagnosis not present

## 2021-01-12 DIAGNOSIS — R251 Tremor, unspecified: Secondary | ICD-10-CM | POA: Diagnosis present

## 2021-01-12 DIAGNOSIS — R4182 Altered mental status, unspecified: Secondary | ICD-10-CM

## 2021-01-12 DIAGNOSIS — J9621 Acute and chronic respiratory failure with hypoxia: Secondary | ICD-10-CM | POA: Diagnosis present

## 2021-01-12 DIAGNOSIS — Z85038 Personal history of other malignant neoplasm of large intestine: Secondary | ICD-10-CM | POA: Diagnosis not present

## 2021-01-12 DIAGNOSIS — G629 Polyneuropathy, unspecified: Secondary | ICD-10-CM | POA: Diagnosis present

## 2021-01-12 LAB — URINALYSIS, ROUTINE W REFLEX MICROSCOPIC
Bilirubin Urine: NEGATIVE
Glucose, UA: NEGATIVE mg/dL
Hgb urine dipstick: NEGATIVE
Ketones, ur: NEGATIVE mg/dL
Nitrite: NEGATIVE
Protein, ur: NEGATIVE mg/dL
Specific Gravity, Urine: 1.015 (ref 1.005–1.030)
pH: 5.5 (ref 5.0–8.0)

## 2021-01-12 LAB — RAPID URINE DRUG SCREEN, HOSP PERFORMED
Amphetamines: NOT DETECTED
Barbiturates: NOT DETECTED
Benzodiazepines: NOT DETECTED
Cocaine: NOT DETECTED
Opiates: POSITIVE — AB
Tetrahydrocannabinol: NOT DETECTED

## 2021-01-12 LAB — CBC WITH DIFFERENTIAL/PLATELET
Abs Immature Granulocytes: 0.03 10*3/uL (ref 0.00–0.07)
Basophils Absolute: 0 10*3/uL (ref 0.0–0.1)
Basophils Relative: 0 %
Eosinophils Absolute: 0.1 10*3/uL (ref 0.0–0.5)
Eosinophils Relative: 2 %
HCT: 40.8 % (ref 39.0–52.0)
Hemoglobin: 13.2 g/dL (ref 13.0–17.0)
Immature Granulocytes: 0 %
Lymphocytes Relative: 13 %
Lymphs Abs: 1 10*3/uL (ref 0.7–4.0)
MCH: 32.8 pg (ref 26.0–34.0)
MCHC: 32.4 g/dL (ref 30.0–36.0)
MCV: 101.5 fL — ABNORMAL HIGH (ref 80.0–100.0)
Monocytes Absolute: 0.7 10*3/uL (ref 0.1–1.0)
Monocytes Relative: 9 %
Neutro Abs: 5.4 10*3/uL (ref 1.7–7.7)
Neutrophils Relative %: 76 %
Platelets: 188 10*3/uL (ref 150–400)
RBC: 4.02 MIL/uL — ABNORMAL LOW (ref 4.22–5.81)
RDW: 14.4 % (ref 11.5–15.5)
WBC: 7.3 10*3/uL (ref 4.0–10.5)
nRBC: 0 % (ref 0.0–0.2)

## 2021-01-12 LAB — URINALYSIS, MICROSCOPIC (REFLEX): Bacteria, UA: NONE SEEN

## 2021-01-12 LAB — COMPREHENSIVE METABOLIC PANEL
ALT: 16 U/L (ref 0–44)
AST: 18 U/L (ref 15–41)
Albumin: 3.4 g/dL — ABNORMAL LOW (ref 3.5–5.0)
Alkaline Phosphatase: 124 U/L (ref 38–126)
Anion gap: 12 (ref 5–15)
BUN: 22 mg/dL (ref 8–23)
CO2: 29 mmol/L (ref 22–32)
Calcium: 9.4 mg/dL (ref 8.9–10.3)
Chloride: 88 mmol/L — ABNORMAL LOW (ref 98–111)
Creatinine, Ser: 2.06 mg/dL — ABNORMAL HIGH (ref 0.61–1.24)
GFR, Estimated: 35 mL/min — ABNORMAL LOW (ref >60)
Glucose, Bld: 85 mg/dL (ref 70–99)
Potassium: 4.2 mmol/L (ref 3.5–5.1)
Sodium: 129 mmol/L — ABNORMAL LOW (ref 135–145)
Total Bilirubin: 0.6 mg/dL (ref 0.3–1.2)
Total Protein: 6.3 g/dL — ABNORMAL LOW (ref 6.5–8.1)

## 2021-01-12 LAB — LIPASE, BLOOD: Lipase: 18 U/L (ref 11–51)

## 2021-01-12 LAB — BLOOD GAS, VENOUS
Acid-Base Excess: 7.7 mmol/L — ABNORMAL HIGH (ref 0.0–2.0)
Bicarbonate: 29 mmol/L — ABNORMAL HIGH (ref 20.0–28.0)
Drawn by: 5678
FIO2: 32
O2 Saturation: 59.3 %
Patient temperature: 37
pCO2, Ven: 62 mmHg — ABNORMAL HIGH (ref 44.0–60.0)
pH, Ven: 7.349 (ref 7.250–7.430)
pO2, Ven: 31.1 mmHg — CL (ref 32.0–45.0)

## 2021-01-12 LAB — LACTIC ACID, PLASMA: Lactic Acid, Venous: 1.3 mmol/L (ref 0.5–1.9)

## 2021-01-12 LAB — CBG MONITORING, ED: Glucose-Capillary: 78 mg/dL (ref 70–99)

## 2021-01-12 LAB — RESP PANEL BY RT-PCR (FLU A&B, COVID) ARPGX2
Influenza A by PCR: NEGATIVE
Influenza B by PCR: NEGATIVE
SARS Coronavirus 2 by RT PCR: NEGATIVE

## 2021-01-12 LAB — BRAIN NATRIURETIC PEPTIDE: B Natriuretic Peptide: 38 pg/mL (ref 0.0–100.0)

## 2021-01-12 LAB — ETHANOL: Alcohol, Ethyl (B): <10 mg/dL (ref <10)

## 2021-01-12 LAB — TROPONIN I (HIGH SENSITIVITY): Troponin I (High Sensitivity): 9 ng/L (ref <18)

## 2021-01-12 IMAGING — CT CT HEAD W/O CM
3 series · 16 of 47 positions shown, 19 images · non-contrast
Comparison: Head CT [DATE]

CLINICAL DATA: Mental status change, unknown cause

EXAM:
CT HEAD WITHOUT CONTRAST
TECHNIQUE: Contiguous axial images were obtained from the base of the skull
through the vertex without intravenous contrast.

[Series 2: head w o · axial · 0.47mm/px · z∈[-128,+17]mm · 10 of 35 slices shown, 13 images]
[im 3/35  brain]
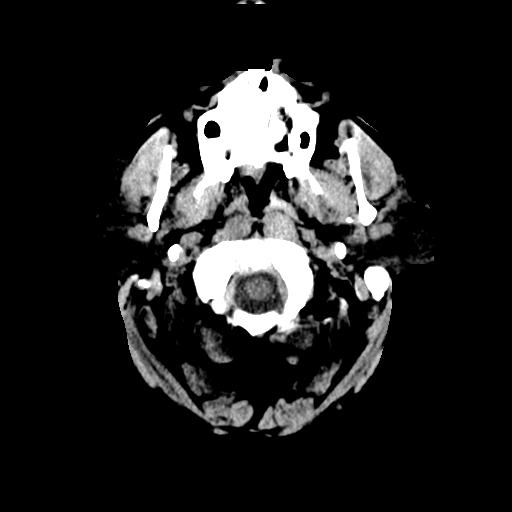
[im 3/35  bone]
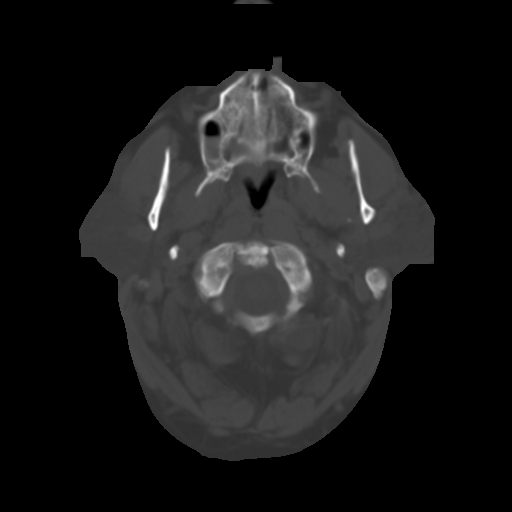
[im 6/35  brain]
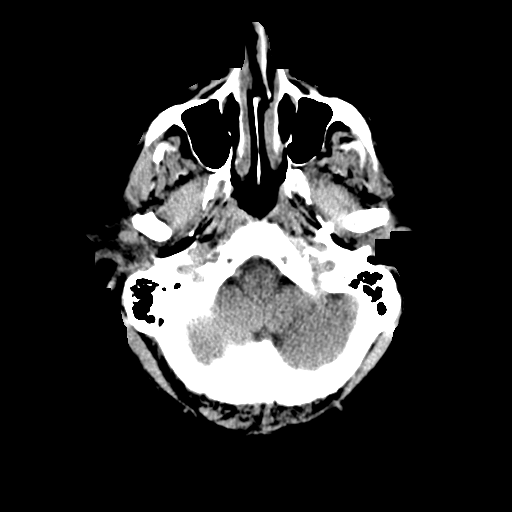
[im 10/35  brain]
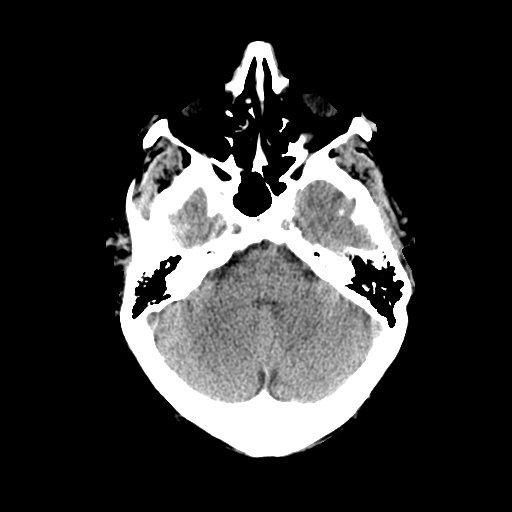
[im 12/35  brain]
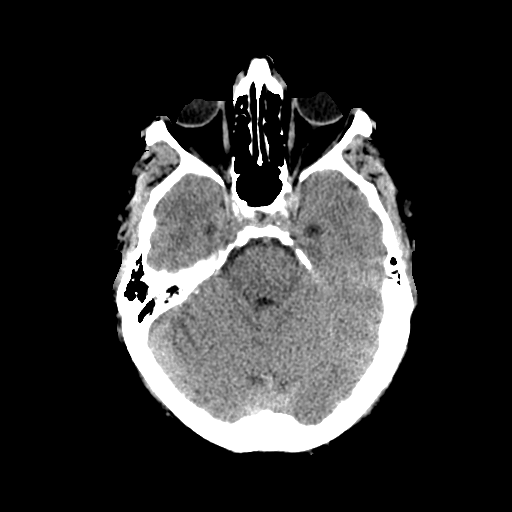
[im 16/35  brain]
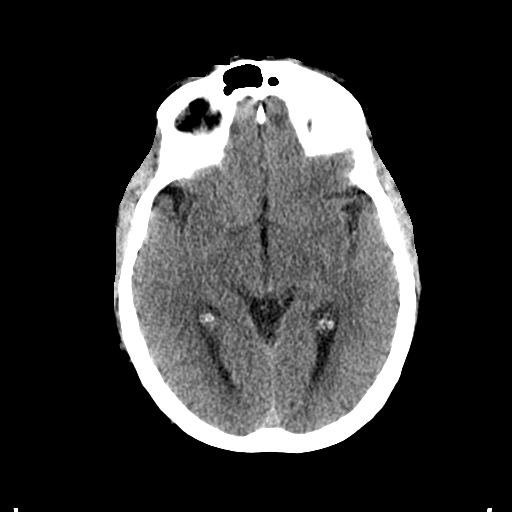
[im 16/35  bone]
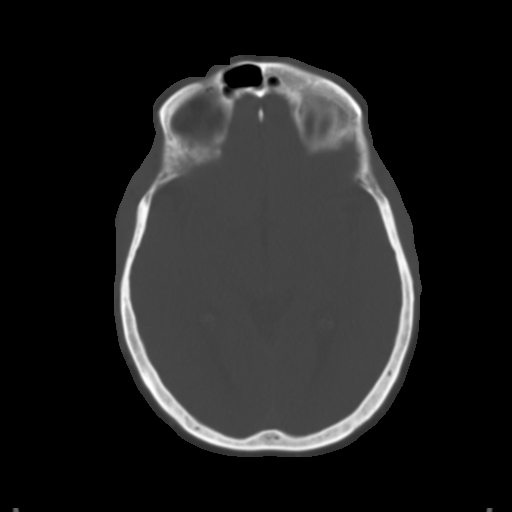
[im 19/35  brain]
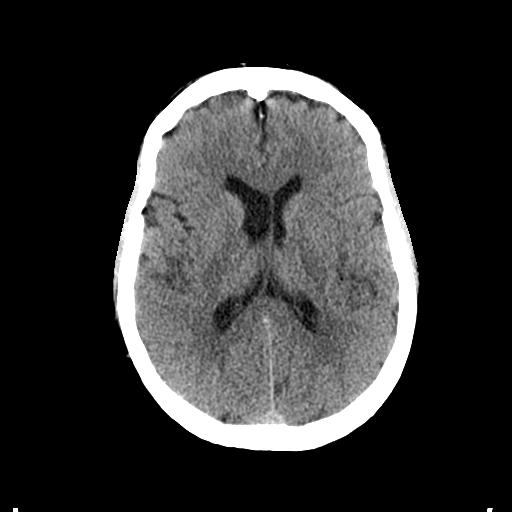
[im 23/35  brain]
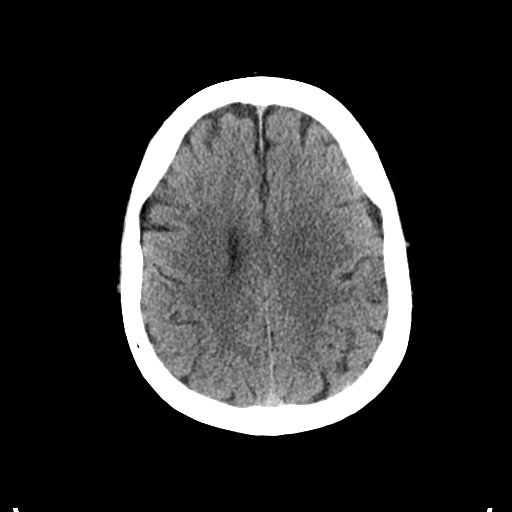
[im 26/35  brain]
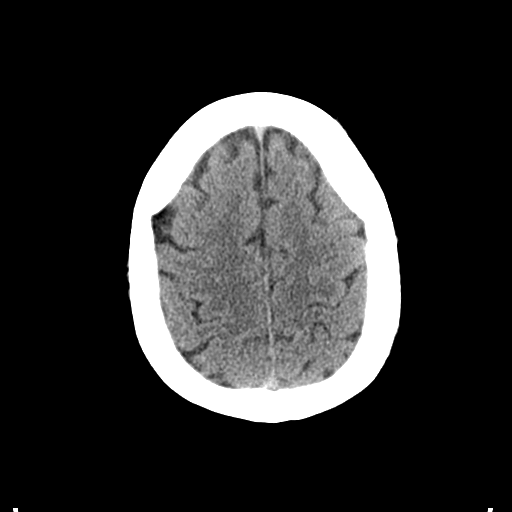
[im 29/35  brain]
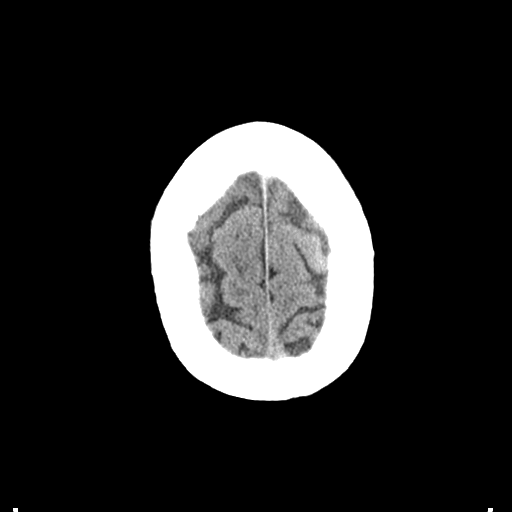
[im 29/35  bone]
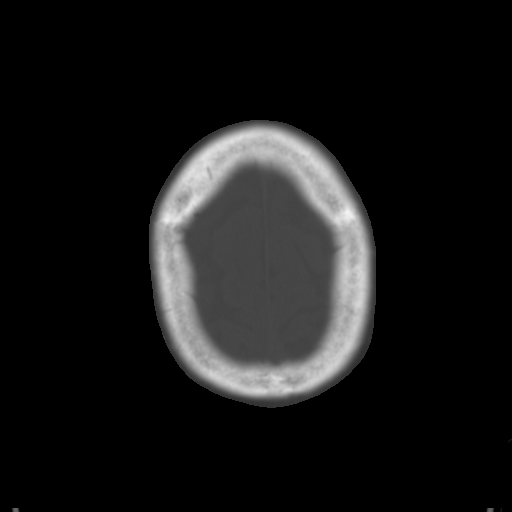
[im 32/35  brain]
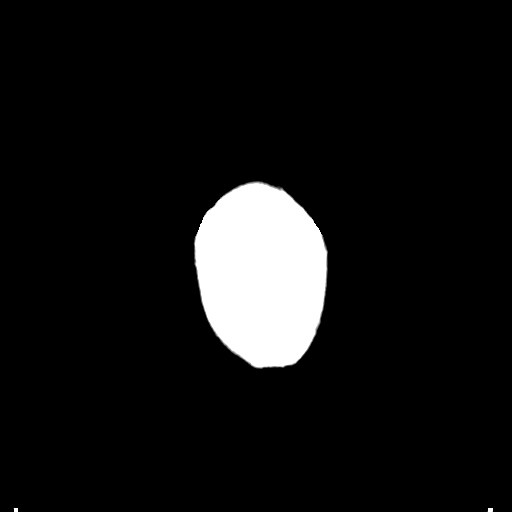

[Series 4: coronal soft · coronal · 0.35mm/px · 3 of 74 slices shown]
[im 25/74  brain]
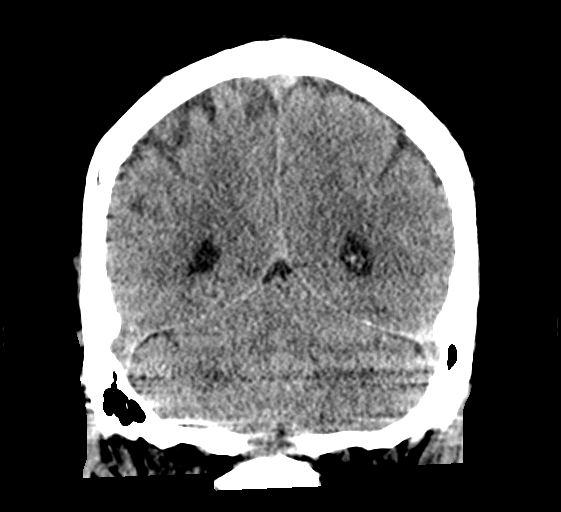
[im 33/74  brain]
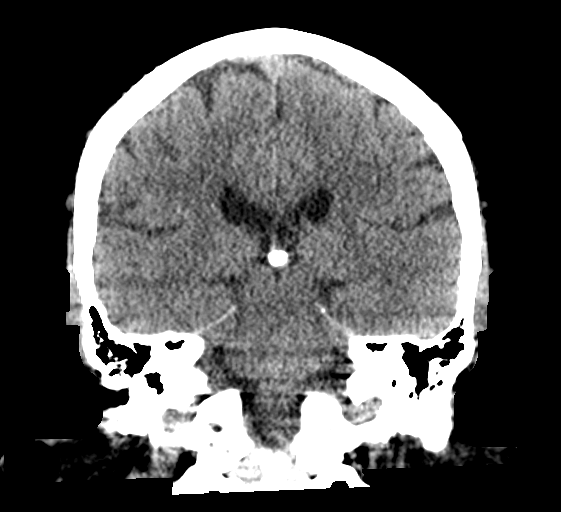
[im 41/74  brain]
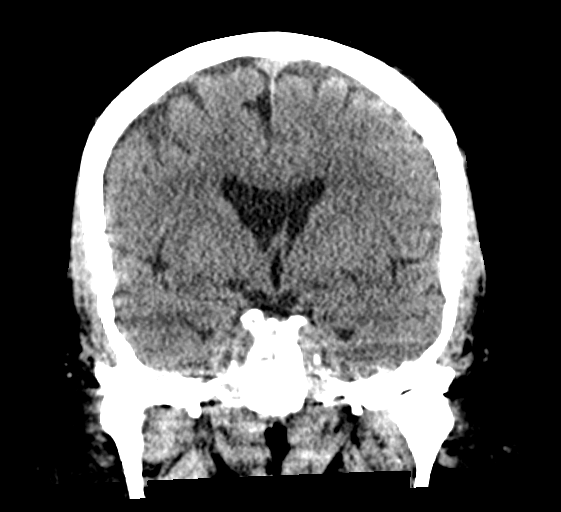

[Series 5: sagittal soft · sagittal · 0.35mm/px · 3 of 65 slices shown]
[im 22/65  brain]
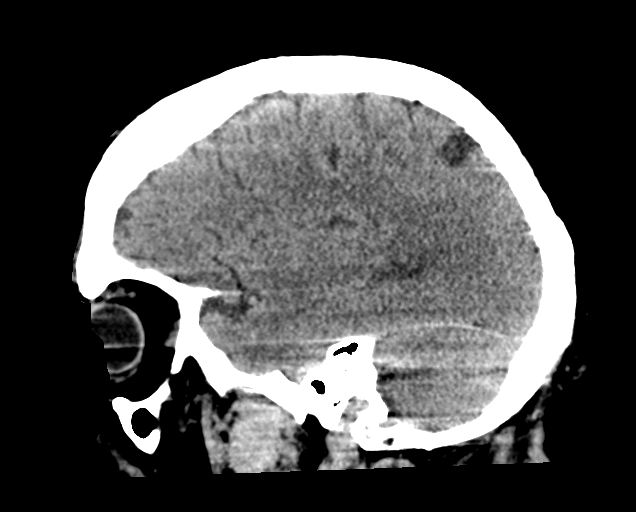
[im 33/65  brain]
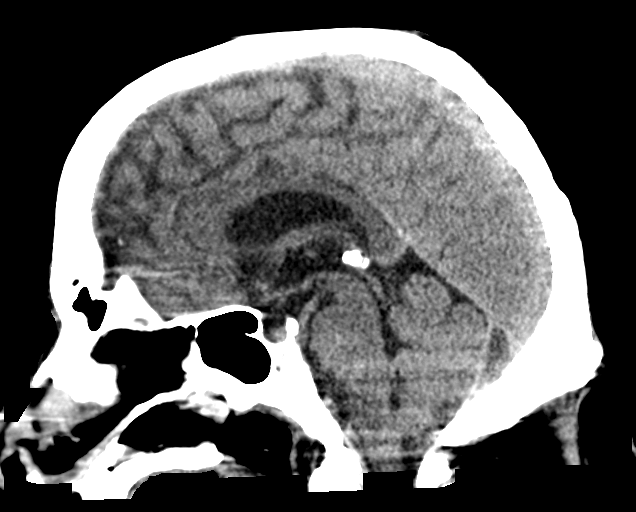
[im 43/65  brain]
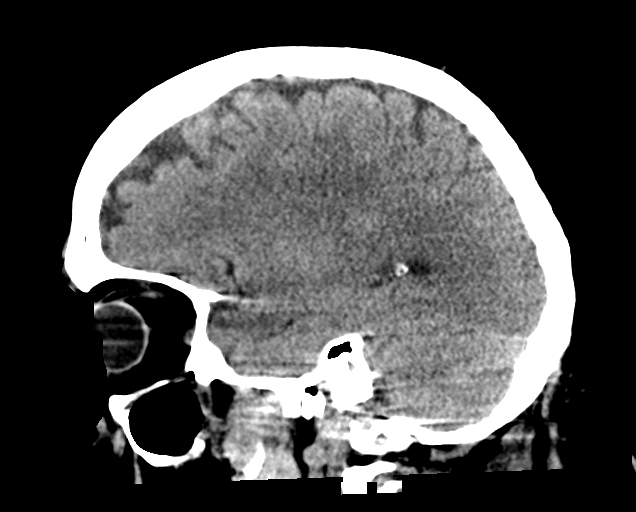

[16 of 47 positions shown; findings below may reference images not displayed]

FINDINGS: Brain: No evidence of acute intracranial hemorrhage or extra-axial
collection.No evidence of mass lesion/concern mass effect.The
ventricles are normal in size.

Vascular: No hyperdense vessel or unexpected calcification.

Skull: Normal. Negative for fracture or focal lesion.

Sinuses/Orbits: Mild ethmoid air cell mucosal thickening.

Other: None.
IMPRESSION: No acute intracranial abnormality.

## 2021-01-12 IMAGING — CT CT ANGIO CHEST
2 of 6 series · 18 of 46 positions shown · IV contrast (Omnipaque or Isovue)
Comparison: Same day chest radiograph, CT chest [DATE]

CLINICAL DATA: Lethargy, edema, confusion, concern for PE

EXAM:
CT ANGIOGRAPHY CHEST WITH CONTRAST
TECHNIQUE: Multidetector CT imaging of the chest was performed using the
standard protocol during bolus administration of intravenous
contrast. Multiplanar CT image reconstructions and MIPs were
obtained to evaluate the vascular anatomy.
CONTRAST:  75mL OMNIPAQUE IOHEXOL 350 MG/ML SOLN

[Series 5: pe axial thins · axial · 0.84mm/px · z∈[+1425,+1712]mm · 15 of 393 slices shown]
[im 17/393  lung]
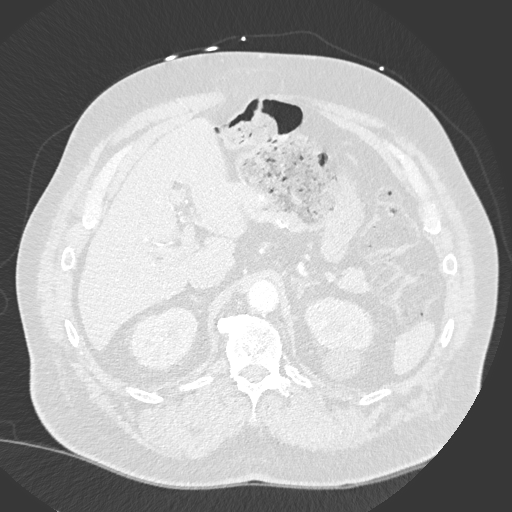
[im 50/393  soft-tissue]
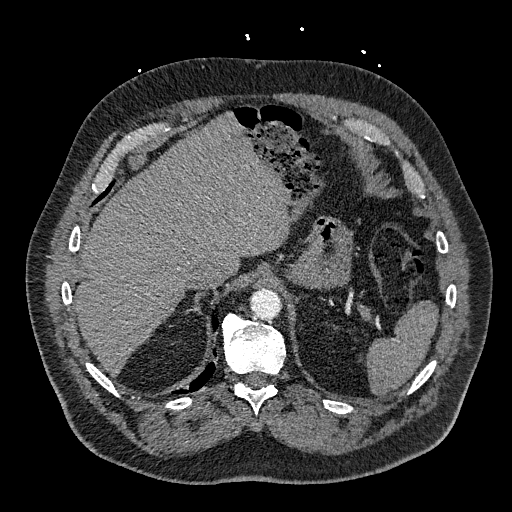
[im 66/393  lung]
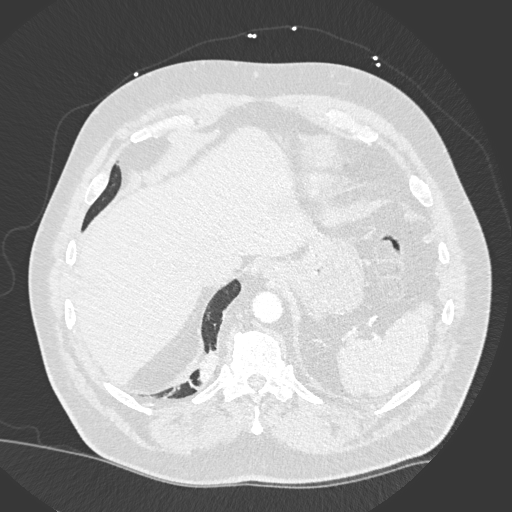
[im 99/393  soft-tissue]
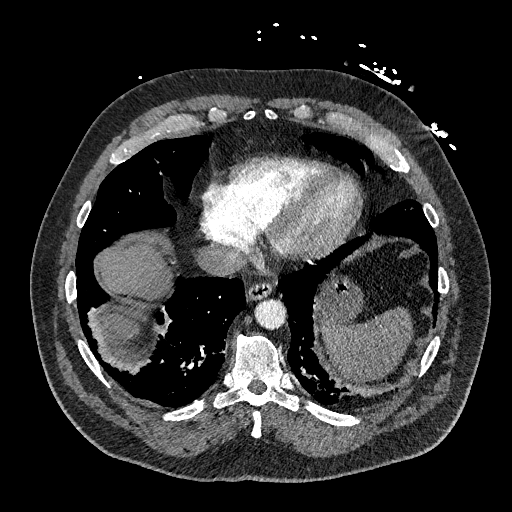
[im 115/393  lung]
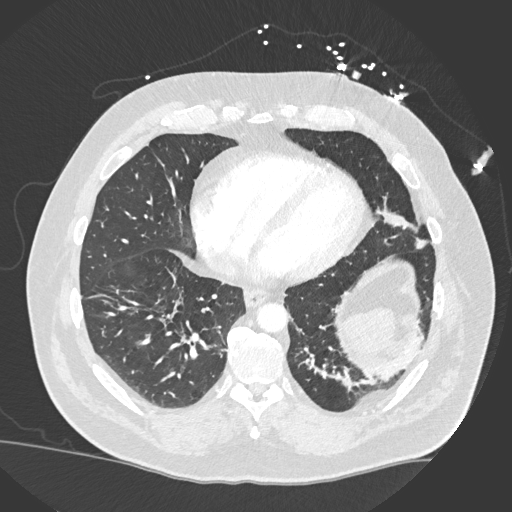
[im 148/393  soft-tissue]
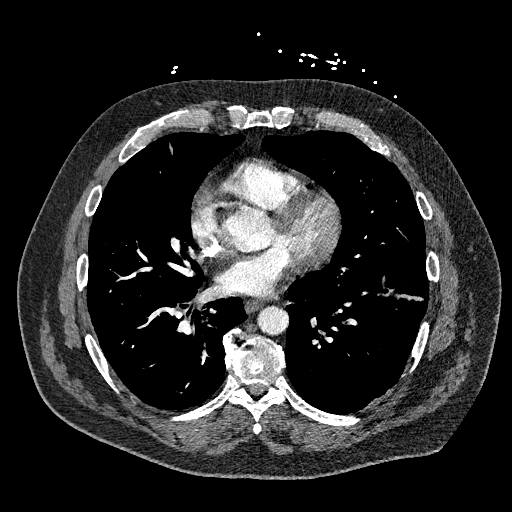
[im 164/393  lung]
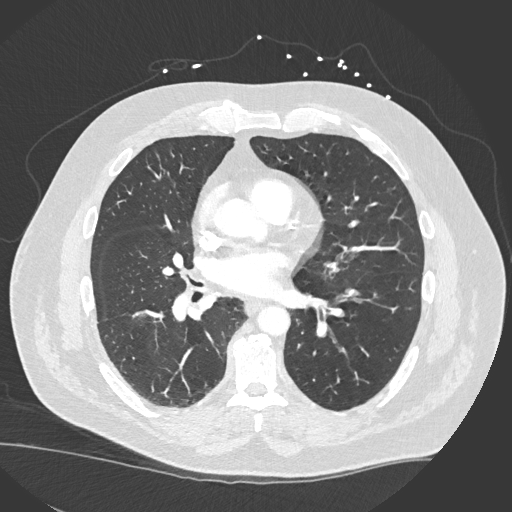
[im 197/393  soft-tissue]
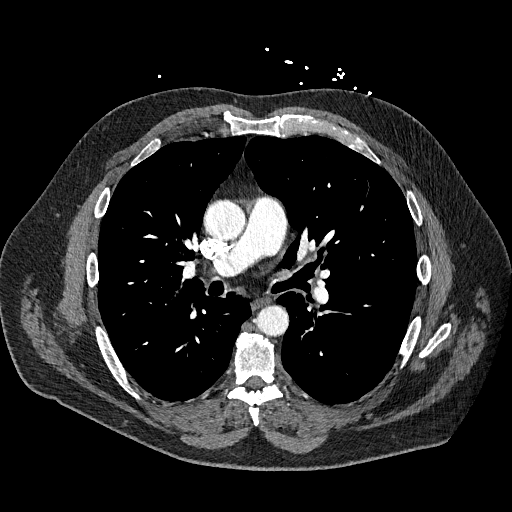
[im 229/393  lung]
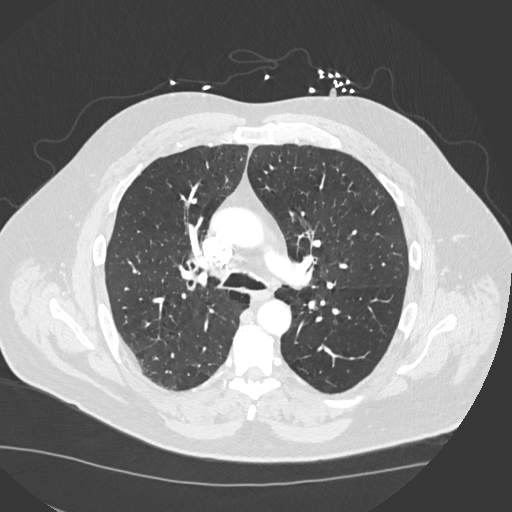
[im 245/393  soft-tissue]
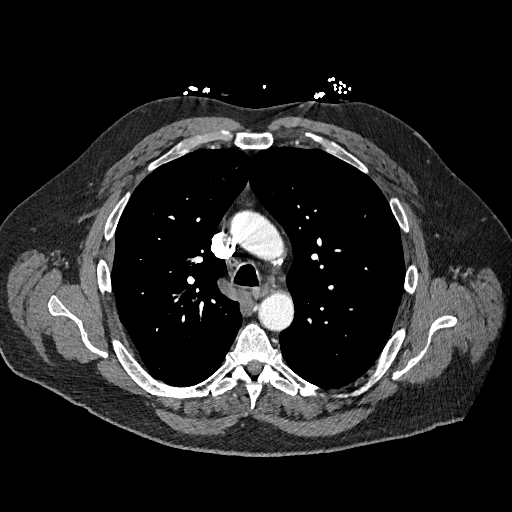
[im 278/393  lung]
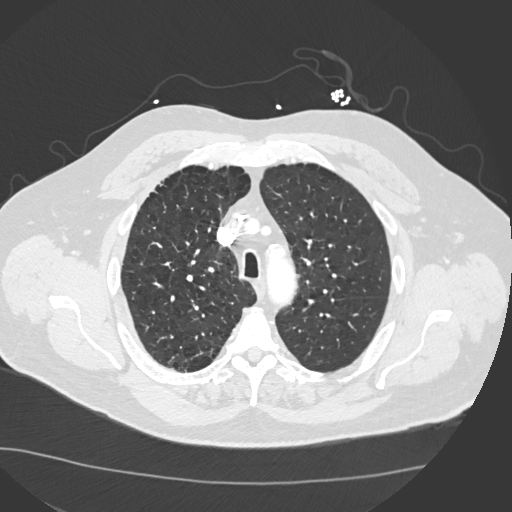
[im 295/393  soft-tissue]
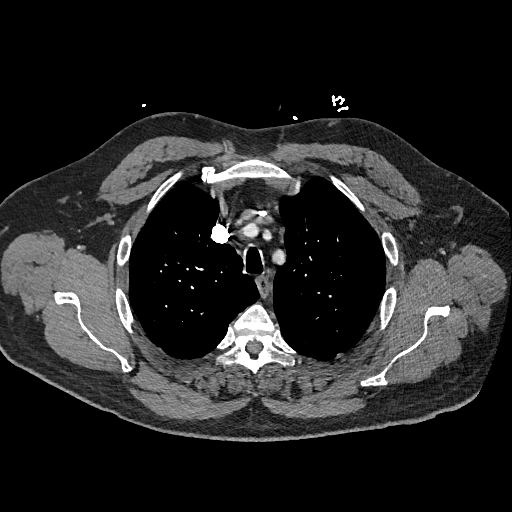
[im 327/393  lung]
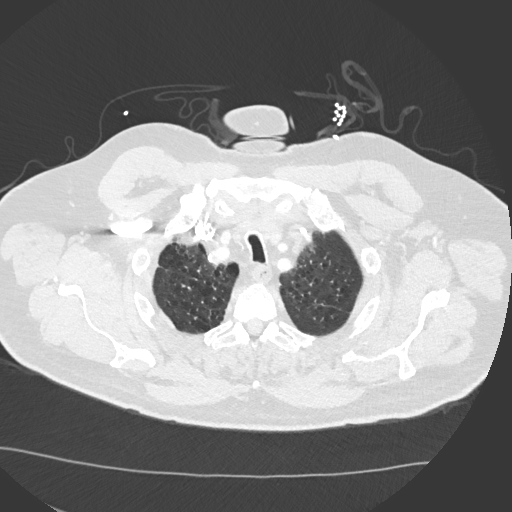
[im 344/393  soft-tissue]
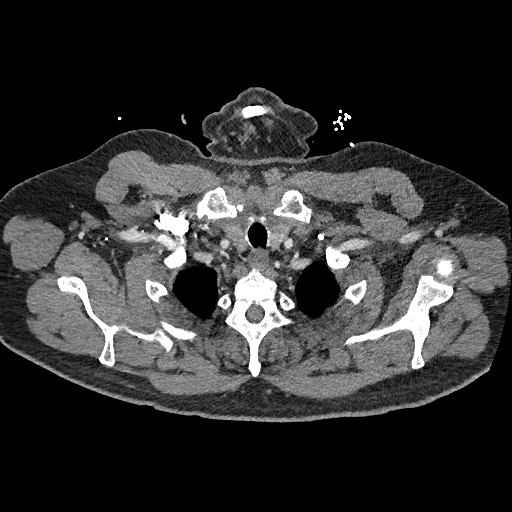
[im 376/393  lung]
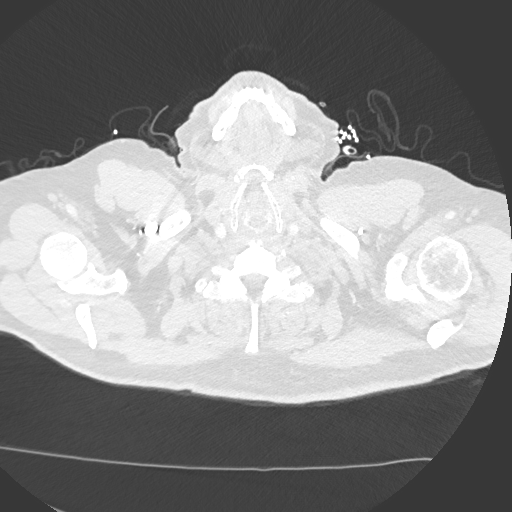

[Series 7: cor soft · coronal · 0.64mm/px · 3 of 184 slices shown]
[im 46/184  soft-tissue]
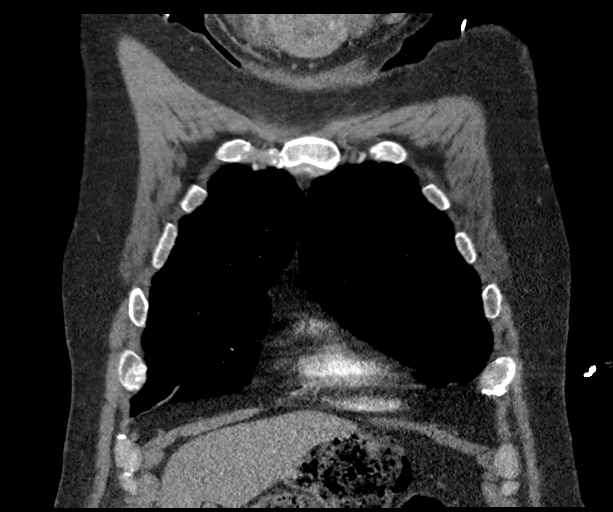
[im 92/184  soft-tissue]
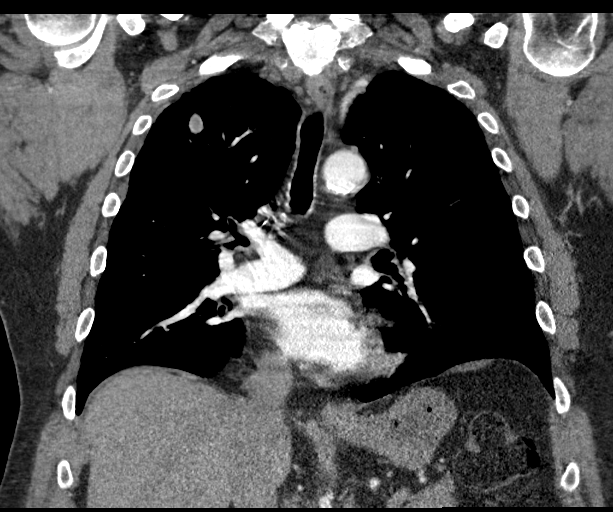
[im 138/184  soft-tissue]
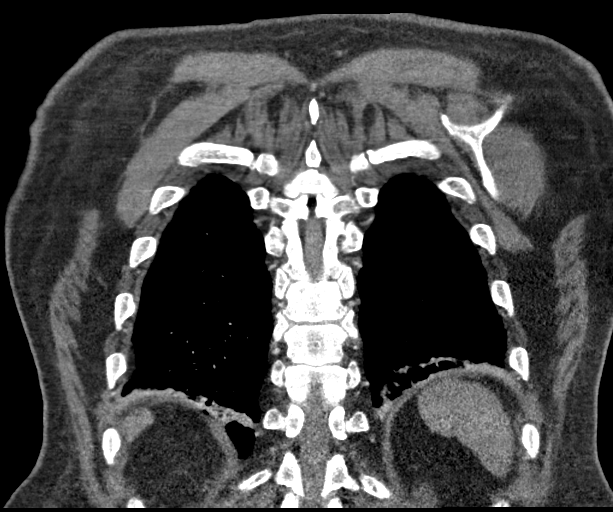

[18 of 46 positions shown; findings below may reference images not displayed]

FINDINGS: Cardiovascular: There is adequate opacification of the pulmonary
arteries to the segmental level. There is no evidence of acute
pulmonary embolism. The heart is not enlarged. There is no
pericardial effusion. Coronary artery calcifications and calcified
atherosclerotic plaque of the aortic arch are noted.

Mediastinum/Nodes: The imaged thyroid is unremarkable. The esophagus
is grossly unremarkable. There is no mediastinal, hilar, or axillary
lymphadenopathy.

Lungs/Pleura: The trachea and central airways are patent.

There is a background of centrilobular emphysema. Linear opacities
in the lung bases likely reflect atelectasis. There is no focal
consolidation or pulmonary edema. There is no pleural effusion or
pneumothorax.

The 1.2 cm x 0.8 cm nodule in the right lung apex is unchanged since
[08] and decreased in size since [08], likely benign.

Upper Abdomen: Exophytic left renal cysts are noted. There are no
acute findings in the imaged upper abdomen.

Musculoskeletal: There is no acute osseous abnormality or aggressive
osseous lesion.

Review of the MIP images confirms the above findings.
IMPRESSION: 1. No evidence of pulmonary embolism to the segmental level.
2. No focal consolidation or pleural effusion.
3. Bibasilar subsegmental atelectasis.
4. Coronary artery calcifications, aortic Atherosclerosis
([08]-[08]), and Emphysema ([08]-[08]).

## 2021-01-12 IMAGING — DX DG CHEST 2V
2 series · 2 of 2 positions shown · non-contrast
Comparison: Chest x-ray dated [DATE].

CLINICAL DATA: Edema with wheezing.

EXAM:
CHEST - 2 VIEW

[chest lat]
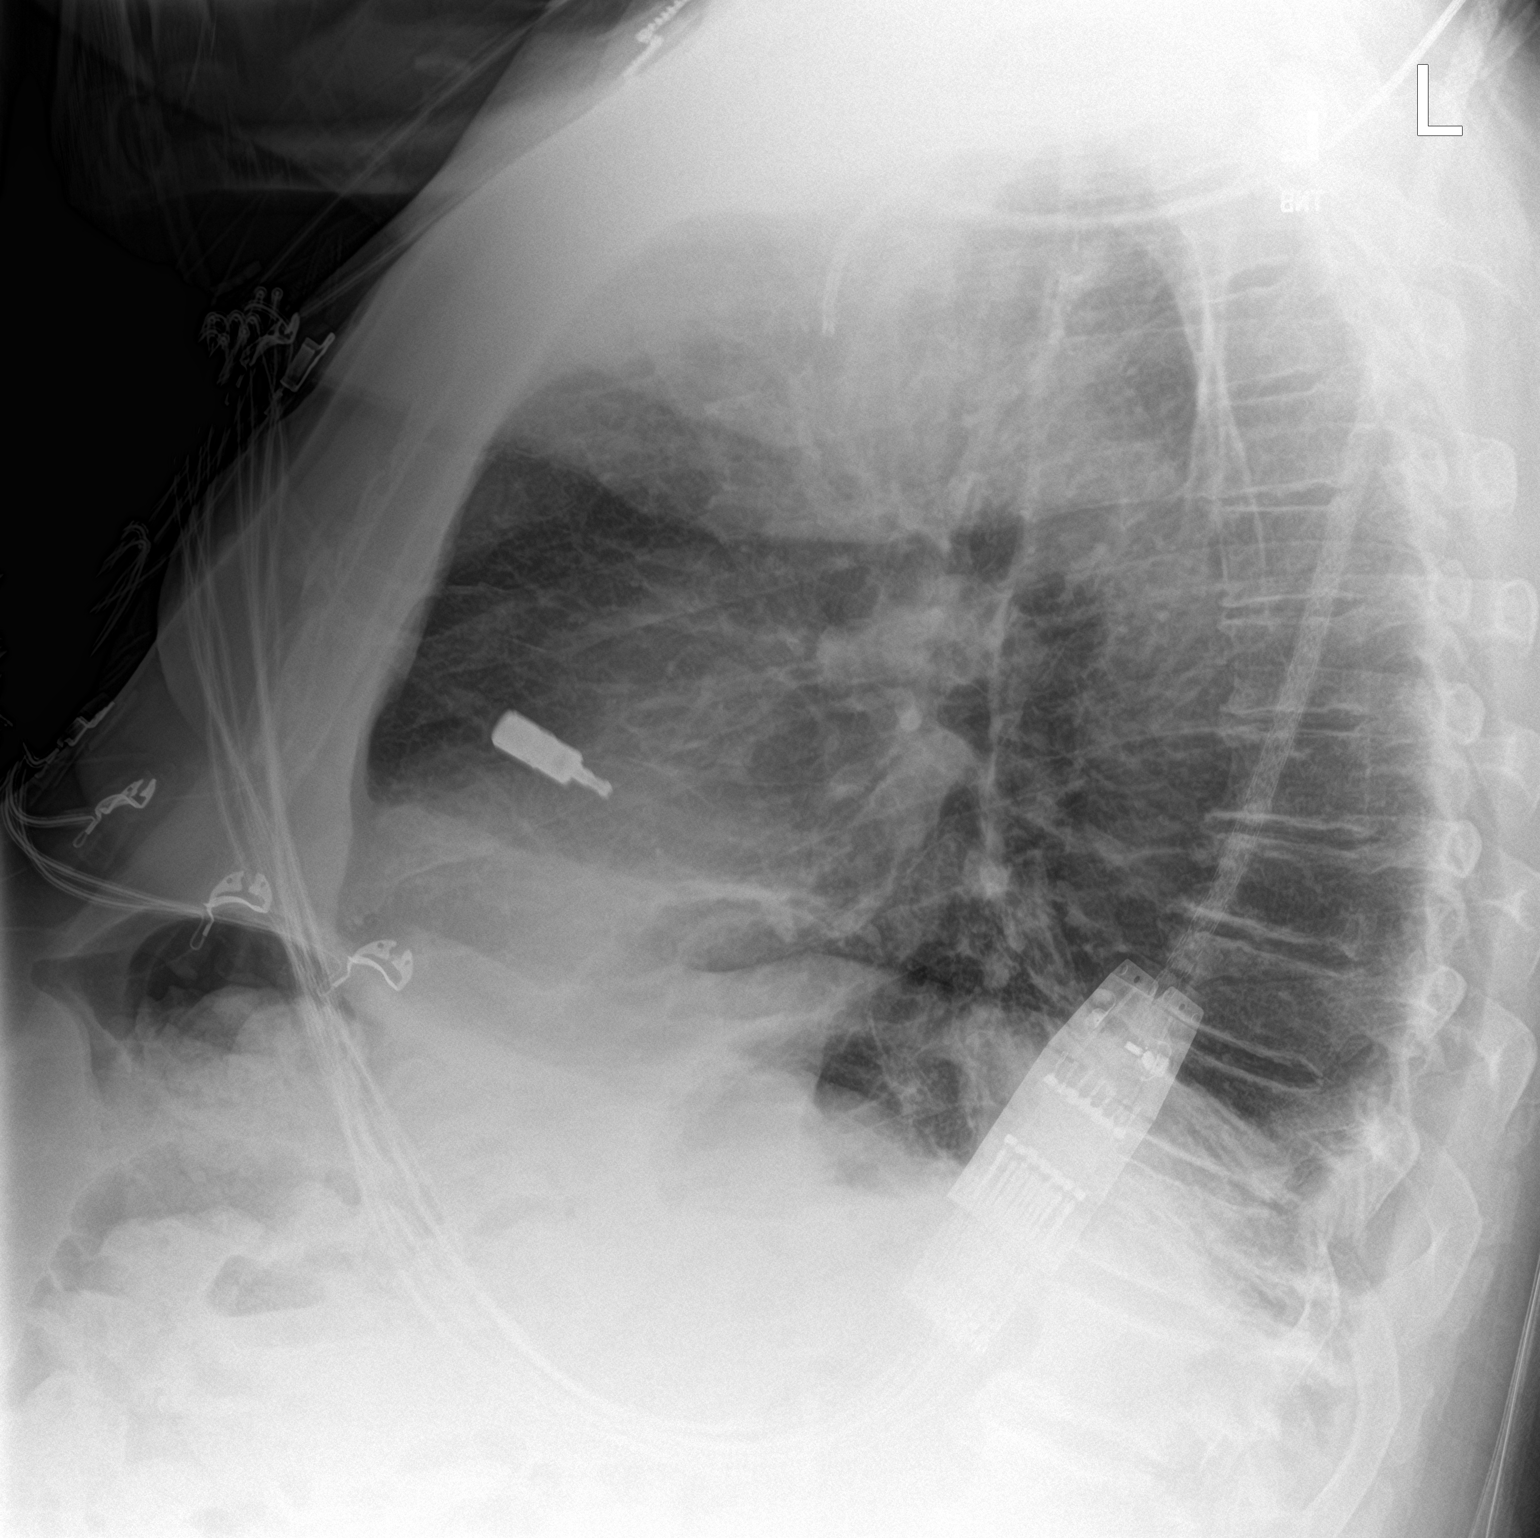

[chest ap]
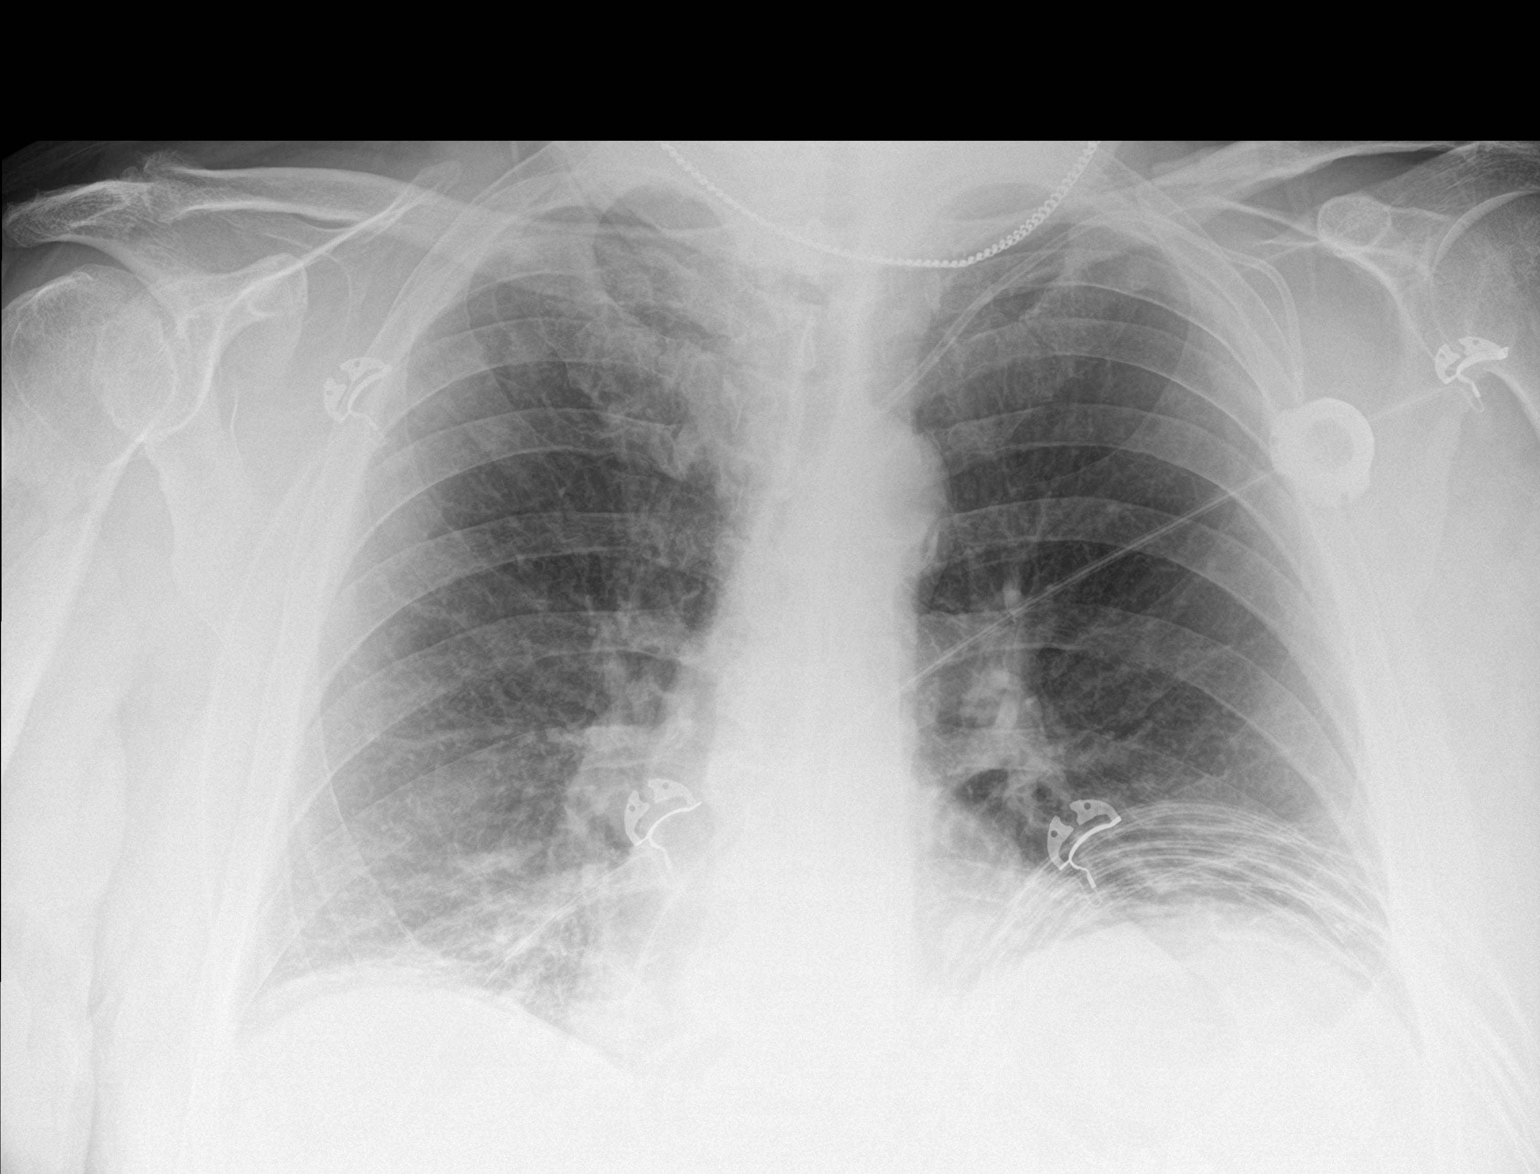

[2 of 2 positions shown; findings below may reference images not displayed]

FINDINGS: Unchanged left chest wall port catheter. The heart size and
mediastinal contours are within normal limits. Normal pulmonary
vascularity. Low lung volumes with bibasilar linear
atelectasis/scarring. Unchanged right upper lobe nodule, stable
since at least [Z4]. No focal consolidation, pleural effusion, or
pneumothorax. No acute osseous abnormality.
IMPRESSION: 1. No active disease.  Bibasilar atelectasis/scarring.

## 2021-01-12 MED ORDER — PREDNISONE 20 MG PO TABS: 40.0000 mg | ORAL_TABLET | Freq: Every day | ORAL | Status: DC

## 2021-01-12 MED ORDER — METHYLPREDNISOLONE SODIUM SUCC 125 MG IJ SOLR
60.0000 mg | Freq: Two times a day (BID) | INTRAMUSCULAR | Status: DC
  Administered 2021-01-12: 60 mg via INTRAVENOUS
  Filled 2021-01-12 (×2): qty 2

## 2021-01-12 MED ORDER — ENOXAPARIN SODIUM 40 MG/0.4ML IJ SOSY
40.0000 mg | PREFILLED_SYRINGE | INTRAMUSCULAR | Status: DC
  Administered 2021-01-12 – 2021-01-14 (×3): 40 mg via SUBCUTANEOUS
  Filled 2021-01-12 (×3): qty 0.4

## 2021-01-12 MED ORDER — OXYCODONE-ACETAMINOPHEN 5-325 MG PO TABS
1.0000 | ORAL_TABLET | Freq: Four times a day (QID) | ORAL | Status: DC | PRN
  Administered 2021-01-12 – 2021-01-14 (×5): 1 via ORAL
  Filled 2021-01-12 (×6): qty 1

## 2021-01-12 MED ORDER — ATORVASTATIN CALCIUM 20 MG PO TABS
20.0000 mg | ORAL_TABLET | Freq: Every evening | ORAL | Status: DC
  Administered 2021-01-13 – 2021-01-14 (×2): 20 mg via ORAL
  Filled 2021-01-12 (×2): qty 1

## 2021-01-12 MED ORDER — ONDANSETRON HCL 4 MG PO TABS: 4.0000 mg | ORAL_TABLET | Freq: Four times a day (QID) | ORAL | Status: DC | PRN

## 2021-01-12 MED ORDER — GABAPENTIN 300 MG PO CAPS
300.0000 mg | ORAL_CAPSULE | Freq: Three times a day (TID) | ORAL | Status: DC
  Administered 2021-01-12 – 2021-01-13 (×2): 300 mg via ORAL
  Filled 2021-01-12 (×2): qty 1

## 2021-01-12 MED ORDER — ALBUTEROL SULFATE (2.5 MG/3ML) 0.083% IN NEBU: 2.5000 mg | INHALATION_SOLUTION | RESPIRATORY_TRACT | Status: DC | PRN

## 2021-01-12 MED ORDER — IPRATROPIUM-ALBUTEROL 0.5-2.5 (3) MG/3ML IN SOLN
3.0000 mL | Freq: Four times a day (QID) | RESPIRATORY_TRACT | Status: DC
  Administered 2021-01-12 – 2021-01-15 (×12): 3 mL via RESPIRATORY_TRACT
  Filled 2021-01-12 (×12): qty 3

## 2021-01-12 MED ORDER — IPRATROPIUM-ALBUTEROL 0.5-2.5 (3) MG/3ML IN SOLN
3.0000 mL | Freq: Once | RESPIRATORY_TRACT | Status: AC
  Administered 2021-01-12: 3 mL via RESPIRATORY_TRACT
  Filled 2021-01-12: qty 3

## 2021-01-12 MED ORDER — NALOXONE HCL 0.4 MG/ML IJ SOLN
0.4000 mg | Freq: Once | INTRAMUSCULAR | Status: AC
  Administered 2021-01-12: 0.4 mg via INTRAVENOUS
  Filled 2021-01-12: qty 1

## 2021-01-12 MED ORDER — SODIUM CHLORIDE 0.9 % IV SOLN: INTRAVENOUS | Status: DC | PRN

## 2021-01-12 MED ORDER — METHYLPREDNISOLONE SODIUM SUCC 125 MG IJ SOLR
125.0000 mg | Freq: Once | INTRAMUSCULAR | Status: AC
  Administered 2021-01-12: 125 mg via INTRAVENOUS
  Filled 2021-01-12: qty 2

## 2021-01-12 MED ORDER — ONDANSETRON HCL 4 MG/2ML IJ SOLN: 4.0000 mg | Freq: Four times a day (QID) | INTRAMUSCULAR | Status: DC | PRN

## 2021-01-12 MED ORDER — ALBUTEROL SULFATE (2.5 MG/3ML) 0.083% IN NEBU
INHALATION_SOLUTION | RESPIRATORY_TRACT | Status: AC
  Administered 2021-01-12: 2.5 mg via RESPIRATORY_TRACT
  Filled 2021-01-12: qty 3

## 2021-01-12 MED ORDER — ALBUTEROL SULFATE (2.5 MG/3ML) 0.083% IN NEBU: 2.5000 mg | INHALATION_SOLUTION | Freq: Once | RESPIRATORY_TRACT | Status: AC

## 2021-01-12 MED ORDER — BUMETANIDE 1 MG PO TABS
1.0000 mg | ORAL_TABLET | Freq: Every day | ORAL | Status: DC
  Administered 2021-01-13 – 2021-01-15 (×3): 1 mg via ORAL
  Filled 2021-01-12 (×3): qty 1

## 2021-01-12 MED ORDER — IOHEXOL 350 MG/ML SOLN
75.0000 mL | Freq: Once | INTRAVENOUS | Status: AC | PRN
  Administered 2021-01-12: 75 mL via INTRAVENOUS

## 2021-01-12 MED ORDER — CHLORTHALIDONE 25 MG PO TABS
25.0000 mg | ORAL_TABLET | Freq: Every day | ORAL | Status: DC
  Administered 2021-01-12: 25 mg via ORAL
  Filled 2021-01-12 (×2): qty 1

## 2021-01-12 MED ORDER — IBUPROFEN 800 MG PO TABS: 800.0000 mg | ORAL_TABLET | Freq: Three times a day (TID) | ORAL | Status: DC | PRN

## 2021-01-12 MED ORDER — TAMSULOSIN HCL 0.4 MG PO CAPS
0.4000 mg | ORAL_CAPSULE | Freq: Every day | ORAL | Status: DC
  Administered 2021-01-12 – 2021-01-15 (×4): 0.4 mg via ORAL
  Filled 2021-01-12 (×4): qty 1

## 2021-01-12 NOTE — ED Provider Notes (Signed)
[name] Provider Note   CSN: [id number] Arrival date & time: 01/12/21  1123     History No chief complaint on file.   [name] is a 65 y.o. male.  HPI  With firm HPI due to level 5 caveat altered mental status.  Patient with medical history including colon cancer with hemicolectomy, COPD, neuralgia presents the emergency department with chief complaint of altered mental status.  Daughter was at bedside able to validate the story.  She states that she noted that her father was slightly altered this morning, she states that he appeared to be very somnolent, would fall asleep as he was talking, appear to be slightly off balance and just has generalized weakness, states that his speech is slightly off but just sounds like he is mumbling.  She states that he has had no recent falls, no fevers, chills, nasal ingestion, sore throat, he has at baseline a cough which is nonproductive, he still eating and drinking, having normal bowel movements.  She states that recently he had an increase in his pain medications, he normally takes gabapentin 200 3  times daily but is now taking  300 mg 3 times daily, she states that he is also has narcotic medication but states that he ran out of it a couple days ago.  She also notes that patient has history of peripheral edema, states that he is taking fluid pill, he has no history of CHF, she states that he did drink heavily in the past, has never had a cardiac work-up.  Patient states that he has no complaints this time, and feels fine.  He states that he took his normal medications, did not take more than he usually takes denies any illicit drug use.  Past Medical History:  Diagnosis Date   Anemia    IRON 2005   Anxiety    Arthritis    LOWER LUMBAR STENOSIS , ARTHRITIS HANDS AND SHOULDERS   Asthma    Cancer ([place]) 2005   COLON SURGERY AND CHEMO   Complication of anesthesia    WOKE UP DURING PORT PLACEMENT AND ENDOSCOPY,  SLOW TO AWAKEN FROM COLON RESECTION 2010   COPD (chronic obstructive pulmonary disease) (HCC)    Dyspnea    WITH EXERTION   GERD (gastroesophageal reflux disease)    Hydrocele, right    Neuropathy    FINGERS   Pneumonia 2016 LAST    X 3 EPISODES IN PAST   Vitamin D deficiency     Patient Active Problem List   Diagnosis Date Noted   Dyspnea 03/20/2017   Ventral hernia 03/20/2017    Past Surgical History:  Procedure Laterality Date   COLON RESECTION  2010   HERNIA REPAIR     HYDROCELE EXCISION Right 12/16/2016   Procedure: HYDROCELECTOMY ADULT RGHT;  Surgeon: Lucas Mallow, MD;  Location: [facility];  Service: Urology;  Laterality: Right;   PORT PLACEMENT     LEFT CHEST   UPPER GI ENDOSCOPY         No family history on file.  Social History   Tobacco Use   Smoking status: Every Day    Packs/day: 1.00    Years: 45.00    Pack years: 45.00    Types: Cigarettes   Smokeless tobacco: Never  Vaping Use   Vaping Use: Never used  Substance Use Topics   Alcohol use: No   Drug use: No    Home Medications Prior to Admission  medications   Medication Sig Start Date End Date Taking? Authorizing Provider  albuterol (PROVENTIL HFA;VENTOLIN HFA) 108 (90 Base) MCG/ACT inhaler Inhale 1-2 puffs into the lungs every 6 (six) hours as needed for wheezing or shortness of breath.    [provider]  aspirin EC 325 MG tablet Take 1,300 mg by mouth 2 (two) times daily as needed for mild pain or moderate pain.    [provider]  atorvastatin (LIPITOR) 20 MG tablet Take 20 mg by mouth daily.    [provider]  bumetanide (BUMEX) 1 MG tablet Take 1 mg by mouth daily. 12/13/20   [provider]  calcium carbonate (TUMS - DOSED IN MG ELEMENTAL CALCIUM) 500 MG chewable tablet Chew 1 tablet as needed by mouth for indigestion or heartburn.    [provider]  chlorthalidone (HYGROTON) 25 MG tablet Take 25 mg by mouth daily.  12/13/20   [provider]  clindamycin (CLEOCIN) 300 MG capsule Take 300 mg by mouth 4 (four) times daily. 12/24/20   [provider]  gabapentin (NEURONTIN) 300 MG capsule Take 300 mg by mouth 3 (three) times daily. 12/18/20   [provider]  HYDROcodone-acetaminophen (NORCO/VICODIN) 5-325 MG tablet Take 1 tablet by mouth every 4 (four) hours as needed. Patient not taking: Reported on 12/29/2020 12/15/20   Isla Pence, MD  ibuprofen (ADVIL) 800 MG tablet Take 800 mg by mouth every 8 (eight) hours as needed.    [provider]  ipratropium-albuterol (DUONEB) 0.5-2.5 (3) MG/3ML SOLN Take 3 mLs by nebulization every 6 (six) hours as needed (for shortness of breath).    [provider]  lisinopril (ZESTRIL) 10 MG tablet Take 10 mg by mouth daily. 12/13/20   [provider]  nystatin (MYCOSTATIN/NYSTOP) powder Apply topically 4 (four) times daily. Until three days after rash improves Patient not taking: Reported on 12/29/2020 09/09/16   Mesner, [name], MD  oxyCODONE-acetaminophen (PERCOCET/ROXICET) 5-325 MG tablet Take 1 tablet by mouth every 6 (six) hours as needed for severe pain or moderate pain. 12/29/20   Daleen Bo, MD  predniSONE (DELTASONE) 10 MG tablet Take q day 6,5,4,3,2,1 12/29/20   Daleen Bo, MD  tamsulosin (FLOMAX) 0.4 MG CAPS capsule Take 0.4 mg by mouth daily. 12/13/20   [provider]    Allergies    Patient has no known allergies.  Review of Systems   Review of Systems  Unable to perform ROS: Mental status change   Physical Exam Updated Vital Signs BP (!) 121/59    Pulse 96    Temp 98.7 F (37.1 C) (Rectal)    Resp 17    SpO2 92%   Physical Exam Vitals and nursing note reviewed.  Constitutional:      General: He is not in acute distress.    Appearance: He is not ill-appearing.     Comments: He is alert, does not show signs of distress, he is alert and orient x4 but takes longer to respond, appears  almost somnolent.  Vital signs notable for tachypnea and hypoxia requiring supplemental O2.  HENT:     Head: Normocephalic and atraumatic.     Nose: No congestion.     Mouth/Throat:     Mouth: Mucous membranes are moist.     Pharynx: Oropharynx is clear. No oropharyngeal exudate or posterior oropharyngeal erythema.  Eyes:     General: No visual field deficit.    Extraocular Movements: Extraocular movements intact.     Conjunctiva/sclera: Conjunctivae normal.  Pupils: Pupils are equal, round, and reactive to light.  Cardiovascular:     Rate and Rhythm: Normal rate and regular rhythm.     Pulses: Normal pulses.     Heart sounds: No murmur heard.   No friction rub. No gallop.  Pulmonary:     Effort: No respiratory distress.     Breath sounds: No wheezing, rhonchi or rales.     Comments: Patient does not appear to be in respiratory distress, able to speak in full sentences, nontachypneic, but is requiring 3 L via nasal cannula, nasal cannula was removed and becomes hypoxic while sitting in bed and talking.  Lung sounds were tight sounding, bibasilar rales present, no rhonchi or stridor present. Abdominal:     Palpations: Abdomen is soft.     Tenderness: There is no abdominal tenderness. There is no right CVA tenderness or left CVA tenderness.  Musculoskeletal:     Right lower leg: Edema present.     Left lower leg: Edema present.     Comments: Patient is able to move all 4 extremities, 5 of 5 strength in all 4 extremities.  He has 2+ pitting edema up into the kneecaps, no chronic skin changes present.  Neurovascularly intact.  Skin:    General: Skin is warm and dry.  Neurological:     Mental Status: He is alert.     GCS: GCS eye subscore is 4. GCS verbal subscore is 5. GCS motor subscore is 6.     Cranial Nerves: Cranial nerves 2-12 are intact. No cranial nerve deficit or facial asymmetry.     Sensory: Sensation is intact.     Motor: No weakness.     Coordination: Romberg sign  negative. Finger-Nose-Finger Test normal.     Comments: No facial asymmetry, no difficult word finding, no slurring of the words, able follow two-step commands, no unilateral weakness present.  Sensation fully intact, no visual field deficits present.  While occasional upper extremity tremors last 1 to 2 seconds then resolved.  Psychiatric:        Mood and Affect: Mood normal.    ED Results / Procedures / Treatments   Labs (all labs ordered are listed, but only abnormal results are displayed) Labs Reviewed  COMPREHENSIVE METABOLIC PANEL - Abnormal; Notable for the following components:      Result Value   Sodium 129 (*)    Chloride 88 (*)    Creatinine, Ser 2.06 (*)    Total Protein 6.3 (*)    Albumin 3.4 (*)    GFR, Estimated 35 (*)    All other components within normal limits  CBC WITH DIFFERENTIAL/PLATELET - Abnormal; Notable for the following components:   RBC 4.02 (*)    MCV 101.5 (*)    All other components within normal limits  URINALYSIS, ROUTINE W REFLEX MICROSCOPIC - Abnormal; Notable for the following components:   APPearance HAZY (*)    Leukocytes,Ua TRACE (*)    All other components within normal limits  RAPID URINE DRUG SCREEN, HOSP PERFORMED - Abnormal; Notable for the following components:   Opiates POSITIVE (*)    All other components within normal limits  BLOOD GAS, VENOUS - Abnormal; Notable for the following components:   pCO2, Ven 62.0 (*)    pO2, Ven 31.1 (*)    Bicarbonate 29.0 (*)    Acid-Base Excess 7.7 (*)    All other components within normal limits  RESP PANEL BY RT-PCR (FLU A&B, COVID) ARPGX2  LIPASE, BLOOD  LACTIC ACID, PLASMA  ETHANOL  BRAIN NATRIURETIC PEPTIDE  URINALYSIS, MICROSCOPIC (REFLEX)  CBG MONITORING, ED  TROPONIN I (HIGH SENSITIVITY)    EKG None  Radiology DG Chest 2 View  Result Date: 01/12/2021 CLINICAL DATA:  Edema with wheezing. EXAM: CHEST - 2 VIEW COMPARISON:  Chest x-ray dated December 29, 2020. FINDINGS: Unchanged  left chest wall port catheter. The heart size and mediastinal contours are within normal limits. Normal pulmonary vascularity. Low lung volumes with bibasilar linear atelectasis/scarring. Unchanged right upper lobe nodule, stable since at least 2018. No focal consolidation, pleural effusion, or pneumothorax. No acute osseous abnormality. IMPRESSION: 1. No active disease.  Bibasilar atelectasis/scarring. Electronically Signed   By: Titus Dubin M.D.   On: 01/12/2021 15:11   CT Head Wo Contrast  Result Date: 01/12/2021 CLINICAL DATA:  Mental status change, unknown cause EXAM: CT HEAD WITHOUT CONTRAST TECHNIQUE: Contiguous axial images were obtained from the base of the skull through the vertex without intravenous contrast. COMPARISON:  Head CT 01/02/2012 FINDINGS: Brain: No evidence of acute intracranial hemorrhage or extra-axial collection.No evidence of mass lesion/concern mass effect.The ventricles are normal in size. Vascular: No hyperdense vessel or unexpected calcification. Skull: Normal. Negative for fracture or focal lesion. Sinuses/Orbits: Mild ethmoid air cell mucosal thickening. Other: None. IMPRESSION: No acute intracranial abnormality. Electronically Signed   By: Maurine Simmering M.D.   On: 01/12/2021 14:38   CT Angio Chest PE W and/or Wo Contrast  Result Date: 01/12/2021 CLINICAL DATA:  Lethargy, edema, confusion, concern for PE EXAM: CT ANGIOGRAPHY CHEST WITH CONTRAST TECHNIQUE: Multidetector CT imaging of the chest was performed using the standard protocol during bolus administration of intravenous contrast. Multiplanar CT image reconstructions and MIPs were obtained to evaluate the vascular anatomy. CONTRAST:  24mL OMNIPAQUE IOHEXOL 350 MG/ML SOLN COMPARISON:  Same day chest radiograph, CT chest 11/09/2016 FINDINGS: Cardiovascular: There is adequate opacification of the pulmonary arteries to the segmental level. There is no evidence of acute pulmonary embolism. The heart is not enlarged. There  is no pericardial effusion. Coronary artery calcifications and calcified atherosclerotic plaque of the aortic arch are noted. Mediastinum/Nodes: The imaged thyroid is unremarkable. The esophagus is grossly unremarkable. There is no mediastinal, hilar, or axillary lymphadenopathy. Lungs/Pleura: The trachea and central airways are patent. There is a background of centrilobular emphysema. Linear opacities in the lung bases likely reflect atelectasis. There is no focal consolidation or pulmonary edema. There is no pleural effusion or pneumothorax. The 1.2 cm x 0.8 cm nodule in the right lung apex is unchanged since 2018 and decreased in size since 2016, likely benign. Upper Abdomen: Exophytic left renal cysts are noted. There are no acute findings in the imaged upper abdomen. Musculoskeletal: There is no acute osseous abnormality or aggressive osseous lesion. Review of the MIP images confirms the above findings. IMPRESSION: 1. No evidence of pulmonary embolism to the segmental level. 2. No focal consolidation or pleural effusion. 3. Bibasilar subsegmental atelectasis. 4. Coronary artery calcifications, aortic Atherosclerosis (ICD10-I70.0), and Emphysema (ICD10-J43.9). Electronically Signed   By: Valetta Mole M.D.   On: 01/12/2021 16:06    Procedures .Critical Care Performed by: Marcello Fennel, PA-C Authorized by: Marcello Fennel, PA-C   Critical care provider statement:    Critical care time (minutes):  30   Critical care time was exclusive of:  Separately billable procedures and treating other patients   Critical care was necessary to treat or prevent imminent or life-threatening deterioration of the following conditions:  Circulatory failure  Critical care was time spent personally by me on the following activities:  Development of treatment plan with patient or surrogate, discussions with consultants, evaluation of patient's response to treatment, examination of patient, ordering and review of  laboratory studies, ordering and review of radiographic studies, ordering and performing treatments and interventions, pulse oximetry, re-evaluation of patient's condition and review of old charts   I assumed direction of critical care for this patient from another provider in my specialty: no     Care discussed with: admitting provider     Medications Ordered in ED Medications  iohexol (OMNIPAQUE) 350 MG/ML injection 75 mL (75 mLs Intravenous Contrast Given 01/12/21 1541)  ipratropium-albuterol (DUONEB) 0.5-2.5 (3) MG/3ML nebulizer solution 3 mL (3 mLs Nebulization Given 01/12/21 1623)  naloxone (NARCAN) injection 0.4 mg (0.4 mg Intravenous Given 01/12/21 1702)  albuterol (PROVENTIL) (2.5 MG/3ML) 0.083% nebulizer solution 2.5 mg (2.5 mg Nebulization Given 01/12/21 1623)  methylPREDNISolone sodium succinate (SOLU-MEDROL) 125 mg/2 mL injection 125 mg (125 mg Intravenous Given 01/12/21 1812)    ED Course  I have reviewed the triage vital signs and the nursing notes.  Pertinent labs & imaging results that were available during my care of the patient were reviewed by me and considered in my medical decision making (see chart for details).    MDM Rules/Calculators/A&P                         Initial impression-presents with altered mental status.  He is alert, no acute distress, vital signs reassuring.  Unclear etiologyWill obtain basic lab work-up, CT head and reassess.  Work-up-CBC is unremarkable, CMP shows sodium of 129, chloride 88, creatinine 2.06 at baseline, GFR 35, BNP 38, lipase 18, ethanol less than 10, UA shows trace leukocytes, respiratory panel negative, rapid urine drug screen positive for opiates, troponin is 9, CT head negative for acute findings.  Chest x-ray negative acute findings, CTA of chest negative for acute findings.  Reassessment-CT head was negative for acute findings, still appears to be slightly altered, so requiring 2 L via nasal cannula, chest continues sound tight  sounding  will provide him with DuoNeb and Solu-Medrol.  We will also obtain CT of chest to rule out possible PE.  CTA of chest is negative for acute findings, patient was reassessed lung sounds have improved but still is hypoxic, will provide him Narcan and reassess.  Patient is reassessed he is at his baseline, rapid urine drug  is positive for opiates likely this is because of his somnolent state, suspect acute hypoxia secondary due to COPD exacerbation, will recommend admission due to increased oxygen requirements.  He is agreement this plan.  Consult spoke with Dr. Sheran Lawless who will admit the patient.  Rule out- low suspicion for internal head bleed and or mass as CT imaging is negative for acute findings.  Low suspicion for CVA she has no focal deficit present my exam.  Low suspicion for dissection of the vertebral or carotid artery as presentation atypical of etiology.  Low suspicion for meningitis as she has no meningeal sign present.  I have low suspicion for systemic infection as patient nontoxic-appearing, no leukocytosis, afebrile, nontachycardic, normal lactic.  I have low suspicion for pneumonia as such x-ray as well as CT of chest negative for acute findings.  Low suspicion for PE as CTA of chest is negative.  Low suspicion for ACS as patient denies chest pain, shortness of breath, EKG without signs of ischemia, first troponin  is 9 , Will defer second trop as patient is not chest pain-free for greater than 12 hours would expect elevation of troponins if ACS was present.  Low suspicion for CHF exacerbation as there is no pleural effusion seen on x-ray, BNP is 38, unclear etiology of peripheral edema possibly portal hypertension versus lymphedema, would recommend echo for further investigation.   Plan-admission  Altered mental status improved-mostly secondary to probably pharmacy, will recommend continue observation. Acute hypoxia-likely secondary due to COPD exacerbation, antibiotics were  deferred at this time as he has no increased sputum production, afebrile, no signs of infection present.  Recommend continuing with steroids as well as bronchodilators. Peripheral edema-unclear etiology possible portal hypertension, CHF, lymphedema would recommend echo for further investigation.     Final Clinical Impression(s) / ED Diagnoses Final diagnoses:  Altered mental status, unspecified altered mental status type  Acute on chronic respiratory failure with hypoxia [facility])  Peripheral edema    Rx / DC Orders ED Discharge Orders     None        Marcello Fennel, PA-C 01/12/21 1816    Truddie Hidden, MD 01/12/21 2306

## 2021-01-12 NOTE — H&P (Signed)
History and Physical  [name] WRU:[id number] DOB: 1955-09-24 DOA: 01/12/2021  Referring physician: Mindi [name], [name], EDP PCP: Alan Sender, FNP  Outpatient Specialists:   Patient Coming From: home  Chief Complaint: SOB  HPI: [name] is a 65 y.o. male with a history of history of colon cancer status post surgery and chemotherapy, COPD, GERD, chronic pain on chronic narcotics.  Patient seen for shortness of breath that started last night.  He was initially brought in due to altered mental status and history is obtained by the daughter as well as the patient.  This morning, she noticed that he was more altered, very somnolent, and falling asleep while she was talking to him.  He was off balance and had some mild generalized weakness.  No recent falls, no fevers, chills, nausea, vomiting.  He does have a base which is semiproductive.  The patient thinks his cough is slightly more often than his normal as well as feels that it is slightly looser than normal.  The patient is on gabapentin 300 mg 3 times a day, who is taking his narcotic medication, although he gets confused on his medications, but he does not feel like he took more than what he was supposed to.  Emergency Department Course: Chest x-ray shows bibasilar atelectasis.  No acute active disease.  CTA negative for PE.  Sodium slightly decreased to 129 creatinine at baseline at 2.06.  VBG was done showing a PCO2 of 62.  Patient was initially hypoxic on arrival with oxygen levels in the low 80s.  On 2 to 3 L his oxygen level goes up to about 92 to 93%.  He was given nebulizer treatment and steroids and had some mild improvement  Review of Systems:  Pt denies any fevers, chills, nausea, vomiting, diarrhea, constipation, abdominal pain, shortness of breath, dyspnea on exertion, orthopnea, cough, wheezing, palpitations, headache, vision changes, lightheadedness, dizziness, melena, rectal bleeding.  Review of  systems are otherwise negative  Past Medical History:  Diagnosis Date   Anemia    IRON 2005   Anxiety    Arthritis    LOWER LUMBAR STENOSIS , ARTHRITIS HANDS AND SHOULDERS   Asthma    Cancer ([place]) 2005   COLON SURGERY AND CHEMO   Complication of anesthesia    WOKE UP DURING PORT PLACEMENT AND ENDOSCOPY, SLOW TO AWAKEN FROM COLON RESECTION 2010   COPD (chronic obstructive pulmonary disease) (HCC)    Dyspnea    WITH EXERTION   GERD (gastroesophageal reflux disease)    Hydrocele, right    Neuropathy    FINGERS   Pneumonia 2016 LAST    X 3 EPISODES IN PAST   Vitamin D deficiency    Past Surgical History:  Procedure Laterality Date   COLON RESECTION  2010   HERNIA REPAIR     HYDROCELE EXCISION Right 12/16/2016   Procedure: HYDROCELECTOMY ADULT RGHT;  Surgeon: Alan Mallow, MD;  Location: [facility];  Service: Urology;  Laterality: Right;   PORT PLACEMENT     LEFT CHEST   UPPER GI ENDOSCOPY     Social History:  reports that he has been smoking cigarettes. He has a 45.00 pack-year smoking history. He has never used smokeless tobacco. He reports that he does not drink alcohol and does not use drugs. Patient lives at home  No Known Allergies  No family history on file.  Family history of hypertension  Prior to Admission medications   Medication Sig  Start Date End Date Taking? Authorizing Provider  albuterol (PROVENTIL HFA;VENTOLIN HFA) 108 (90 Base) MCG/ACT inhaler Inhale 1-2 puffs into the lungs every 6 (six) hours as needed for wheezing or shortness of breath.    [provider]  aspirin EC 325 MG tablet Take 1,300 mg by mouth 2 (two) times daily as needed for mild pain or moderate pain.    [provider]  atorvastatin (LIPITOR) 20 MG tablet Take 20 mg by mouth daily.    [provider]  bumetanide (BUMEX) 1 MG tablet Take 1 mg by mouth daily. 12/13/20   [provider]  calcium carbonate (TUMS - DOSED IN MG ELEMENTAL  CALCIUM) 500 MG chewable tablet Chew 1 tablet as needed by mouth for indigestion or heartburn.    [provider]  chlorthalidone (HYGROTON) 25 MG tablet Take 25 mg by mouth daily. 12/13/20   [provider]  clindamycin (CLEOCIN) 300 MG capsule Take 300 mg by mouth 4 (four) times daily. 12/24/20   [provider]  gabapentin (NEURONTIN) 300 MG capsule Take 300 mg by mouth 3 (three) times daily. 12/18/20   [provider]  HYDROcodone-acetaminophen (NORCO/VICODIN) 5-325 MG tablet Take 1 tablet by mouth every 4 (four) hours as needed. Patient not taking: Reported on 12/29/2020 12/15/20   Alan Pence, MD  ibuprofen (ADVIL) 800 MG tablet Take 800 mg by mouth every 8 (eight) hours as needed.    [provider]  ipratropium-albuterol (DUONEB) 0.5-2.5 (3) MG/3ML SOLN Take 3 mLs by nebulization every 6 (six) hours as needed (for shortness of breath).    [provider]  lisinopril (ZESTRIL) 10 MG tablet Take 10 mg by mouth daily. 12/13/20   [provider]  nystatin (MYCOSTATIN/NYSTOP) powder Apply topically 4 (four) times daily. Until three days after rash improves Patient not taking: Reported on 12/29/2020 09/09/16   Mesner, [name], MD  oxyCODONE-acetaminophen (PERCOCET/ROXICET) 5-325 MG tablet Take 1 tablet by mouth every 6 (six) hours as needed for severe pain or moderate pain. 12/29/20   Alan Bo, MD  predniSONE (DELTASONE) 10 MG tablet Take q day 6,5,4,3,2,1 12/29/20   Alan Bo, MD  tamsulosin (FLOMAX) 0.4 MG CAPS capsule Take 0.4 mg by mouth daily. 12/13/20   [provider]    Physical Exam: BP (!) 129/55    Pulse 96    Temp 98.7 F (37.1 C) (Rectal)    Resp 13    SpO2 91%   General: Elderly male. Awake and alert and oriented x3. No acute cardiopulmonary distress.  HEENT: Normocephalic atraumatic.  Right and left ears normal in appearance.  Pupils equal, round, reactive to light. Extraocular muscles are intact.  Sclerae anicteric and noninjected.  Moist mucosal membranes. No mucosal lesions.  Neck: Neck supple without lymphadenopathy. No carotid bruits. No masses palpated.  Cardiovascular: Regular rate with normal S1-S2 sounds. No murmurs, rubs, gallops auscultated. No JVD.  Respiratory: Poor air movement.  Mild wheeze throughout..  No accessory muscle use. Abdomen: Soft, nontender, nondistended. Active bowel sounds. No masses or hepatosplenomegaly  Skin: No rashes, lesions, or ulcerations.  Dry, warm to touch. 2+ dorsalis pedis and radial pulses. Musculoskeletal: No calf or leg pain. All major joints not erythematous nontender.  No upper or lower joint deformation.  Good ROM.  No contractures  Psychiatric: Intact judgment and insight. Pleasant and cooperative. Neurologic: No focal neurological deficits. Strength is 5/5 and symmetric in upper and lower extremities.  Cranial nerves II through XII are grossly intact.  Labs on Admission: I have personally reviewed following labs and imaging studies  CBC: Recent Labs  Lab 01/12/21 1411  WBC 7.3  NEUTROABS 5.4  HGB 13.2  HCT 40.8  MCV 101.5*  PLT 353   Basic Metabolic Panel: Recent Labs  Lab 01/12/21 1411  NA 129*  K 4.2  CL 88*  CO2 29  GLUCOSE 85  BUN 22  CREATININE 2.06*  CALCIUM 9.4   GFR: CrCl cannot be calculated (Unknown ideal weight.). Liver Function Tests: Recent Labs  Lab 01/12/21 1411  AST 18  ALT 16  ALKPHOS 124  BILITOT 0.6  PROT 6.3*  ALBUMIN 3.4*   Recent Labs  Lab 01/12/21 1411  LIPASE 18   No results for input(s): AMMONIA in the last 168 hours. Coagulation Profile: No results for input(s): INR, PROTIME in the last 168 hours. Cardiac Enzymes: No results for input(s): CKTOTAL, CKMB, CKMBINDEX, TROPONINI in the last 168 hours. BNP (last 3 results) No results for input(s): PROBNP in the last 8760 hours. HbA1C: No results for input(s): HGBA1C in the last 72 hours. CBG: Recent Labs  Lab  01/12/21 1432  GLUCAP 78   Lipid Profile: No results for input(s): CHOL, HDL, LDLCALC, TRIG, CHOLHDL, LDLDIRECT in the last 72 hours. Thyroid Function Tests: No results for input(s): TSH, T4TOTAL, FREET4, T3FREE, THYROIDAB in the last 72 hours. Anemia Panel: No results for input(s): VITAMINB12, FOLATE, FERRITIN, TIBC, IRON, RETICCTPCT in the last 72 hours. Urine analysis:    Component Value Date/Time   COLORURINE YELLOW 01/12/2021 1616   APPEARANCEUR HAZY (A) 01/12/2021 1616   LABSPEC 1.015 01/12/2021 1616   PHURINE 5.5 01/12/2021 1616   GLUCOSEU NEGATIVE 01/12/2021 1616   HGBUR NEGATIVE 01/12/2021 [place] 01/12/2021 1616   KETONESUR NEGATIVE 01/12/2021 1616   PROTEINUR NEGATIVE 01/12/2021 1616   NITRITE NEGATIVE 01/12/2021 1616   LEUKOCYTESUR TRACE (A) 01/12/2021 1616   Sepsis Labs: @LABRCNTIP (procalcitonin:4,lacticidven:4) ) Recent Results (from the past 240 hour(s))  Resp Panel by RT-PCR (Flu A&B, Covid) Nasopharyngeal Swab     Status: None   Collection Time: 01/12/21  2:36 PM   Specimen: Nasopharyngeal Swab; Nasopharyngeal(NP) swabs in vial transport medium  Result Value Ref Range Status   SARS Coronavirus 2 by RT PCR NEGATIVE NEGATIVE Final    Comment: (NOTE) SARS-CoV-2 target nucleic acids are NOT DETECTED.  The SARS-CoV-2 RNA is generally detectable in upper respiratory specimens during the acute phase of infection. The lowest concentration of SARS-CoV-2 viral copies this assay can detect is 138 copies/mL. A negative result does not preclude SARS-Cov-2 infection and should not be used as the sole basis for treatment or other patient management decisions. A negative result may occur with  improper specimen collection/handling, submission of specimen other than nasopharyngeal swab, presence of viral mutation(s) within the areas targeted by this assay, and inadequate number of viral copies(<138 copies/mL). A negative result must be combined  with clinical observations, patient history, and epidemiological information. The expected result is Negative.  Fact Sheet for Patients:  EntrepreneurPulse.com.au  Fact Sheet for Healthcare Providers:  IncredibleEmployment.be  This test is no t yet approved or cleared by the [place] FDA and  has been authorized for detection and/or diagnosis of SARS-CoV-2 by FDA under an Emergency Use Authorization (EUA). This EUA will remain  in effect (meaning this test can be used) for the duration of the COVID-19 declaration under Section 564(b)(1) of the Act, 21 U.S.C.section 360bbb-3(b)(1), unless the authorization is terminated  or revoked sooner.  Influenza A by PCR NEGATIVE NEGATIVE Final   Influenza B by PCR NEGATIVE NEGATIVE Final    Comment: (NOTE) The Xpert Xpress SARS-CoV-2/FLU/RSV plus assay is intended as an aid in the diagnosis of influenza from Nasopharyngeal swab specimens and should not be used as a sole basis for treatment. Nasal washings and aspirates are unacceptable for Xpert Xpress SARS-CoV-2/FLU/RSV testing.  Fact Sheet for Patients: EntrepreneurPulse.com.au  Fact Sheet for Healthcare Providers: IncredibleEmployment.be  This test is not yet approved or cleared by the [place] FDA and has been authorized for detection and/or diagnosis of SARS-CoV-2 by FDA under an Emergency Use Authorization (EUA). This EUA will remain in effect (meaning this test can be used) for the duration of the COVID-19 declaration under Section 564(b)(1) of the Act, 21 U.S.C. section 360bbb-3(b)(1), unless the authorization is terminated or revoked.  Performed at [facility], [address], [place],  [address]      Radiological Exams on Admission: DG Chest 2 View  Result Date: 01/12/2021 CLINICAL DATA:  Edema with wheezing. EXAM: CHEST - 2 VIEW COMPARISON:  Chest x-ray dated December 29, 2020.  FINDINGS: Unchanged left chest wall port catheter. The heart size and mediastinal contours are within normal limits. Normal pulmonary vascularity. Low lung volumes with bibasilar linear atelectasis/scarring. Unchanged right upper lobe nodule, stable since at least 2018. No focal consolidation, pleural effusion, or pneumothorax. No acute osseous abnormality. IMPRESSION: 1. No active disease.  Bibasilar atelectasis/scarring. Electronically Signed   By: Titus Dubin M.D.   On: 01/12/2021 15:11   CT Head Wo Contrast  Result Date: 01/12/2021 CLINICAL DATA:  Mental status change, unknown cause EXAM: CT HEAD WITHOUT CONTRAST TECHNIQUE: Contiguous axial images were obtained from the base of the skull through the vertex without intravenous contrast. COMPARISON:  Head CT 01/02/2012 FINDINGS: Brain: No evidence of acute intracranial hemorrhage or extra-axial collection.No evidence of mass lesion/concern mass effect.The ventricles are normal in size. Vascular: No hyperdense vessel or unexpected calcification. Skull: Normal. Negative for fracture or focal lesion. Sinuses/Orbits: Mild ethmoid air cell mucosal thickening. Other: None. IMPRESSION: No acute intracranial abnormality. Electronically Signed   By: Maurine Simmering M.D.   On: 01/12/2021 14:38   CT Angio Chest PE W and/or Wo Contrast  Result Date: 01/12/2021 CLINICAL DATA:  Lethargy, edema, confusion, concern for PE EXAM: CT ANGIOGRAPHY CHEST WITH CONTRAST TECHNIQUE: Multidetector CT imaging of the chest was performed using the standard protocol during bolus administration of intravenous contrast. Multiplanar CT image reconstructions and MIPs were obtained to evaluate the vascular anatomy. CONTRAST:  66mL OMNIPAQUE IOHEXOL 350 MG/ML SOLN COMPARISON:  Same day chest radiograph, CT chest 11/09/2016 FINDINGS: Cardiovascular: There is adequate opacification of the pulmonary arteries to the segmental level. There is no evidence of acute pulmonary embolism. The heart is  not enlarged. There is no pericardial effusion. Coronary artery calcifications and calcified atherosclerotic plaque of the aortic arch are noted. Mediastinum/Nodes: The imaged thyroid is unremarkable. The esophagus is grossly unremarkable. There is no mediastinal, hilar, or axillary lymphadenopathy. Lungs/Pleura: The trachea and central airways are patent. There is a background of centrilobular emphysema. Linear opacities in the lung bases likely reflect atelectasis. There is no focal consolidation or pulmonary edema. There is no pleural effusion or pneumothorax. The 1.2 cm x 0.8 cm nodule in the right lung apex is unchanged since 2018 and decreased in size since 2016, likely benign. Upper Abdomen: Exophytic left renal cysts are noted. There are no acute findings in the imaged upper abdomen. Musculoskeletal: There  is no acute osseous abnormality or aggressive osseous lesion. Review of the MIP images confirms the above findings. IMPRESSION: 1. No evidence of pulmonary embolism to the segmental level. 2. No focal consolidation or pleural effusion. 3. Bibasilar subsegmental atelectasis. 4. Coronary artery calcifications, aortic Atherosclerosis (ICD10-I70.0), and Emphysema (ICD10-J43.9). Electronically Signed   By: Valetta Mole M.D.   On: 01/12/2021 16:06    EKG: Independently reviewed.  Sinus rhythm.  RSR prime in V1 and V2 possible RVH.  Slight ST elevation in aVF, although okay in leads II and III  Assessment/Plan: Principal Problem:   Acute respiratory failure with hypoxia (HCC) Active Problems:   COPD (chronic obstructive pulmonary disease) (HCC)   Cancer (HCC)   GERD (gastroesophageal reflux disease)   Chronic pain    This patient was discussed with the ED physician, including pertinent vitals, physical exam findings, labs, and imaging.  We also discussed care given by the ED provider.  Acute respiratory failure with hypoxia Likely COPD exacerbation.  No evidence of vascular congestion or  infection.  Improved somewhat on steroids and nebulizer treatments Continue steroids Continue LABA and ICS Continue S ABA No antibiotics Will check echocardiogram COPD Chronic pain Continue chronic pain regimen GERD Protonix History of colon cancer  DVT prophylaxis: Lovenox Consultants: None Code Status: Full code Family Communication: Daughter present during interview and exam Disposition Plan:    Truett Mainland, DO

## 2021-01-12 NOTE — ED Triage Notes (Signed)
Lethargic, oxygen sat 82% on arrival

## 2021-01-13 ENCOUNTER — Other Ambulatory Visit: Payer: Self-pay

## 2021-01-13 ENCOUNTER — Encounter (HOSPITAL_COMMUNITY): Payer: Self-pay | Admitting: Family Medicine

## 2021-01-13 ENCOUNTER — Inpatient Hospital Stay (HOSPITAL_COMMUNITY): Payer: Medicare Other

## 2021-01-13 DIAGNOSIS — R0602 Shortness of breath: Secondary | ICD-10-CM

## 2021-01-13 LAB — BASIC METABOLIC PANEL
Anion gap: 11 (ref 5–15)
BUN: 25 mg/dL — ABNORMAL HIGH (ref 8–23)
CO2: 29 mmol/L (ref 22–32)
Calcium: 9.3 mg/dL (ref 8.9–10.3)
Chloride: 91 mmol/L — ABNORMAL LOW (ref 98–111)
Creatinine, Ser: 1.53 mg/dL — ABNORMAL HIGH (ref 0.61–1.24)
GFR, Estimated: 50 mL/min — ABNORMAL LOW (ref >60)
Glucose, Bld: 136 mg/dL — ABNORMAL HIGH (ref 70–99)
Potassium: 4.5 mmol/L (ref 3.5–5.1)
Sodium: 131 mmol/L — ABNORMAL LOW (ref 135–145)

## 2021-01-13 LAB — CBC
HCT: 41.4 % (ref 39.0–52.0)
Hemoglobin: 13.6 g/dL (ref 13.0–17.0)
MCH: 33 pg (ref 26.0–34.0)
MCHC: 32.9 g/dL (ref 30.0–36.0)
MCV: 100.5 fL — ABNORMAL HIGH (ref 80.0–100.0)
Platelets: 206 10*3/uL (ref 150–400)
RBC: 4.12 MIL/uL — ABNORMAL LOW (ref 4.22–5.81)
RDW: 14.4 % (ref 11.5–15.5)
WBC: 6.1 10*3/uL (ref 4.0–10.5)
nRBC: 0 % (ref 0.0–0.2)

## 2021-01-13 LAB — ECHOCARDIOGRAM COMPLETE
Area-P 1/2: 4.15 cm2
Calc EF: 57.5 %
Height: 69 in
Single Plane A2C EF: 53.7 %
Single Plane A4C EF: 62.3 %
Weight: 4342.4 oz

## 2021-01-13 LAB — HIV ANTIBODY (ROUTINE TESTING W REFLEX): HIV Screen 4th Generation wRfx: NONREACTIVE

## 2021-01-13 MED ORDER — FOLIC ACID 1 MG PO TABS
1.0000 mg | ORAL_TABLET | Freq: Every day | ORAL | Status: DC
  Administered 2021-01-13 – 2021-01-15 (×3): 1 mg via ORAL
  Filled 2021-01-13 (×3): qty 1

## 2021-01-13 MED ORDER — GUAIFENESIN ER 600 MG PO TB12
600.0000 mg | ORAL_TABLET | Freq: Two times a day (BID) | ORAL | Status: DC
  Administered 2021-01-13 – 2021-01-15 (×3): 600 mg via ORAL
  Filled 2021-01-13 (×5): qty 1

## 2021-01-13 MED ORDER — METHOCARBAMOL 500 MG PO TABS
750.0000 mg | ORAL_TABLET | Freq: Three times a day (TID) | ORAL | Status: DC
  Administered 2021-01-13 – 2021-01-15 (×7): 750 mg via ORAL
  Filled 2021-01-13 (×7): qty 2

## 2021-01-13 MED ORDER — THIAMINE HCL 100 MG PO TABS
100.0000 mg | ORAL_TABLET | Freq: Every day | ORAL | Status: DC
  Administered 2021-01-13 – 2021-01-15 (×3): 100 mg via ORAL
  Filled 2021-01-13 (×3): qty 1

## 2021-01-13 MED ORDER — AZITHROMYCIN 250 MG PO TABS
500.0000 mg | ORAL_TABLET | Freq: Every day | ORAL | Status: DC
  Administered 2021-01-13 – 2021-01-15 (×3): 500 mg via ORAL
  Filled 2021-01-13 (×3): qty 2

## 2021-01-13 MED ORDER — POLYETHYLENE GLYCOL 3350 17 G PO PACK
17.0000 g | PACK | Freq: Every day | ORAL | Status: DC
  Filled 2021-01-13 (×3): qty 1

## 2021-01-13 MED ORDER — ADULT MULTIVITAMIN W/MINERALS CH
1.0000 | ORAL_TABLET | Freq: Every day | ORAL | Status: DC
  Administered 2021-01-13 – 2021-01-15 (×3): 1 via ORAL
  Filled 2021-01-13 (×3): qty 1

## 2021-01-13 MED ORDER — PERFLUTREN LIPID MICROSPHERE
1.0000 mL | INTRAVENOUS | Status: AC | PRN
Start: 2021-01-13 — End: 2021-01-13
  Administered 2021-01-13: 2 mL via INTRAVENOUS

## 2021-01-13 MED ORDER — NICOTINE 21 MG/24HR TD PT24
21.0000 mg | MEDICATED_PATCH | Freq: Every day | TRANSDERMAL | Status: DC
  Administered 2021-01-13 – 2021-01-15 (×3): 21 mg via TRANSDERMAL
  Filled 2021-01-13 (×3): qty 1

## 2021-01-13 MED ORDER — METHYLPREDNISOLONE SODIUM SUCC 40 MG IJ SOLR
40.0000 mg | Freq: Two times a day (BID) | INTRAMUSCULAR | Status: DC
  Administered 2021-01-13 – 2021-01-15 (×5): 40 mg via INTRAVENOUS
  Filled 2021-01-13 (×5): qty 1

## 2021-01-13 NOTE — Progress Notes (Signed)
PROGRESS NOTE     [name], is a 65 y.o. male, DOB - 04-Jul-1955, VPX:[id number]  Admit date - 01/12/2021   Admitting Physician Truett Mainland, DO  Outpatient Primary MD for the patient is Drue Second IV, FNP  LOS - 1  No chief complaint on file.       Brief Narrative:  65 y.o. male with a history of history of colon cancer status post surgery and chemotherapy, COPD, GERD, chronic pain currently smokes 4 packs of cigarettes a day admitted on 01/12/2021 with acute COPD exacerbation and hypoxia  Assessment & Plan:   Principal Problem:   Acute respiratory failure with hypoxia ([place]) Active Problems:   COPD (chronic obstructive pulmonary disease) (HCC)   Cancer (HCC)   GERD (gastroesophageal reflux disease)   Chronic pain   1)Acute COPD/Emphysema Exacerbation- CTA chest without definite pneumonia,  treat empirically with IV Solu-Medrol, mucolytics, azithromycin and bronchodilators as ordered, supplemental oxygen as ordered.  -Patient smokes up to 4 packs of cigarettes per day -Patient with significant cough, dyspnea on exertion, increased work of breathing with minimal activity, desaturation with activity, with O2 sats down to 84% on room air  2) coronary artery calcification/aortic atherosclerosis--denies chest pains or ACS type symptoms -Patient smokes up to 4 packs of cigarettes per day -Smoking cessation strongly advised -Echo from 01/13/2021 with EF of 60 to 46% without diastolic dysfunction  3) tobacco abuse--Patient smokes up to 4 packs of cigarettes per day -Smoking cessation strongly advised patient is not ready to quit  4) bilateral shoulder pain/chronic pain syndrome--- continue home pain medications, methocarbamol as ordered, -Patient advised MRI of the left shoulder, C-spine and L-spine planned for 01/19/2021  5)Tremors/Jerks--patient restarted when he was started on gabapentin -Discontinue gabapentin for now  6)Reformed alcoholic--- with  neuropathy type symptoms, see 5 above -Denies current EtOH use -Check serum folate, B12, -Empiric thiamine folic acid and multivitamin  7)BPH--continue Flomax  Disposition/Need for in-Hospital Stay- patient unable to be discharged at this time due to --acute COPD exacerbation with hypoxia requiring IV steroids and bronchodilators and supplemental oxygen -Possible discharge home in 1 to 2 days if improves as anticipated  Status is: Inpatient  Remains inpatient appropriate because: Please see disposition above  Disposition: The patient is from: Home              Anticipated d/c is to: Home              Anticipated d/c date is: 2 days              Patient currently is not medically stable to d/c. Barriers: Not Clinically Stable-   Code Status :  -  Code Status: Full Code   Family Communication:    NA (patient is alert, awake and coherent)   Consults  :  na  DVT Prophylaxis  :   - SCDs  enoxaparin (LOVENOX) injection 40 mg Start: 01/12/21 2100    Lab Results  Component Value Date   PLT 206 01/13/2021    Inpatient Medications  Scheduled Meds:  atorvastatin  20 mg Oral QPM   bumetanide  1 mg Oral Daily   enoxaparin (LOVENOX) injection  40 mg Subcutaneous E70J   folic acid  1 mg Oral Daily   guaiFENesin  600 mg Oral BID   ipratropium-albuterol  3 mL Nebulization Q6H   methocarbamol  750 mg Oral TID   methylPREDNISolone (SOLU-MEDROL) injection  40 mg Intravenous Q12H   multivitamin with minerals  1 tablet Oral Daily   nicotine  21 mg Transdermal Daily   polyethylene glycol  17 g Oral Daily   tamsulosin  0.4 mg Oral Daily   thiamine  100 mg Oral Daily   Continuous Infusions:  sodium chloride 10 mL/hr at 01/12/21 2254   PRN Meds:.sodium chloride, albuterol, ondansetron **OR** ondansetron (ZOFRAN) IV, oxyCODONE-acetaminophen   Anti-infectives (From admission, onward)    None         Subjective: [name] today has no fevers, no emesis,  No chest pain,    - -Patient with significant cough, dyspnea on exertion, increased work of breathing with minimal activity, desaturation with activity, with O2 sats down to 84% on room air  Objective: Vitals:   01/13/21 0900 01/13/21 0918 01/13/21 1314 01/13/21 1421  BP:  132/83 110/67   Pulse:  92 83   Resp:  18 19   Temp:  98.3 F (36.8 C) 98.2 F (36.8 C)   TempSrc:  Oral    SpO2: (!) 89% 91% 93% 92%  Weight:      Height:        Intake/Output Summary (Last 24 hours) at 01/13/2021 1836 Last data filed at 01/13/2021 1200 Gross per 24 hour  Intake 759.89 ml  Output --  Net 759.89 ml   Filed Weights   01/12/21 2107  Weight: 123.1 kg    Physical Exam  Gen:- Awake Alert,  in no apparent distress , dyspnea on exertion HEENT:- [place].AT, No sclera icterus Nose- [place] 2L/mins Neck-Supple Neck,No JVD,.  Lungs-  diminished breath sounds , scattered wheezes bilaterally CV- S1, S2 normal, regular  Abd-  +ve B.Sounds, Abd Soft, No tenderness, increased truncal adiposity Extremity/Skin:- trace  edema, pedal pulses present  Psych-affect is appropriate, oriented x3 Neuro-no new focal deficits, jerking movements,mild tremors  Data Reviewed: I have personally reviewed following labs and imaging studies  CBC: Recent Labs  Lab 01/12/21 1411 01/13/21 0446  WBC 7.3 6.1  NEUTROABS 5.4  --   HGB 13.2 13.6  HCT 40.8 41.4  MCV 101.5* 100.5*  PLT 188 892   Basic Metabolic Panel: Recent Labs  Lab 01/12/21 1411 01/13/21 0446  NA 129* 131*  K 4.2 4.5  CL 88* 91*  CO2 29 29  GLUCOSE 85 136*  BUN 22 25*  CREATININE 2.06* 1.53*  CALCIUM 9.4 9.3   GFR: Estimated Creatinine Clearance: 61.6 mL/min (A) (by C-G formula based on SCr of 1.53 mg/dL (H)). Liver Function Tests: Recent Labs  Lab 01/12/21 1411  AST 18  ALT 16  ALKPHOS 124  BILITOT 0.6  PROT 6.3*  ALBUMIN 3.4*   Recent Labs  Lab 01/12/21 1411  LIPASE 18   No results for input(s): AMMONIA in the last 168 hours. Coagulation  Profile: No results for input(s): INR, PROTIME in the last 168 hours. Cardiac Enzymes: No results for input(s): CKTOTAL, CKMB, CKMBINDEX, TROPONINI in the last 168 hours. BNP (last 3 results) No results for input(s): PROBNP in the last 8760 hours. HbA1C: No results for input(s): HGBA1C in the last 72 hours. CBG: Recent Labs  Lab 01/12/21 1432  GLUCAP 78   Lipid Profile: No results for input(s): CHOL, HDL, LDLCALC, TRIG, CHOLHDL, LDLDIRECT in the last 72 hours. Thyroid Function Tests: No results for input(s): TSH, T4TOTAL, FREET4, T3FREE, THYROIDAB in the last 72 hours. Anemia Panel: No results for input(s): VITAMINB12, FOLATE, FERRITIN, TIBC, IRON, RETICCTPCT in the last 72 hours. Urine analysis:    Component Value Date/Time   COLORURINE  YELLOW 01/12/2021 1616   APPEARANCEUR HAZY (A) 01/12/2021 1616   LABSPEC 1.015 01/12/2021 1616   PHURINE 5.5 01/12/2021 1616   GLUCOSEU NEGATIVE 01/12/2021 1616   HGBUR NEGATIVE 01/12/2021 [place] 01/12/2021 1616   KETONESUR NEGATIVE 01/12/2021 1616   PROTEINUR NEGATIVE 01/12/2021 1616   NITRITE NEGATIVE 01/12/2021 1616   LEUKOCYTESUR TRACE (A) 01/12/2021 1616   Sepsis Labs: @LABRCNTIP (procalcitonin:4,lacticidven:4)  ) Recent Results (from the past 240 hour(s))  Resp Panel by RT-PCR (Flu A&B, Covid) Nasopharyngeal Swab     Status: None   Collection Time: 01/12/21  2:36 PM   Specimen: Nasopharyngeal Swab; Nasopharyngeal(NP) swabs in vial transport medium  Result Value Ref Range Status   SARS Coronavirus 2 by RT PCR NEGATIVE NEGATIVE Final    Comment: (NOTE) SARS-CoV-2 target nucleic acids are NOT DETECTED.  The SARS-CoV-2 RNA is generally detectable in upper respiratory specimens during the acute phase of infection. The lowest concentration of SARS-CoV-2 viral copies this assay can detect is 138 copies/mL. A negative result does not preclude SARS-Cov-2 infection and should not be used as the sole basis for  treatment or other patient management decisions. A negative result may occur with  improper specimen collection/handling, submission of specimen other than nasopharyngeal swab, presence of viral mutation(s) within the areas targeted by this assay, and inadequate number of viral copies(<138 copies/mL). A negative result must be combined with clinical observations, patient history, and epidemiological information. The expected result is Negative.  Fact Sheet for Patients:  EntrepreneurPulse.com.au  Fact Sheet for Healthcare Providers:  IncredibleEmployment.be  This test is no t yet approved or cleared by the [place] FDA and  has been authorized for detection and/or diagnosis of SARS-CoV-2 by FDA under an Emergency Use Authorization (EUA). This EUA will remain  in effect (meaning this test can be used) for the duration of the COVID-19 declaration under Section 564(b)(1) of the Act, 21 U.S.C.section 360bbb-3(b)(1), unless the authorization is terminated  or revoked sooner.       Influenza A by PCR NEGATIVE NEGATIVE Final   Influenza B by PCR NEGATIVE NEGATIVE Final    Comment: (NOTE) The Xpert Xpress SARS-CoV-2/FLU/RSV plus assay is intended as an aid in the diagnosis of influenza from Nasopharyngeal swab specimens and should not be used as a sole basis for treatment. Nasal washings and aspirates are unacceptable for Xpert Xpress SARS-CoV-2/FLU/RSV testing.  Fact Sheet for Patients: EntrepreneurPulse.com.au  Fact Sheet for Healthcare Providers: IncredibleEmployment.be  This test is not yet approved or cleared by the [place] FDA and has been authorized for detection and/or diagnosis of SARS-CoV-2 by FDA under an Emergency Use Authorization (EUA). This EUA will remain in effect (meaning this test can be used) for the duration of the COVID-19 declaration under Section 564(b)(1) of the Act, 21  U.S.C. section 360bbb-3(b)(1), unless the authorization is terminated or revoked.  Performed at [facility], [address] [place], [place] [address]       Radiology Studies: DG Chest 2 View  Result Date: 01/12/2021 CLINICAL DATA:  Edema with wheezing. EXAM: CHEST - 2 VIEW COMPARISON:  Chest x-ray dated December 29, 2020. FINDINGS: Unchanged left chest wall port catheter. The heart size and mediastinal contours are within normal limits. Normal pulmonary vascularity. Low lung volumes with bibasilar linear atelectasis/scarring. Unchanged right upper lobe nodule, stable since at least 2018. No focal consolidation, pleural effusion, or pneumothorax. No acute osseous abnormality. IMPRESSION: 1. No active disease.  Bibasilar atelectasis/scarring. Electronically Signed   By: Gwyndolyn Saxon  Marzella Schlein M.D.   On: 01/12/2021 15:11   CT Head Wo Contrast  Result Date: 01/12/2021 CLINICAL DATA:  Mental status change, unknown cause EXAM: CT HEAD WITHOUT CONTRAST TECHNIQUE: Contiguous axial images were obtained from the base of the skull through the vertex without intravenous contrast. COMPARISON:  Head CT 01/02/2012 FINDINGS: Brain: No evidence of acute intracranial hemorrhage or extra-axial collection.No evidence of mass lesion/concern mass effect.The ventricles are normal in size. Vascular: No hyperdense vessel or unexpected calcification. Skull: Normal. Negative for fracture or focal lesion. Sinuses/Orbits: Mild ethmoid air cell mucosal thickening. Other: None. IMPRESSION: No acute intracranial abnormality. Electronically Signed   By: Maurine Simmering M.D.   On: 01/12/2021 14:38   CT Angio Chest PE W and/or Wo Contrast  Result Date: 01/12/2021 CLINICAL DATA:  Lethargy, edema, confusion, concern for PE EXAM: CT ANGIOGRAPHY CHEST WITH CONTRAST TECHNIQUE: Multidetector CT imaging of the chest was performed using the standard protocol during bolus administration of intravenous contrast. Multiplanar CT image  reconstructions and MIPs were obtained to evaluate the vascular anatomy. CONTRAST:  51mL OMNIPAQUE IOHEXOL 350 MG/ML SOLN COMPARISON:  Same day chest radiograph, CT chest 11/09/2016 FINDINGS: Cardiovascular: There is adequate opacification of the pulmonary arteries to the segmental level. There is no evidence of acute pulmonary embolism. The heart is not enlarged. There is no pericardial effusion. Coronary artery calcifications and calcified atherosclerotic plaque of the aortic arch are noted. Mediastinum/Nodes: The imaged thyroid is unremarkable. The esophagus is grossly unremarkable. There is no mediastinal, hilar, or axillary lymphadenopathy. Lungs/Pleura: The trachea and central airways are patent. There is a background of centrilobular emphysema. Linear opacities in the lung bases likely reflect atelectasis. There is no focal consolidation or pulmonary edema. There is no pleural effusion or pneumothorax. The 1.2 cm x 0.8 cm nodule in the right lung apex is unchanged since 2018 and decreased in size since 2016, likely benign. Upper Abdomen: Exophytic left renal cysts are noted. There are no acute findings in the imaged upper abdomen. Musculoskeletal: There is no acute osseous abnormality or aggressive osseous lesion. Review of the MIP images confirms the above findings. IMPRESSION: 1. No evidence of pulmonary embolism to the segmental level. 2. No focal consolidation or pleural effusion. 3. Bibasilar subsegmental atelectasis. 4. Coronary artery calcifications, aortic Atherosclerosis (ICD10-I70.0), and Emphysema (ICD10-J43.9). Electronically Signed   By: Valetta Mole M.D.   On: 01/12/2021 16:06   ECHOCARDIOGRAM COMPLETE  Result Date: 01/13/2021    ECHOCARDIOGRAM REPORT   Patient Name:   [name] Date of Exam: 01/13/2021 Medical Rec #:  [id number]         Height:       69.0 in Accession #:    [id number]        Weight:       271.4 lb Date of Birth:  1955/07/15        BSA:          2.352 m Patient Age:     61 years          BP:           132/83 mmHg Patient Gender: M                 HR:           90 bpm. Exam Location:  [facility] Procedure: 2D Echo, Intracardiac Opacification Agent, Cardiac Doppler and Color            Doppler Indications:    R06.02 SOB  History:  Patient has no prior history of Echocardiogram examinations.                 COPD; Signs/Symptoms:Shortness of Breath, Dyspnea and Altered                 Mental Status. Cancer. Chemo. Chronic narcotic use.  Sonographer:    Roseanna Rainbow RDCS Referring Phys: [phone] JACOB J STINSON  Sonographer Comments: Technically difficult study due to poor echo windows, no parasternal window, suboptimal apical window, no subcostal window and patient is morbidly obese. Image acquisition challenging due to patient body habitus, Image acquisition challenging due to uncooperative patient and Image acquisition challenging due to COPD. Patient could not follow directions. Supine. IMPRESSIONS  1. Technically difficult echo. Limited views- no parasternal views.  2. Left ventricular ejection fraction, by estimation, is 60 to 65%. The left ventricle has normal function. The left ventricle has no regional wall motion abnormalities. Left ventricular diastolic parameters were normal.  3. Right ventricular systolic function is normal. The right ventricular size is normal.  4. The mitral valve was not well visualized. No evidence of mitral valve regurgitation.  5. The aortic valve was not well visualized. Aortic valve regurgitation is not visualized. FINDINGS  Left Ventricle: Left ventricular ejection fraction, by estimation, is 60 to 65%. The left ventricle has normal function. The left ventricle has no regional wall motion abnormalities. Definity contrast agent was given IV to delineate the left ventricular  endocardial borders. The left ventricular internal cavity size was normal in size. Suboptimal image quality limits for assessment of left ventricular hypertrophy. Left  ventricular diastolic parameters were normal. Right Ventricle: The right ventricular size is normal. Right vetricular wall thickness was not well visualized. Right ventricular systolic function is normal. Left Atrium: Left atrial size was normal in size. Right Atrium: Right atrial size was normal in size. Pericardium: There is no evidence of pericardial effusion. Mitral Valve: The mitral valve was not well visualized. No evidence of mitral valve regurgitation. Tricuspid Valve: The tricuspid valve is not well visualized. Tricuspid valve regurgitation is not demonstrated. Aortic Valve: The aortic valve was not well visualized. Aortic valve regurgitation is not visualized. Pulmonic Valve: The pulmonic valve was not well visualized. Pulmonic valve regurgitation is not visualized. Aorta: The aortic root was not well visualized. IAS/Shunts: The atrial septum is grossly normal.   LV Volumes (MOD) LV vol d, MOD A2C: 101.0 ml Diastology LV vol d, MOD A4C: 98.6 ml  LV e' medial:    7.18 cm/s LV vol s, MOD A2C: 46.8 ml  LV E/e' medial:  9.4 LV vol s, MOD A4C: 37.2 ml  LV e' lateral:   8.52 cm/s LV SV MOD A2C:     54.2 ml  LV E/e' lateral: 7.9 LV SV MOD A4C:     98.6 ml LV SV MOD BP:      57.4 ml RIGHT VENTRICLE             IVC RV S prime:     13.65 cm/s  IVC diam: 2.10 cm TAPSE (M-mode): 2.1 cm LEFT ATRIUM             Index        RIGHT ATRIUM           Index LA Vol (A2C):   43.5 ml 18.49 ml/m  RA Area:     14.30 cm LA Vol (A4C):   27.5 ml 11.69 ml/m  RA Volume:   29.00 ml  12.33 ml/m  LA Biplane Vol: 35.9 ml 15.26 ml/m  AORTIC VALVE LVOT Vmax:   124.00 cm/s LVOT Vmean:  78.200 cm/s LVOT VTI:    0.194 m MITRAL VALVE MV Area (PHT): 4.15 cm    SHUNTS MV Decel Time: 183 msec    Systemic VTI: 0.19 m MV E velocity: 67.30 cm/s MV A velocity: 88.70 cm/s MV E/A ratio:  0.76 Mertie Moores MD Electronically signed by Mertie Moores MD Signature Date/Time: 01/13/2021/1:55:38 PM    Final      Scheduled Meds:  atorvastatin  20 mg  Oral QPM   bumetanide  1 mg Oral Daily   enoxaparin (LOVENOX) injection  40 mg Subcutaneous G53M   folic acid  1 mg Oral Daily   guaiFENesin  600 mg Oral BID   ipratropium-albuterol  3 mL Nebulization Q6H   methocarbamol  750 mg Oral TID   methylPREDNISolone (SOLU-MEDROL) injection  40 mg Intravenous Q12H   multivitamin with minerals  1 tablet Oral Daily   nicotine  21 mg Transdermal Daily   polyethylene glycol  17 g Oral Daily   tamsulosin  0.4 mg Oral Daily   thiamine  100 mg Oral Daily   Continuous Infusions:  sodium chloride 10 mL/hr at 01/12/21 2254     LOS: 1 day    Roxan Hockey M.D on 01/13/2021 at 6:36 PM  Go to [email or web address] - for contact info  [facility] - Office  [phone]  If 7PM-7AM, please contact night-coverage [email or web address] Password [facility] 01/13/2021, 6:36 PM

## 2021-01-13 NOTE — Progress Notes (Signed)
°  Echocardiogram 2D Echocardiogram has been performed.  Bobbye Charleston 01/13/2021, 11:53 AM

## 2021-01-14 LAB — VITAMIN B12: Vitamin B-12: 484 pg/mL (ref 180–914)

## 2021-01-14 LAB — FOLATE: Folate: 6.7 ng/mL (ref >5.9)

## 2021-01-14 MED ORDER — FUROSEMIDE 10 MG/ML IJ SOLN
40.0000 mg | Freq: Once | INTRAMUSCULAR | Status: AC
  Administered 2021-01-14: 20:00:00 40 mg via INTRAVENOUS
  Filled 2021-01-14: qty 4

## 2021-01-14 MED ORDER — ASPIRIN EC 81 MG PO TBEC
81.0000 mg | DELAYED_RELEASE_TABLET | Freq: Every day | ORAL | Status: DC
  Administered 2021-01-15: 10:00:00 81 mg via ORAL
  Filled 2021-01-14: qty 1

## 2021-01-14 NOTE — Progress Notes (Addendum)
PROGRESS NOTE     [name], is a 65 y.o. male, DOB - 1955/06/26, NIO:[id number]  Admit date - 01/12/2021   Admitting Physician Truett Mainland, DO  Outpatient Primary MD for the patient is Drue Second IV, FNP  LOS - 2  No chief complaint on file.       Brief Narrative:  65 y.o. male with a history of history of colon cancer status post surgery and chemotherapy, COPD, GERD, chronic pain currently smokes 4 packs of cigarettes a day admitted on 01/12/2021 with acute COPD exacerbation and hypoxia  Assessment & Plan:   Principal Problem:   Acute respiratory failure with hypoxia ([place]) Active Problems:   COPD (chronic obstructive pulmonary disease) (HCC)   Cancer (HCC)   GERD (gastroesophageal reflux disease)   Chronic pain   1)Acute COPD/Emphysema Exacerbation- CTA chest without definite pneumonia,  treat empirically with IV Solu-Medrol, mucolytics, azithromycin and bronchodilators as ordered, supplemental oxygen as ordered.  -Patient smokes up to 4 packs of cigarettes per day -Patient with significant cough, dyspnea on exertion,  desaturation with activity, with O2 sats down to 84% on room air -Work of breathing with activity improving slightly, currently on 3 L of oxygen via nasal cannula  2)Coronary artery calcification/Aortic Atherosclerosis--denies chest pains or ACS type symptoms -Patient smokes up to 4 packs of cigarettes per day -Smoking cessation strongly advised -Echo from 01/13/2021 with EF of 60 to 81% without diastolic dysfunction -Give aspirin and Lipitor -Check fasting lipid profile  3)Tobacco Abuse--Patient smokes up to 4 packs of cigarettes per day -Smoking cessation strongly advised patient is not ready to quit  4)Bilateral shoulder pain/chronic pain syndrome--- continue home pain medications, methocarbamol as ordered, -Patient has MRI of the left shoulder, C-spine and L-spine planned for 01/19/2021  5)Tremors/Jerks--patient restarted  when he was started on gabapentin -Improved after Discontinuation of  gabapentin   6)Reformed alcoholic--- with neuropathy type symptoms, see 5 above -Denies current EtOH use -Serum folate, B12---are Not low -Empiric thiamine folic acid and multivitamin  7)BPH--continue Flomax  Disposition/Need for in-Hospital Stay- patient unable to be discharged at this time due to --acute COPD exacerbation with hypoxia requiring IV steroids and bronchodilators and supplemental oxygen -Possible discharge home in 1 to 2 days if improves as anticipated  Status is: Inpatient  Remains inpatient appropriate because: Please see disposition above  Disposition: The patient is from: Home              Anticipated d/c is to: Home              Anticipated d/c date is: 2 days              Patient currently is not medically stable to d/c. Barriers: Not Clinically Stable-   Code Status :  -  Code Status: Full Code   Family Communication:    NA (patient is alert, awake and coherent)   Consults  :  na  DVT Prophylaxis  :   - SCDs  enoxaparin (LOVENOX) injection 40 mg Start: 01/12/21 2100   Lab Results  Component Value Date   PLT 206 01/13/2021    Inpatient Medications  Scheduled Meds:  atorvastatin  20 mg Oral QPM   azithromycin  500 mg Oral Daily   bumetanide  1 mg Oral Daily   enoxaparin (LOVENOX) injection  40 mg Subcutaneous W29H   folic acid  1 mg Oral Daily   furosemide  40 mg Intravenous Once   guaiFENesin  600 mg  Oral BID   ipratropium-albuterol  3 mL Nebulization Q6H   methocarbamol  750 mg Oral TID   methylPREDNISolone (SOLU-MEDROL) injection  40 mg Intravenous Q12H   multivitamin with minerals  1 tablet Oral Daily   nicotine  21 mg Transdermal Daily   polyethylene glycol  17 g Oral Daily   tamsulosin  0.4 mg Oral Daily   thiamine  100 mg Oral Daily   Continuous Infusions:  sodium chloride 10 mL/hr at 01/12/21 2254   PRN Meds:.sodium chloride, albuterol, ondansetron **OR**  ondansetron (ZOFRAN) IV, oxyCODONE-acetaminophen   Anti-infectives (From admission, onward)    Start     Dose/Rate Route Frequency Ordered Stop   01/13/21 1930  azithromycin (ZITHROMAX) tablet 500 mg        500 mg Oral Daily 01/13/21 1843          Subjective: [name] today has no fevers, no emesis,  No chest pain,   - -Patient with significant cough, dyspnea on exertion,  desaturation with activity, with O2 sats down to 84% on room air -Work of breathing with activity improving slightly, currently on 3 L of oxygen via nasal cannula  Objective: Vitals:   01/13/21 2116 01/14/21 0517 01/14/21 1328 01/14/21 1520  BP: 117/74 99/61 (!) 121/56   Pulse: 83 71 91   Resp: 20 20 18    Temp: 98.3 F (36.8 C) 97.7 F (36.5 C) 98.9 F (37.2 C)   TempSrc:  Oral Oral   SpO2: 94% 94% 93% 94%  Weight:      Height:        Intake/Output Summary (Last 24 hours) at 01/14/2021 1853 Last data filed at 01/14/2021 1721 Gross per 24 hour  Intake 1300 ml  Output --  Net 1300 ml   Filed Weights   01/12/21 2107  Weight: 123.1 kg   Physical Exam  Gen:- Awake Alert,  in no apparent distress , dyspnea on exertion HEENT:- [place].AT, No sclera icterus Nose- [place] 3L/mins Neck-Supple Neck,No JVD,.  Lungs-  diminished breath sounds , scattered wheezes bilaterally CV- S1, S2 normal, regular , left-sided Port-A-Cath in situ Abd-  +ve B.Sounds, Abd Soft, No tenderness, increased truncal adiposity Extremity/Skin:- 3 +ve pitting edema, pedal pulses present  Psych-affect is appropriate, oriented x3 Neuro-no new focal deficits, jerking movements,mild tremors  Data Reviewed: I have personally reviewed following labs and imaging studies  CBC: Recent Labs  Lab 01/12/21 1411 01/13/21 0446  WBC 7.3 6.1  NEUTROABS 5.4  --   HGB 13.2 13.6  HCT 40.8 41.4  MCV 101.5* 100.5*  PLT 188 700   Basic Metabolic Panel: Recent Labs  Lab 01/12/21 1411 01/13/21 0446  NA 129* 131*  K 4.2 4.5  CL 88*  91*  CO2 29 29  GLUCOSE 85 136*  BUN 22 25*  CREATININE 2.06* 1.53*  CALCIUM 9.4 9.3   GFR: Estimated Creatinine Clearance: 61.6 mL/min (A) (by C-G formula based on SCr of 1.53 mg/dL (H)). Liver Function Tests: Recent Labs  Lab 01/12/21 1411  AST 18  ALT 16  ALKPHOS 124  BILITOT 0.6  PROT 6.3*  ALBUMIN 3.4*   Recent Labs  Lab 01/12/21 1411  LIPASE 18   No results for input(s): AMMONIA in the last 168 hours. Coagulation Profile: No results for input(s): INR, PROTIME in the last 168 hours. Cardiac Enzymes: No results for input(s): CKTOTAL, CKMB, CKMBINDEX, TROPONINI in the last 168 hours. BNP (last 3 results) No results for input(s): PROBNP in the last 8760 hours. HbA1C:  No results for input(s): HGBA1C in the last 72 hours. CBG: Recent Labs  Lab 01/12/21 1432  GLUCAP 78   Lipid Profile: No results for input(s): CHOL, HDL, LDLCALC, TRIG, CHOLHDL, LDLDIRECT in the last 72 hours. Thyroid Function Tests: No results for input(s): TSH, T4TOTAL, FREET4, T3FREE, THYROIDAB in the last 72 hours. Anemia Panel: Recent Labs    01/14/21 0430  VITAMINB12 484  FOLATE 6.7   Urine analysis:    Component Value Date/Time   COLORURINE YELLOW 01/12/2021 1616   APPEARANCEUR HAZY (A) 01/12/2021 1616   LABSPEC 1.015 01/12/2021 1616   PHURINE 5.5 01/12/2021 1616   GLUCOSEU NEGATIVE 01/12/2021 1616   HGBUR NEGATIVE 01/12/2021 1616   BILIRUBINUR NEGATIVE 01/12/2021 1616   KETONESUR NEGATIVE 01/12/2021 1616   PROTEINUR NEGATIVE 01/12/2021 1616   NITRITE NEGATIVE 01/12/2021 1616   LEUKOCYTESUR TRACE (A) 01/12/2021 1616   Sepsis Labs: @LABRCNTIP (procalcitonin:4,lacticidven:4)  ) Recent Results (from the past 240 hour(s))  Resp Panel by RT-PCR (Flu A&B, Covid) Nasopharyngeal Swab     Status: None   Collection Time: 01/12/21  2:36 PM   Specimen: Nasopharyngeal Swab; Nasopharyngeal(NP) swabs in vial transport medium  Result Value Ref Range Status   SARS Coronavirus 2 by RT PCR  NEGATIVE NEGATIVE Final    Comment: (NOTE) SARS-CoV-2 target nucleic acids are NOT DETECTED.  The SARS-CoV-2 RNA is generally detectable in upper respiratory specimens during the acute phase of infection. The lowest concentration of SARS-CoV-2 viral copies this assay can detect is 138 copies/mL. A negative result does not preclude SARS-Cov-2 infection and should not be used as the sole basis for treatment or other patient management decisions. A negative result may occur with  improper specimen collection/handling, submission of specimen other than nasopharyngeal swab, presence of viral mutation(s) within the areas targeted by this assay, and inadequate number of viral copies(<138 copies/mL). A negative result must be combined with clinical observations, patient history, and epidemiological information. The expected result is Negative.  Fact Sheet for Patients:  EntrepreneurPulse.com.au  Fact Sheet for Healthcare Providers:  IncredibleEmployment.be  This test is no t yet approved or cleared by the [place] FDA and  has been authorized for detection and/or diagnosis of SARS-CoV-2 by FDA under an Emergency Use Authorization (EUA). This EUA will remain  in effect (meaning this test can be used) for the duration of the COVID-19 declaration under Section 564(b)(1) of the Act, 21 U.S.C.section 360bbb-3(b)(1), unless the authorization is terminated  or revoked sooner.       Influenza A by PCR NEGATIVE NEGATIVE Final   Influenza B by PCR NEGATIVE NEGATIVE Final    Comment: (NOTE) The Xpert Xpress SARS-CoV-2/FLU/RSV plus assay is intended as an aid in the diagnosis of influenza from Nasopharyngeal swab specimens and should not be used as a sole basis for treatment. Nasal washings and aspirates are unacceptable for Xpert Xpress SARS-CoV-2/FLU/RSV testing.  Fact Sheet for Patients: EntrepreneurPulse.com.au  Fact Sheet for  Healthcare Providers: IncredibleEmployment.be  This test is not yet approved or cleared by the [place] FDA and has been authorized for detection and/or diagnosis of SARS-CoV-2 by FDA under an Emergency Use Authorization (EUA). This EUA will remain in effect (meaning this test can be used) for the duration of the COVID-19 declaration under Section 564(b)(1) of the Act, 21 U.S.C. section 360bbb-3(b)(1), unless the authorization is terminated or revoked.  Performed at [facility], [address], [place], [place] [address]     Radiology Studies: ECHOCARDIOGRAM COMPLETE  Result Date: 01/13/2021  ECHOCARDIOGRAM REPORT   Patient Name:   MANAN OLMO Date of Exam: 01/13/2021 Medical Rec #:  [id number]         Height:       69.0 in Accession #:    [id number]        Weight:       271.4 lb Date of Birth:  14-May-1955        BSA:          2.352 m Patient Age:    36 years          BP:           132/83 mmHg Patient Gender: M                 HR:           90 bpm. Exam Location:  [facility] Procedure: 2D Echo, Intracardiac Opacification Agent, Cardiac Doppler and Color            Doppler Indications:    R06.02 SOB  History:        Patient has no prior history of Echocardiogram examinations.                 COPD; Signs/Symptoms:Shortness of Breath, Dyspnea and Altered                 Mental Status. Cancer. Chemo. Chronic narcotic use.  Sonographer:    Roseanna Rainbow RDCS Referring Phys: [phone] JACOB J STINSON  Sonographer Comments: Technically difficult study due to poor echo windows, no parasternal window, suboptimal apical window, no subcostal window and patient is morbidly obese. Image acquisition challenging due to patient body habitus, Image acquisition challenging due to uncooperative patient and Image acquisition challenging due to COPD. Patient could not follow directions. Supine. IMPRESSIONS  1. Technically difficult echo. Limited views- no parasternal views.  2. Left ventricular  ejection fraction, by estimation, is 60 to 65%. The left ventricle has normal function. The left ventricle has no regional wall motion abnormalities. Left ventricular diastolic parameters were normal.  3. Right ventricular systolic function is normal. The right ventricular size is normal.  4. The mitral valve was not well visualized. No evidence of mitral valve regurgitation.  5. The aortic valve was not well visualized. Aortic valve regurgitation is not visualized. FINDINGS  Left Ventricle: Left ventricular ejection fraction, by estimation, is 60 to 65%. The left ventricle has normal function. The left ventricle has no regional wall motion abnormalities. Definity contrast agent was given IV to delineate the left ventricular  endocardial borders. The left ventricular internal cavity size was normal in size. Suboptimal image quality limits for assessment of left ventricular hypertrophy. Left ventricular diastolic parameters were normal. Right Ventricle: The right ventricular size is normal. Right vetricular wall thickness was not well visualized. Right ventricular systolic function is normal. Left Atrium: Left atrial size was normal in size. Right Atrium: Right atrial size was normal in size. Pericardium: There is no evidence of pericardial effusion. Mitral Valve: The mitral valve was not well visualized. No evidence of mitral valve regurgitation. Tricuspid Valve: The tricuspid valve is not well visualized. Tricuspid valve regurgitation is not demonstrated. Aortic Valve: The aortic valve was not well visualized. Aortic valve regurgitation is not visualized. Pulmonic Valve: The pulmonic valve was not well visualized. Pulmonic valve regurgitation is not visualized. Aorta: The aortic root was not well visualized. IAS/Shunts: The atrial septum is grossly normal.   LV Volumes (MOD) LV vol d, MOD A2C: 101.0 ml  Diastology LV vol d, MOD A4C: 98.6 ml  LV e' medial:    7.18 cm/s LV vol s, MOD A2C: 46.8 ml  LV E/e' medial:  9.4  LV vol s, MOD A4C: 37.2 ml  LV e' lateral:   8.52 cm/s LV SV MOD A2C:     54.2 ml  LV E/e' lateral: 7.9 LV SV MOD A4C:     98.6 ml LV SV MOD BP:      57.4 ml RIGHT VENTRICLE             IVC RV S prime:     13.65 cm/s  IVC diam: 2.10 cm TAPSE (M-mode): 2.1 cm LEFT ATRIUM             Index        RIGHT ATRIUM           Index LA Vol (A2C):   43.5 ml 18.49 ml/m  RA Area:     14.30 cm LA Vol (A4C):   27.5 ml 11.69 ml/m  RA Volume:   29.00 ml  12.33 ml/m LA Biplane Vol: 35.9 ml 15.26 ml/m  AORTIC VALVE LVOT Vmax:   124.00 cm/s LVOT Vmean:  78.200 cm/s LVOT VTI:    0.194 m MITRAL VALVE MV Area (PHT): 4.15 cm    SHUNTS MV Decel Time: 183 msec    Systemic VTI: 0.19 m MV E velocity: 67.30 cm/s MV A velocity: 88.70 cm/s MV E/A ratio:  0.76 Mertie Moores MD Electronically signed by Mertie Moores MD Signature Date/Time: 01/13/2021/1:55:38 PM    Final     Scheduled Meds:  atorvastatin  20 mg Oral QPM   azithromycin  500 mg Oral Daily   bumetanide  1 mg Oral Daily   enoxaparin (LOVENOX) injection  40 mg Subcutaneous W10X   folic acid  1 mg Oral Daily   furosemide  40 mg Intravenous Once   guaiFENesin  600 mg Oral BID   ipratropium-albuterol  3 mL Nebulization Q6H   methocarbamol  750 mg Oral TID   methylPREDNISolone (SOLU-MEDROL) injection  40 mg Intravenous Q12H   multivitamin with minerals  1 tablet Oral Daily   nicotine  21 mg Transdermal Daily   polyethylene glycol  17 g Oral Daily   tamsulosin  0.4 mg Oral Daily   thiamine  100 mg Oral Daily   Continuous Infusions:  sodium chloride 10 mL/hr at 01/12/21 2254     LOS: 2 days   Roxan Hockey M.D on 01/14/2021 at 6:53 PM  Go to [email or web address] - for contact info  [facility] - Office  [phone]  If 7PM-7AM, please contact night-coverage [email or web address] Password  [facility] 01/14/2021, 6:53 PM

## 2021-01-15 DIAGNOSIS — E66813 Obesity, class 3: Secondary | ICD-10-CM | POA: Diagnosis present

## 2021-01-15 DIAGNOSIS — J441 Chronic obstructive pulmonary disease with (acute) exacerbation: Secondary | ICD-10-CM | POA: Diagnosis present

## 2021-01-15 LAB — BASIC METABOLIC PANEL
Anion gap: 10 (ref 5–15)
BUN: 31 mg/dL — ABNORMAL HIGH (ref 8–23)
CO2: 30 mmol/L (ref 22–32)
Calcium: 9.1 mg/dL (ref 8.9–10.3)
Chloride: 93 mmol/L — ABNORMAL LOW (ref 98–111)
Creatinine, Ser: 1.29 mg/dL — ABNORMAL HIGH (ref 0.61–1.24)
GFR, Estimated: >60 mL/min (ref >60)
Glucose, Bld: 124 mg/dL — ABNORMAL HIGH (ref 70–99)
Potassium: 3.6 mmol/L (ref 3.5–5.1)
Sodium: 133 mmol/L — ABNORMAL LOW (ref 135–145)

## 2021-01-15 LAB — LIPID PANEL
Cholesterol: 147 mg/dL (ref 0–200)
HDL: 36 mg/dL — ABNORMAL LOW (ref >40)
LDL Cholesterol: 94 mg/dL (ref 0–99)
Total CHOL/HDL Ratio: 4.1 RATIO
Triglycerides: 84 mg/dL (ref <150)
VLDL: 17 mg/dL (ref 0–40)

## 2021-01-15 MED ORDER — POTASSIUM CHLORIDE ER 10 MEQ PO TBCR: 10.0000 meq | EXTENDED_RELEASE_TABLET | Freq: Every day | ORAL | 2 refills | Status: DC

## 2021-01-15 MED ORDER — ALBUTEROL SULFATE (2.5 MG/3ML) 0.083% IN NEBU
2.5000 mg | INHALATION_SOLUTION | RESPIRATORY_TRACT | 12 refills | Status: DC | PRN
Start: 2021-01-15 — End: 2023-12-19

## 2021-01-15 MED ORDER — ATORVASTATIN CALCIUM 20 MG PO TABS
20.0000 mg | ORAL_TABLET | Freq: Every day | ORAL | 5 refills | Status: DC
Start: 2021-01-15 — End: 2021-09-11

## 2021-01-15 MED ORDER — ASPIRIN 81 MG PO TBEC: 81.0000 mg | DELAYED_RELEASE_TABLET | Freq: Every day | ORAL | 11 refills | Status: DC

## 2021-01-15 MED ORDER — BUMETANIDE 1 MG PO TABS: 1.0000 mg | ORAL_TABLET | Freq: Two times a day (BID) | ORAL | 2 refills | Status: DC

## 2021-01-15 MED ORDER — GUAIFENESIN ER 600 MG PO TB12: 600.0000 mg | ORAL_TABLET | Freq: Two times a day (BID) | ORAL | 0 refills | Status: DC

## 2021-01-15 MED ORDER — ALBUTEROL SULFATE HFA 108 (90 BASE) MCG/ACT IN AERS: 2.0000 | INHALATION_SPRAY | Freq: Four times a day (QID) | RESPIRATORY_TRACT | 2 refills | Status: DC | PRN

## 2021-01-15 MED ORDER — IPRATROPIUM-ALBUTEROL 0.5-2.5 (3) MG/3ML IN SOLN
3.0000 mL | Freq: Four times a day (QID) | RESPIRATORY_TRACT | 3 refills | Status: DC | PRN
Start: 2021-01-15 — End: 2023-09-05

## 2021-01-15 MED ORDER — NICOTINE 21 MG/24HR TD PT24: 21.0000 mg | MEDICATED_PATCH | Freq: Every day | TRANSDERMAL | 0 refills | Status: DC

## 2021-01-15 MED ORDER — TAMSULOSIN HCL 0.4 MG PO CAPS: 0.4000 mg | ORAL_CAPSULE | Freq: Every day | ORAL | 5 refills | Status: DC

## 2021-01-15 MED ORDER — AZITHROMYCIN 500 MG PO TABS: 500.0000 mg | ORAL_TABLET | Freq: Every day | ORAL | 0 refills | Status: AC

## 2021-01-15 MED ORDER — ADULT MULTIVITAMIN W/MINERALS CH: 1.0000 | ORAL_TABLET | Freq: Every day | ORAL | 3 refills | Status: DC

## 2021-01-15 MED ORDER — PREDNISONE 20 MG PO TABS: 40.0000 mg | ORAL_TABLET | Freq: Every day | ORAL | 0 refills | Status: AC

## 2021-01-15 NOTE — Discharge Summary (Signed)
[name], is a 65 y.o. male  DOB 11-Sep-1955  MRN [id number].  Admission date:  01/12/2021  Admitting Physician  Truett Mainland, DO  Discharge Date:  01/15/2021   Primary MD  Wannetta Sender, FNP  Recommendations for primary care physician for things to follow:   1)You need oxygen at home at 3 L via nasal cannula continuously while awake and while asleep--- smoking or having open fires around oxygen can cause fire, significant injury and death  2)Complete abstinence from tobacco advised  3)follow up with Drue Second IV, FNP in 1 to 2 weeks for recheck  Admission Diagnosis  Peripheral edema [R60.9] Acute respiratory failure with hypoxia ([place]) [J96.01] Acute on chronic respiratory failure with hypoxia (HCC) [J96.21] Altered mental status, unspecified altered mental status type [R41.82]   Discharge Diagnosis  Peripheral edema [R60.9] Acute respiratory failure with hypoxia ([place]) [J96.01] Acute on chronic respiratory failure with hypoxia (HCC) [J96.21] Altered mental status, unspecified altered mental status type [R41.82]    Principal Problem:   COPD with acute exacerbation ([place]) Active Problems:   Acute respiratory failure with hypoxia (HCC)   COPD (chronic obstructive pulmonary disease) (HCC)   Obesity, Class III, BMI 40-49.9 (morbid obesity) ([place])   Cancer ([place])   GERD (gastroesophageal reflux disease)   Chronic pain      Past Medical History:  Diagnosis Date   Anemia    IRON 2005   Anxiety    Arthritis    LOWER LUMBAR STENOSIS , ARTHRITIS HANDS AND SHOULDERS   Asthma    Cancer ([place]) 2005   COLON SURGERY AND CHEMO   Complication of anesthesia    WOKE UP DURING PORT PLACEMENT AND ENDOSCOPY, SLOW TO AWAKEN FROM COLON RESECTION 2010   COPD (chronic obstructive pulmonary disease) ([place])    Dyspnea    WITH EXERTION   GERD (gastroesophageal reflux disease)     Hydrocele, right    Neuropathy    FINGERS   Pneumonia 2016 LAST    X 3 EPISODES IN PAST   Vitamin D deficiency     Past Surgical History:  Procedure Laterality Date   COLON RESECTION  2010   HERNIA REPAIR     HYDROCELE EXCISION Right 12/16/2016   Procedure: HYDROCELECTOMY ADULT RGHT;  Surgeon: Lucas Mallow, MD;  Location: [facility];  Service: Urology;  Laterality: Right;   PORT PLACEMENT     LEFT CHEST   UPPER GI ENDOSCOPY        HPI  from the history and physical done on the day of admission:    Chief Complaint: SOB   HPI: [name] is a 66 y.o. male with a history of history of colon cancer status post surgery and chemotherapy, COPD, GERD, chronic pain on chronic narcotics.  Patient seen for shortness of breath that started last night.  He was initially brought in due to altered mental status and history is obtained by the daughter as well as the patient.  This morning,  she noticed that he was more altered, very somnolent, and falling asleep while she was talking to him.  He was off balance and had some mild generalized weakness.  No recent falls, no fevers, chills, nausea, vomiting.  He does have a base which is semiproductive.  The patient thinks his cough is slightly more often than his normal as well as feels that it is slightly looser than normal.   The patient is on gabapentin 300 mg 3 times a day, who is taking his narcotic medication, although he gets confused on his medications, but he does not feel like he took more than what he was supposed to.   Emergency Department Course: Chest x-ray shows bibasilar atelectasis.  No acute active disease.  CTA negative for PE.  Sodium slightly decreased to 129 creatinine at baseline at 2.06.  VBG was done showing a PCO2 of 62.  Patient was initially hypoxic on arrival with oxygen levels in the low 80s.  On 2 to 3 L his oxygen level goes up to about 92 to 93%.  He was given nebulizer treatment and steroids and  had some mild improvement   Review of Systems:  Pt denies any fevers, chills, nausea, vomiting, diarrhea, constipation, abdominal pain, shortness of breath, dyspnea on exertion, orthopnea, cough, wheezing, palpitations, headache, vision changes, lightheadedness, dizziness, melena, rectal bleeding.  Review of systems are otherwise negative     Hospital Course:     Brief Narrative:  65 y.o. male with a history of history of colon cancer status post surgery and chemotherapy, COPD, GERD, chronic pain currently smokes 4 packs of cigarettes a day admitted on 01/12/2021 with acute COPD exacerbation and hypoxia   A/p 1)Acute COPD/Emphysema Exacerbation- CTA chest without definite pneumonia,  treated with IV Solu-Medrol, mucolytics, azithromycin and bronchodilators as ordered, supplemental oxygen as ordered.  -Patient smokes up to 4 packs of cigarettes per day -Cough improved  -Wheezing resolved  -No further dyspnea at rest, dyspnea on exertion improved  -Hypoxia improved but did not resolve, okay to discharge on 3 L of oxygen via nasal cannula   2)Coronary artery calcification/Aortic Atherosclerosis--denies chest pains or ACS type symptoms -Patient smokes up to 4 packs of cigarettes per day -Smoking cessation strongly advised -Echo from 01/13/2021 with EF of 60 to 96% without diastolic dysfunction -Advised aspirin and Lipitor LDL is 94, HDL is 36---Even if his lipid panel is within desired limits, patient should still take Lipitor/Statin for it's Pleiotropic effects (beyond cholesterol lowering benefits)  3)Tobacco Abuse--Patient smokes up to 4 packs of cigarettes per day -Smoking cessation strongly advised patient is not ready to quit   4)Bilateral shoulder pain/chronic pain syndrome--- continue home pain medications, methocarbamol as ordered, -Patient has MRI of the left shoulder, C-spine and L-spine planned for 01/19/2021   5)Tremors/Jerks--patient restarted when he was started on  gabapentin -Improved after Discontinuation of  gabapentin    6)Reformed Alcoholic--- with neuropathy type symptoms, see 5 above -Denies current EtOH use -Serum folate, B12---are Not low -Thiamine folic acid and multivitamin   7)BPH--continue Flomax   Disposition--home with home oxygen   Disposition: The patient is from: Home              Anticipated d/c is to: Home              Code Status :  -  Code Status: Full Code    Family Communication:    NA (patient is alert, awake and coherent)    Consults  :  na  Discharge Condition: Stable  Follow UP   Follow-up Information     [facility] Follow up.   Why: Home Oxygen Contact information: [place] [id number] [phone]                Diet and Activity recommendation:  As advised  Discharge Instructions    Discharge Instructions     Call MD for:  difficulty breathing, headache or visual disturbances   Complete by: As directed    Call MD for:  persistant dizziness or light-headedness   Complete by: As directed    Call MD for:  persistant nausea and vomiting   Complete by: As directed    Call MD for:  temperature >100.4   Complete by: As directed    Diet - low sodium heart healthy   Complete by: As directed    Discharge instructions   Complete by: As directed    1)You need oxygen at home at 3 L via nasal cannula continuously while awake and while asleep--- smoking or having open fires around oxygen can cause fire, significant injury and death  2)Complete abstinence from tobacco advised  3)follow up with Drue Second IV, FNP in 1 to 2 weeks for recheck   Increase activity slowly   Complete by: As directed         Discharge Medications     Allergies as of 01/15/2021   No Known Allergies      Medication List     STOP taking these medications    chlorthalidone 25 MG tablet Commonly known as: HYGROTON   clindamycin 300 MG capsule Commonly known as: CLEOCIN    HYDROcodone-acetaminophen 5-325 MG tablet Commonly known as: NORCO/VICODIN   ibuprofen 800 MG tablet Commonly known as: ADVIL   lisinopril 10 MG tablet Commonly known as: ZESTRIL   nystatin powder Commonly known as: MYCOSTATIN/NYSTOP       TAKE these medications    albuterol 108 (90 Base) MCG/ACT inhaler Commonly known as: VENTOLIN HFA Inhale 2 puffs into the lungs every 6 (six) hours as needed for wheezing or shortness of breath. What changed: how much to take   albuterol (2.5 MG/3ML) 0.083% nebulizer solution Commonly known as: PROVENTIL Take 3 mLs (2.5 mg total) by nebulization every 4 (four) hours as needed for wheezing or shortness of breath. What changed: Another medication with the same name was changed. Make sure you understand how and when to take each.   aspirin 81 MG EC tablet Take 1 tablet (81 mg total) by mouth daily with breakfast. Swallow whole. Start taking on: January 16, 2021 What changed:  medication strength how much to take when to take this reasons to take this additional instructions   atorvastatin 20 MG tablet Commonly known as: LIPITOR Take 1 tablet (20 mg total) by mouth daily.   azithromycin 500 MG tablet Commonly known as: ZITHROMAX Take 1 tablet (500 mg total) by mouth daily for 3 days.   bumetanide 1 MG tablet Commonly known as: BUMEX Take 1 tablet (1 mg total) by mouth 2 (two) times daily. What changed: when to take this   calcium carbonate 500 MG chewable tablet Commonly known as: TUMS - dosed in mg elemental calcium Chew 1 tablet as needed by mouth for indigestion or heartburn.   gabapentin 300 MG capsule Commonly known as: NEURONTIN Take 300 mg by mouth 3 (three) times daily.   guaiFENesin 600 MG 12 hr tablet Commonly known as: MUCINEX Take 1 tablet (  600 mg total) by mouth 2 (two) times daily.   ipratropium-albuterol 0.5-2.5 (3) MG/3ML Soln Commonly known as: DUONEB Take 3 mLs by nebulization every 6 (six) hours as  needed (for shortness of breath).   multivitamin with minerals Tabs tablet Take 1 tablet by mouth daily. Start taking on: January 16, 2021   nicotine 21 mg/24hr patch Commonly known as: NICODERM CQ - dosed in mg/24 hours Place 1 patch (21 mg total) onto the skin daily. Start taking on: January 16, 2021   oxyCODONE-acetaminophen 5-325 MG tablet Commonly known as: PERCOCET/ROXICET Take 1 tablet by mouth every 6 (six) hours as needed for severe pain or moderate pain.   potassium chloride 10 MEQ tablet Commonly known as: KLOR-CON Take 1 tablet (10 mEq total) by mouth daily. Take While taking Lasix/furosemide   predniSONE 20 MG tablet Commonly known as: DELTASONE Take 2 tablets (40 mg total) by mouth daily with breakfast for 5 days. What changed:  medication strength how much to take how to take this when to take this additional instructions   tamsulosin 0.4 MG Caps capsule Commonly known as: FLOMAX Take 1 capsule (0.4 mg total) by mouth daily after supper. What changed: when to take this               Durable Medical Equipment  (From admission, onward)           Start     Ordered   01/15/21 1405  For home use only DME Nebulizer machine  Once       Question Answer Comment  Patient needs a nebulizer to treat with the following condition COPD (chronic obstructive pulmonary disease) ([place])   Length of Need Lifetime      01/15/21 1404   01/15/21 1342  For home use only DME oxygen  Once       Comments: SATURATION QUALIFICATIONS: (This note is used to comply with regulatory documentation for home oxygen)  Patient Saturations on Room Air at Rest = 89%  Patient Saturations on Room Air while Ambulating = 84%  Patient Saturations on 3 Liters of oxygen while Ambulating = 94%   Diagnosis-- COPD  Roxan Hockey, MD  Question Answer Comment  Length of Need Lifetime   Mode or (Route) Nasal cannula   Liters per Minute 3   Frequency Continuous (stationary and  portable oxygen unit needed)   Oxygen conserving device Yes   Oxygen delivery system Gas      01/15/21 1341           Major procedures and Radiology Reports - PLEASE review detailed and final reports for all details, in brief -   DG Chest 2 View  Result Date: 01/12/2021 CLINICAL DATA:  Edema with wheezing. EXAM: CHEST - 2 VIEW COMPARISON:  Chest x-ray dated December 29, 2020. FINDINGS: Unchanged left chest wall port catheter. The heart size and mediastinal contours are within normal limits. Normal pulmonary vascularity. Low lung volumes with bibasilar linear atelectasis/scarring. Unchanged right upper lobe nodule, stable since at least 2018. No focal consolidation, pleural effusion, or pneumothorax. No acute osseous abnormality. IMPRESSION: 1. No active disease.  Bibasilar atelectasis/scarring. Electronically Signed   By: Titus Dubin M.D.   On: 01/12/2021 15:11   CT Head Wo Contrast  Result Date: 01/12/2021 CLINICAL DATA:  Mental status change, unknown cause EXAM: CT HEAD WITHOUT CONTRAST TECHNIQUE: Contiguous axial images were obtained from the base of the skull through the vertex without intravenous contrast. COMPARISON:  Head CT 01/02/2012 FINDINGS: Brain:  No evidence of acute intracranial hemorrhage or extra-axial collection.No evidence of mass lesion/concern mass effect.The ventricles are normal in size. Vascular: No hyperdense vessel or unexpected calcification. Skull: Normal. Negative for fracture or focal lesion. Sinuses/Orbits: Mild ethmoid air cell mucosal thickening. Other: None. IMPRESSION: No acute intracranial abnormality. Electronically Signed   By: Maurine Simmering M.D.   On: 01/12/2021 14:38   CT Angio Chest PE W and/or Wo Contrast  Result Date: 01/12/2021 CLINICAL DATA:  Lethargy, edema, confusion, concern for PE EXAM: CT ANGIOGRAPHY CHEST WITH CONTRAST TECHNIQUE: Multidetector CT imaging of the chest was performed using the standard protocol during bolus administration of  intravenous contrast. Multiplanar CT image reconstructions and MIPs were obtained to evaluate the vascular anatomy. CONTRAST:  49mL OMNIPAQUE IOHEXOL 350 MG/ML SOLN COMPARISON:  Same day chest radiograph, CT chest 11/09/2016 FINDINGS: Cardiovascular: There is adequate opacification of the pulmonary arteries to the segmental level. There is no evidence of acute pulmonary embolism. The heart is not enlarged. There is no pericardial effusion. Coronary artery calcifications and calcified atherosclerotic plaque of the aortic arch are noted. Mediastinum/Nodes: The imaged thyroid is unremarkable. The esophagus is grossly unremarkable. There is no mediastinal, hilar, or axillary lymphadenopathy. Lungs/Pleura: The trachea and central airways are patent. There is a background of centrilobular emphysema. Linear opacities in the lung bases likely reflect atelectasis. There is no focal consolidation or pulmonary edema. There is no pleural effusion or pneumothorax. The 1.2 cm x 0.8 cm nodule in the right lung apex is unchanged since 2018 and decreased in size since 2016, likely benign. Upper Abdomen: Exophytic left renal cysts are noted. There are no acute findings in the imaged upper abdomen. Musculoskeletal: There is no acute osseous abnormality or aggressive osseous lesion. Review of the MIP images confirms the above findings. IMPRESSION: 1. No evidence of pulmonary embolism to the segmental level. 2. No focal consolidation or pleural effusion. 3. Bibasilar subsegmental atelectasis. 4. Coronary artery calcifications, aortic Atherosclerosis (ICD10-I70.0), and Emphysema (ICD10-J43.9). Electronically Signed   By: Valetta Mole M.D.   On: 01/12/2021 16:06   MR Cervical Spine W or Wo Contrast  Result Date: 12/29/2020 CLINICAL DATA:  Cervical radiculopathy.  Spinal stenosis. EXAM: MRI CERVICAL SPINE WITHOUT AND WITH CONTRAST TECHNIQUE: Multiplanar and multiecho pulse sequences of the cervical spine, to include the craniocervical  junction and cervicothoracic junction, were obtained without and with intravenous contrast. CONTRAST:  39mL GADAVIST GADOBUTROL 1 MMOL/ML IV SOLN COMPARISON:  CT cervical spine 12/15/2020 FINDINGS: Alignment: Mild anterolisthesis C4-5 Vertebrae: No vertebral body fracture or mass. Cord: Cord evaluation is not well evaluated due to significant motion on the study. There is a calcified lesion in the spinal canal on the left at C4-5. This measures approximately 5 x 7 x 10 mm and is noted on the recent CT. This is located medial to the degenerated facet. This is very low signal on T1 and T2 and does not enhance. This is contributing to left foraminal encroachment. Posterior Fossa, vertebral arteries, paraspinal tissues: Negative Disc levels: C2-3: Right facet degeneration.  Negative for stenosis C3-4: Mild foraminal narrowing bilaterally due to uncinate spurring and facet hypertrophy C4-5: Disc degeneration and facet degeneration. Left foraminal encroachment due to spurring. Calcified mass in the left lateral spinal canal as described above contributes to left foraminal encroachment. C5-6: Disc degeneration and spondylosis. Mild spinal stenosis and moderate foraminal stenosis bilaterally. C6-7: Disc degeneration with diffuse uncinate spurring. Mild spinal stenosis and moderate foraminal stenosis bilaterally C7-T1: Bilateral facet degeneration. Mild foraminal narrowing bilaterally.  IMPRESSION: 1. Image quality degraded by extensive motion 2. Multilevel spondylosis in the cervical spine 3. Calcified lesion in the left lateral spinal canal at C4-5 as noted on CT. This does not enhance. Most meningiomas will show enhancement. This could be a degenerative calcification possibly related to calcified synovial cyst from the left-sided facet joint which is significantly degenerated. Electronically Signed   By: Franchot Gallo M.D.   On: 12/29/2020 19:19   DG Chest Port 1 View  Result Date: 12/29/2020 CLINICAL DATA:   Questionable sepsis, shortness of breath, weakness EXAM: PORTABLE CHEST 1 VIEW COMPARISON:  Chest x-ray 06/27/2016, CT chest 09/10/2014 FINDINGS: Left chest wall Port-A-Cath with tip overlying the expected region of the left subclavian artery in the region of its confluence with the superior vena cava. The heart and mediastinal contours are unchanged. Aortic calcification. Persistent chronic 1.3 cm right upper lobe nodule that is better evaluated on CT chest 09/09/2016. Bibasilar atelectasis. No focal consolidation. Slightly increased interstitial markings with no overt pulmonary edema. No pleural effusion. No pneumothorax. No acute osseous abnormality. IMPRESSION: 1. Slightly increased interstitial markings with no overt pulmonary edema. 2. Persistent chronic 1.3 cm right upper lobe nodule that is better evaluated on CT chest 09/09/2016. Electronically Signed   By: Iven Finn M.D.   On: 12/29/2020 16:27   DG Chest Port 1V same Day  Result Date: 12/29/2020 CLINICAL DATA:  Shortness of breath EXAM: PORTABLE CHEST 1 VIEW COMPARISON:  Previous studies including the examination done earlier today FINDINGS: Transverse diameter of heart is in the upper limits of normal. Apparent shift of mediastinum to the right may be due to rotation. Increased interstitial markings are seen in both lower lung fields suggesting scarring and possibly superimposed interstitial pneumonia. There is no focal consolidation. There is no significant pleural effusion or pneumothorax. There is an ill-defined 1.3 cm radiopacity in the right upper lung field overlying the anterior right second rib which has not changed. This may suggest scarring from previous inflammation or parenchymal nodule such as granuloma or neoplasm. A similar sized nodule was seen in the right upper lung fields in the CT chest done on 09/09/2016. IMPRESSION: Increased interstitial markings are seen in the lower lung fields, more so on the left side suggesting  interstitial pneumonia and possibly underlying scarring. There is no focal pulmonary consolidation. There is no pleural effusion or pneumothorax. There is an ill-defined 1.3 cm nodular density in the right upper lung fields with no significant change Electronically Signed   By: Elmer Picker M.D.   On: 12/29/2020 18:04   ECHOCARDIOGRAM COMPLETE  Result Date: 01/13/2021    ECHOCARDIOGRAM REPORT   Patient Name:   [name] Date of Exam: 01/13/2021 Medical Rec #:  [id number]         Height:       69.0 in Accession #:    [id number]        Weight:       271.4 lb Date of Birth:  [birth year]        BSA:          2.352 m Patient Age:    40 years          BP:           132/83 mmHg Patient Gender: M                 HR:           90 bpm. Exam Location:  [facility] Procedure:  2D Echo, Intracardiac Opacification Agent, Cardiac Doppler and Color            Doppler Indications:    R06.02 SOB  History:        Patient has no prior history of Echocardiogram examinations.                 COPD; Signs/Symptoms:Shortness of Breath, Dyspnea and Altered                 Mental Status. Cancer. Chemo. Chronic narcotic use.  Sonographer:    Roseanna Rainbow RDCS Referring Phys: [phone] JACOB J STINSON  Sonographer Comments: Technically difficult study due to poor echo windows, no parasternal window, suboptimal apical window, no subcostal window and patient is morbidly obese. Image acquisition challenging due to patient body habitus, Image acquisition challenging due to uncooperative patient and Image acquisition challenging due to COPD. Patient could not follow directions. Supine. IMPRESSIONS  1. Technically difficult echo. Limited views- no parasternal views.  2. Left ventricular ejection fraction, by estimation, is 60 to 65%. The left ventricle has normal function. The left ventricle has no regional wall motion abnormalities. Left ventricular diastolic parameters were normal.  3. Right ventricular systolic function is normal. The  right ventricular size is normal.  4. The mitral valve was not well visualized. No evidence of mitral valve regurgitation.  5. The aortic valve was not well visualized. Aortic valve regurgitation is not visualized. FINDINGS  Left Ventricle: Left ventricular ejection fraction, by estimation, is 60 to 65%. The left ventricle has normal function. The left ventricle has no regional wall motion abnormalities. Definity contrast agent was given IV to delineate the left ventricular  endocardial borders. The left ventricular internal cavity size was normal in size. Suboptimal image quality limits for assessment of left ventricular hypertrophy. Left ventricular diastolic parameters were normal. Right Ventricle: The right ventricular size is normal. Right vetricular wall thickness was not well visualized. Right ventricular systolic function is normal. Left Atrium: Left atrial size was normal in size. Right Atrium: Right atrial size was normal in size. Pericardium: There is no evidence of pericardial effusion. Mitral Valve: The mitral valve was not well visualized. No evidence of mitral valve regurgitation. Tricuspid Valve: The tricuspid valve is not well visualized. Tricuspid valve regurgitation is not demonstrated. Aortic Valve: The aortic valve was not well visualized. Aortic valve regurgitation is not visualized. Pulmonic Valve: The pulmonic valve was not well visualized. Pulmonic valve regurgitation is not visualized. Aorta: The aortic root was not well visualized. IAS/Shunts: The atrial septum is grossly normal.   LV Volumes (MOD) LV vol d, MOD A2C: 101.0 ml Diastology LV vol d, MOD A4C: 98.6 ml  LV e' medial:    7.18 cm/s LV vol s, MOD A2C: 46.8 ml  LV E/e' medial:  9.4 LV vol s, MOD A4C: 37.2 ml  LV e' lateral:   8.52 cm/s LV SV MOD A2C:     54.2 ml  LV E/e' lateral: 7.9 LV SV MOD A4C:     98.6 ml LV SV MOD BP:      57.4 ml RIGHT VENTRICLE             IVC RV S prime:     13.65 cm/s  IVC diam: 2.10 cm TAPSE (M-mode): 2.1  cm LEFT ATRIUM             Index        RIGHT ATRIUM           Index  LA Vol (A2C):   43.5 ml 18.49 ml/m  RA Area:     14.30 cm LA Vol (A4C):   27.5 ml 11.69 ml/m  RA Volume:   29.00 ml  12.33 ml/m LA Biplane Vol: 35.9 ml 15.26 ml/m  AORTIC VALVE LVOT Vmax:   124.00 cm/s LVOT Vmean:  78.200 cm/s LVOT VTI:    0.194 m MITRAL VALVE MV Area (PHT): 4.15 cm    SHUNTS MV Decel Time: 183 msec    Systemic VTI: 0.19 m MV E velocity: 67.30 cm/s MV A velocity: 88.70 cm/s MV E/A ratio:  0.76 Mertie Moores MD Electronically signed by Mertie Moores MD Signature Date/Time: 01/13/2021/1:55:38 PM    Final     Micro Results   Recent Results (from the past 240 hour(s))  Resp Panel by RT-PCR (Flu A&B, Covid) Nasopharyngeal Swab     Status: None   Collection Time: 01/12/21  2:36 PM   Specimen: Nasopharyngeal Swab; Nasopharyngeal(NP) swabs in vial transport medium  Result Value Ref Range Status   SARS Coronavirus 2 by RT PCR NEGATIVE NEGATIVE Final    Comment: (NOTE) SARS-CoV-2 target nucleic acids are NOT DETECTED.  The SARS-CoV-2 RNA is generally detectable in upper respiratory specimens during the acute phase of infection. The lowest concentration of SARS-CoV-2 viral copies this assay can detect is 138 copies/mL. A negative result does not preclude SARS-Cov-2 infection and should not be used as the sole basis for treatment or other patient management decisions. A negative result may occur with  improper specimen collection/handling, submission of specimen other than nasopharyngeal swab, presence of viral mutation(s) within the areas targeted by this assay, and inadequate number of viral copies(<138 copies/mL). A negative result must be combined with clinical observations, patient history, and epidemiological information. The expected result is Negative.  Fact Sheet for Patients:  EntrepreneurPulse.com.au  Fact Sheet for Healthcare Providers:   IncredibleEmployment.be  This test is no t yet approved or cleared by the [place] FDA and  has been authorized for detection and/or diagnosis of SARS-CoV-2 by FDA under an Emergency Use Authorization (EUA). This EUA will remain  in effect (meaning this test can be used) for the duration of the COVID-19 declaration under Section 564(b)(1) of the Act, 21 U.S.C.section 360bbb-3(b)(1), unless the authorization is terminated  or revoked sooner.       Influenza A by PCR NEGATIVE NEGATIVE Final   Influenza B by PCR NEGATIVE NEGATIVE Final    Comment: (NOTE) The Xpert Xpress SARS-CoV-2/FLU/RSV plus assay is intended as an aid in the diagnosis of influenza from Nasopharyngeal swab specimens and should not be used as a sole basis for treatment. Nasal washings and aspirates are unacceptable for Xpert Xpress SARS-CoV-2/FLU/RSV testing.  Fact Sheet for Patients: EntrepreneurPulse.com.au  Fact Sheet for Healthcare Providers: IncredibleEmployment.be  This test is not yet approved or cleared by the [place] FDA and has been authorized for detection and/or diagnosis of SARS-CoV-2 by FDA under an Emergency Use Authorization (EUA). This EUA will remain in effect (meaning this test can be used) for the duration of the COVID-19 declaration under Section 564(b)(1) of the Act, 21 U.S.C. section 360bbb-3(b)(1), unless the authorization is terminated or revoked.  Performed at [facility], [address], [place], [place] [address]    Today   Subjective    [name] today has no new concerns No fever  Or chills   No Nausea, Vomiting or Diarrhea          Patient has been seen  and examined prior to discharge   Objective   Blood pressure 122/66, pulse 82, temperature 97.9 F (36.6 C), temperature source Oral, resp. rate 18, height 5\' 9"  (1.753 m), weight 123.1 kg, SpO2 92 %.   Intake/Output Summary (Last 24 hours) at  01/15/2021 1600 Last data filed at 01/15/2021 1100 Gross per 24 hour  Intake 1320 ml  Output --  Net 1320 ml   Exam Gen:- Awake Alert, no acute distress, morbidly obese, speaking in complete sentences HEENT:- [place].AT, No sclera icterus Nose- [place] 3L/min Neck-Supple Neck,No JVD,.  Lungs-improved air movement, no wheezing CV- S1, S2 normal, regular Abd-  +ve B.Sounds, Abd Soft, No tenderness, increased truncal adiposity Extremity/Skin:- +1  edema,   good pulses Psych-affect is appropriate, oriented x3 Neuro-no new focal deficits, no tremors    Data Review   CBC w Diff:  Lab Results  Component Value Date   WBC 6.1 01/13/2021   HGB 13.6 01/13/2021   HCT 41.4 01/13/2021   PLT 206 01/13/2021   LYMPHOPCT 13 01/12/2021   MONOPCT 9 01/12/2021   EOSPCT 2 01/12/2021   BASOPCT 0 01/12/2021    CMP:  Lab Results  Component Value Date   NA 133 (L) 01/15/2021   K 3.6 01/15/2021   CL 93 (L) 01/15/2021   CO2 30 01/15/2021   BUN 31 (H) 01/15/2021   CREATININE 1.29 (H) 01/15/2021   PROT 6.3 (L) 01/12/2021   ALBUMIN 3.4 (L) 01/12/2021   BILITOT 0.6 01/12/2021   ALKPHOS 124 01/12/2021   AST 18 01/12/2021   ALT 16 01/12/2021  .   Total Discharge time is about 33 minutes  Roxan Hockey M.D on 01/15/2021 at 4:00 PM  Go to [email or web address] -  for contact info  [facility] - Office  [phone]

## 2021-01-15 NOTE — Progress Notes (Addendum)
SATURATION QUALIFICATIONS: (This note is used to comply with regulatory documentation for home oxygen)  Patient Saturations on Room Air at Rest = 89%  Patient Saturations on Room Air while Ambulating = 84%  Patient Saturations on 3 Liters of oxygen while Ambulating = 94%   Diagnosis-- COPD  Roxan Hockey, MD

## 2021-01-15 NOTE — Care Management Important Message (Signed)
Important Message  Patient Details  Name: [name] MRN: [id number] Date of Birth: [birth year]   Medicare Important Message Given:  Yes     Tommy Medal 01/15/2021, 12:10 PM

## 2021-01-15 NOTE — TOC Transition Note (Addendum)
Transition of Care [facility]) - CM/SW Discharge Note   Patient Details  Name: [name] MRN: [id number] Date of Birth: [birth year]  Transition of Care [facility]) CM/SW Contact:  Boneta Lucks, RN Phone Number: 01/15/2021, 2:44 PM   Clinical Narrative:   Patient admitted with Acute respiratory failure, discharging home needing oxygen. Ashly with [employer] will delivery to the room. RN updated.   Addendum: Neb machine delivered also. MD can order neb meds online at [facility]. MD updated.   Final next level of care: Home/Self Care Barriers to Discharge: Barriers Resolved   Patient Goals and CMS Choice Patient states their goals for this hospitalization and ongoing recovery are:: to go home. CMS Medicare.gov Compare Post Acute Care list provided to:: Patient Choice offered to / list presented to : Patient  Discharge Placement    Patient and family notified of of transfer: 01/15/21  Discharge Plan and Services               DME Arranged: Oxygen DME Agency: [employer] [facility] Date DME Agency Contacted: 01/15/21 Time DME Agency Contacted: 78 Representative spoke with at DME Agency: Ashly     Readmission Risk Interventions Readmission Risk Prevention Plan 01/15/2021  Medication Screening Complete  Transportation Screening Complete  Some recent data might be hidden

## 2021-01-15 NOTE — Discharge Instructions (Signed)
1)You need oxygen at home at 3 L via nasal cannula continuously while awake and while asleep--- smoking or having open fires around oxygen can cause fire, significant injury and death  2)Complete abstinence from tobacco advised  3)follow up with Drue Second IV, FNP in 1 to 2 weeks for recheck

## 2021-01-19 ENCOUNTER — Ambulatory Visit (HOSPITAL_COMMUNITY)
Admission: RE | Admit: 2021-01-19 | Discharge: 2021-01-19 | Disposition: A | Discharge status: Home / Self Care | Payer: Medicare Other | Source: Ambulatory Visit | Attending: Neurological Surgery | Admitting: Neurological Surgery

## 2021-01-19 ENCOUNTER — Other Ambulatory Visit: Payer: Self-pay

## 2021-01-19 DIAGNOSIS — G9519 Other vascular myelopathies: Secondary | ICD-10-CM | POA: Insufficient documentation

## 2021-01-19 DIAGNOSIS — G8929 Other chronic pain: Secondary | ICD-10-CM | POA: Diagnosis present

## 2021-01-19 DIAGNOSIS — M25512 Pain in left shoulder: Secondary | ICD-10-CM | POA: Diagnosis not present

## 2021-01-19 IMAGING — MR MR LUMBAR SPINE W/O CM
4 of 5 series · 31 of 48 positions shown · non-contrast
Comparison: CT abdomen pelvis dated [DATE].

CLINICAL DATA: Low back pain and difficulty walking for the past 4
months.

EXAM:
MRI LUMBAR SPINE WITHOUT CONTRAST
TECHNIQUE: Multiplanar, multisequence MR imaging of the lumbar spine was
performed. No intravenous contrast was administered.

[Series 9: T2 · sagittal · 4.0mm · 0.68mm/px · 6 of 16 slices shown (1 of 2)]
[im 1/16]
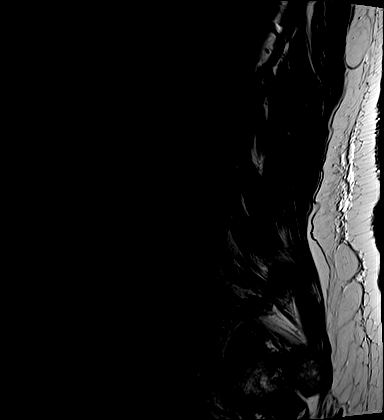
[im 4/16]
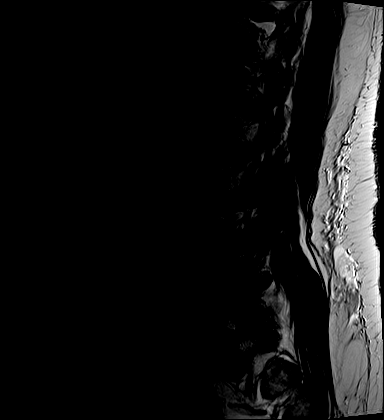
[im 7/16]
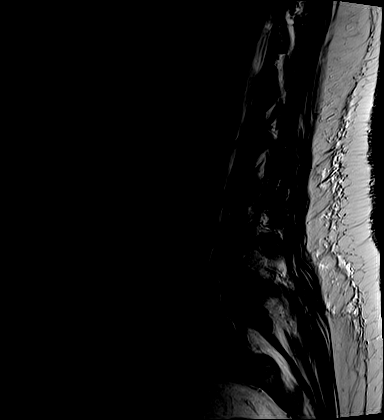
[im 10/16]
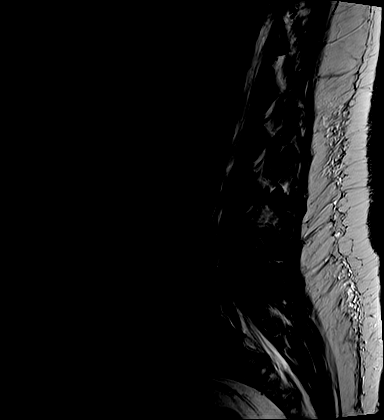
[im 13/16]
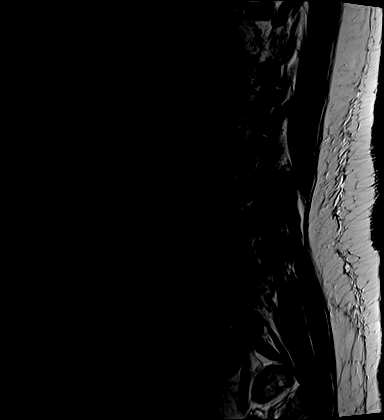
[im 16/16]
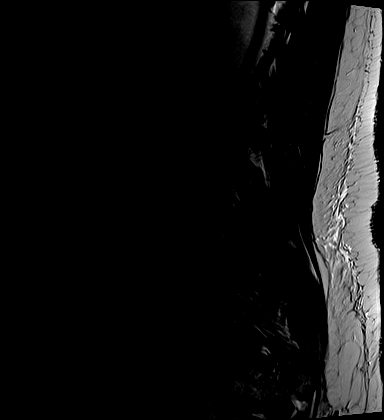

[Series 10: T1 · sagittal · 4.0mm · 0.81mm/px · 5 of 15 slices shown (1 of 2)]
[im 1/15]
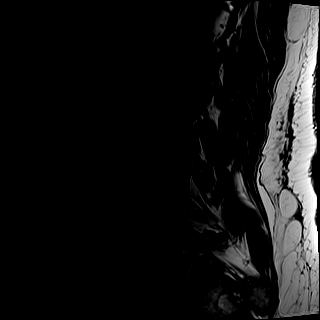
[im 4/15]
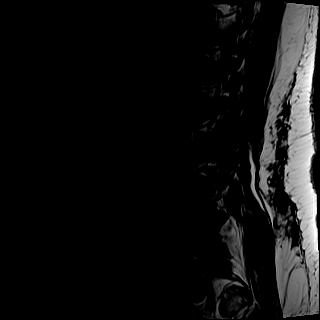
[im 8/15]
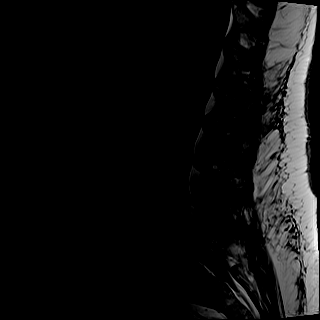
[im 11/15]
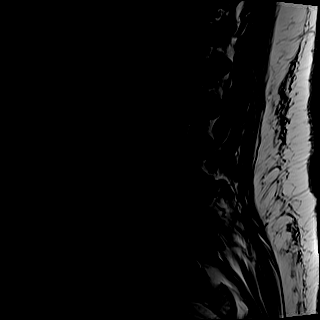
[im 15/15]
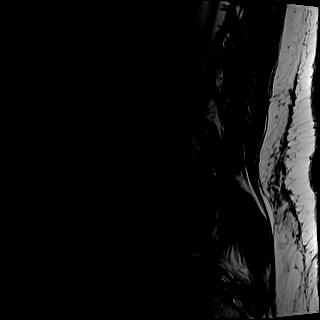

[Series 12: T2 · axial · 4.0mm · 0.70mm/px · z∈[-496,-253]mm · 10 of 46 slices shown (2 of 2)]
[im 4/46]
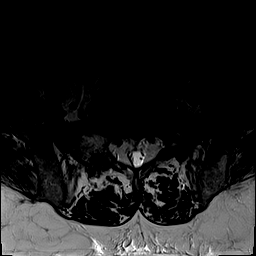
[im 7/46]
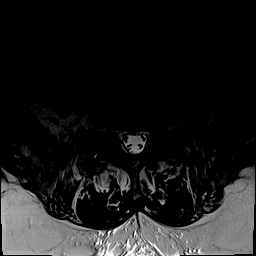
[im 10/46]
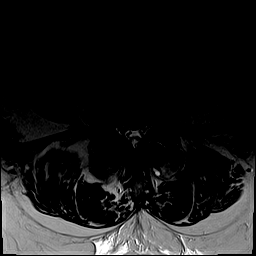
[im 16/46]
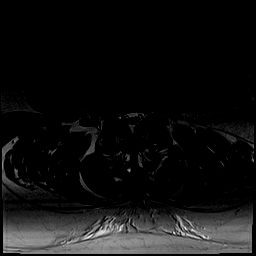
[im 22/46]
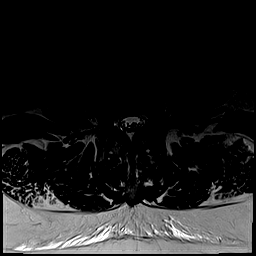
[im 25/46]
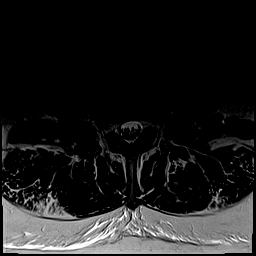
[im 28/46]
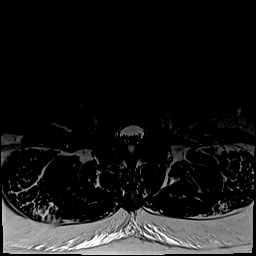
[im 34/46]
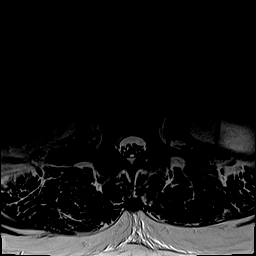
[im 40/46]
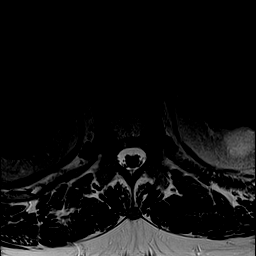
[im 46/46]
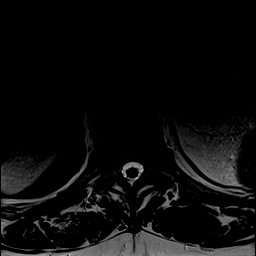

[Series 13: T1 · axial · 4.0mm · 0.35mm/px · z∈[-496,-253]mm · 10 of 46 slices shown (2 of 2)]
[im 4/46]
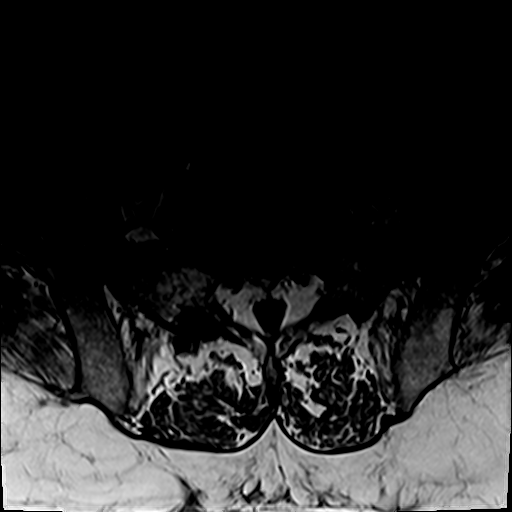
[im 7/46]
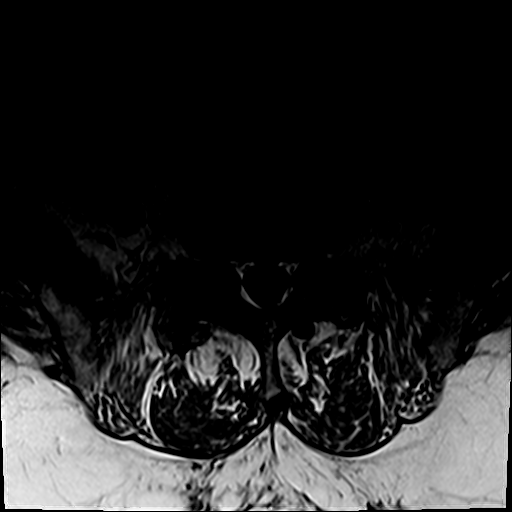
[im 10/46]
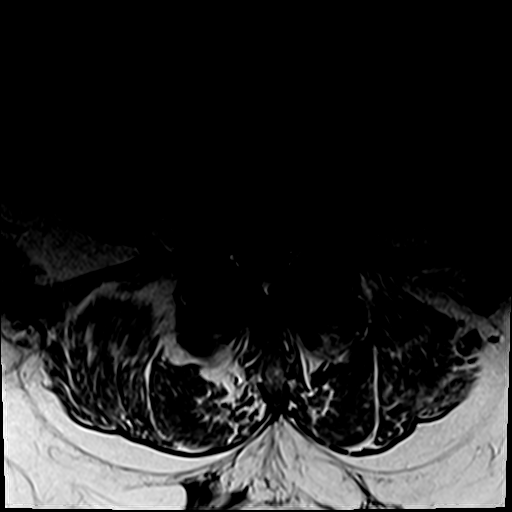
[im 16/46]
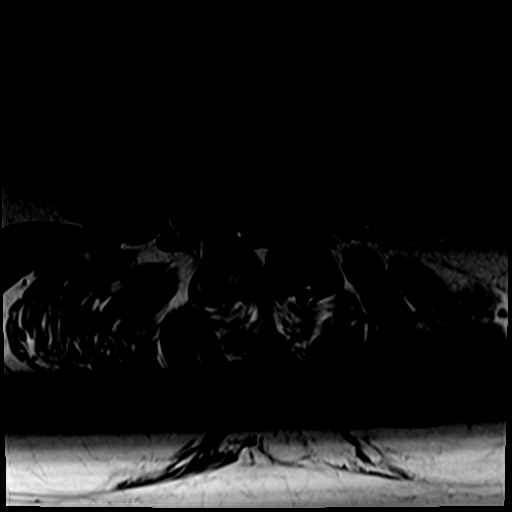
[im 22/46]
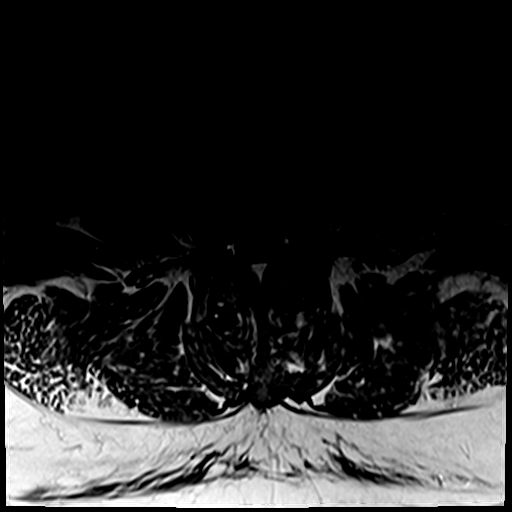
[im 25/46]
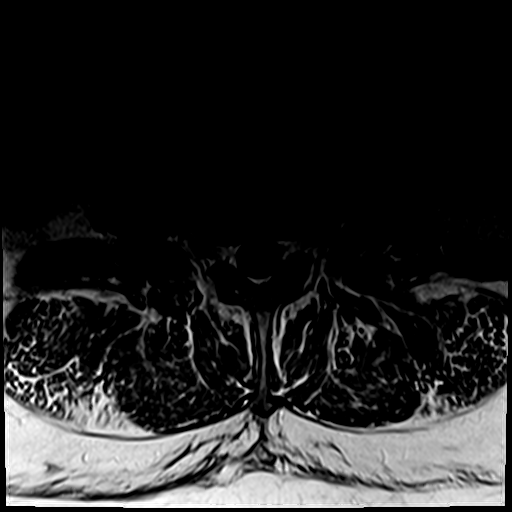
[im 28/46]
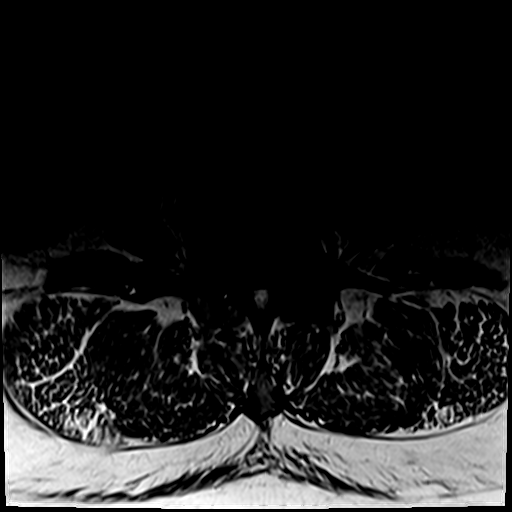
[im 34/46]
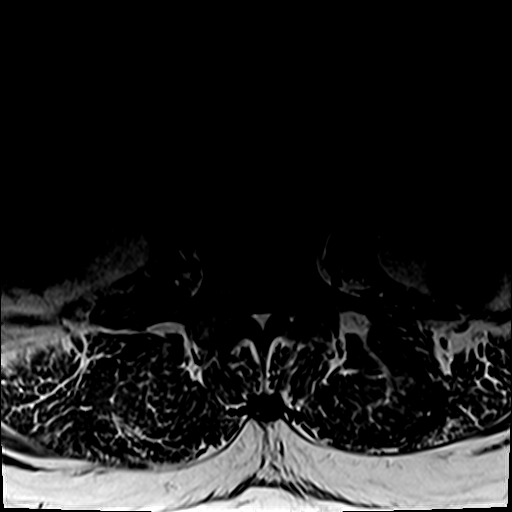
[im 40/46]
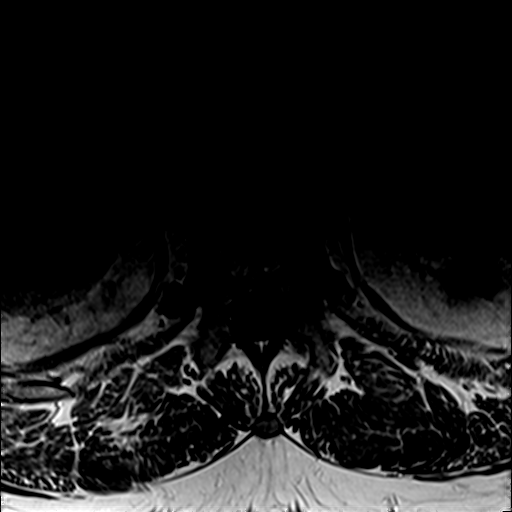
[im 46/46]
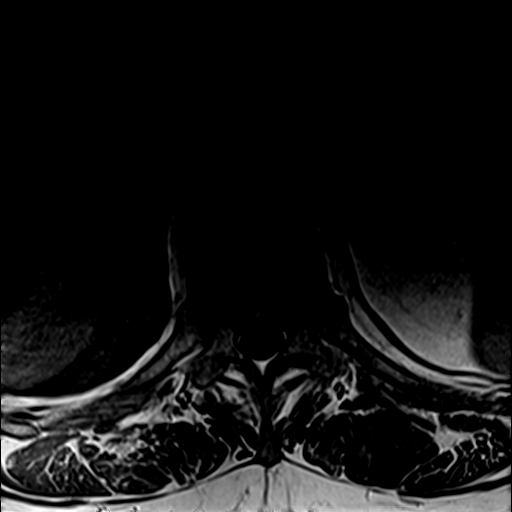

[31 of 48 positions shown; findings below may reference images not displayed]

FINDINGS: Segmentation:  Standard.

Alignment:  Physiologic.

Vertebrae: No fracture, evidence of discitis, or bone lesion.
Unchanged T12 hemangioma.

Conus medullaris and cauda equina: Conus extends to the L1-L2 level.
Conus and cauda equina appear normal.

Paraspinal and other soft tissues: Left renal cyst. Otherwise
negative.

Disc levels:

T11-T12: Negative.

T12-L1: Mild disc degeneration without significant disc bulge or
herniation. No stenosis.

L1-L2:  Mild disc bulging.  No stenosis.

L2-L3: Mild foraminal disc bulging and bilateral facet arthropathy.
No stenosis.

L3-L4: Mild disc bulging and moderate bilateral facet arthropathy.
Mild-to-moderate spinal canal stenosis. Mild bilateral
neuroforaminal stenosis.

L4-L5: Tiny shallow right foraminal and far lateral broad-based disc
protrusion encroaching on the exiting right L4 nerve root. Severe
bilateral facet arthropathy with ligamentum flavum hypertrophy.
Mild-to-moderate spinal canal stenosis and mild bilateral lateral
recess stenosis. Mild-to-moderate right neuroforaminal stenosis. No
left neuroforaminal stenosis.

L5-S1: Negative disc. Severe right and moderate left facet
arthropathy. Mild bilateral neuroforaminal stenosis. No spinal canal
stenosis.
IMPRESSION: 1. Multilevel lumbar spondylosis as described above, worst at L3-L4
and L4-L5 where there is mild-to-moderate spinal canal stenosis.

## 2021-01-19 IMAGING — MR MR SHOULDER*L* W/O CM
6 series · 40 of 40 positions shown · non-contrast
Comparison: None.

CLINICAL DATA: Left shoulder pain for the past 3 weeks.

EXAM:
MRI OF THE LEFT SHOULDER WITHOUT CONTRAST
TECHNIQUE: Multiplanar, multisequence MR imaging of the shoulder was performed.
No intravenous contrast was administered.

[Series 5: T2 fat-sat · axial · left · 4.0mm · 0.47mm/px · z∈[-104,+10]mm · 7 of 26 slices shown (1 of 3)]
[im 1/26]
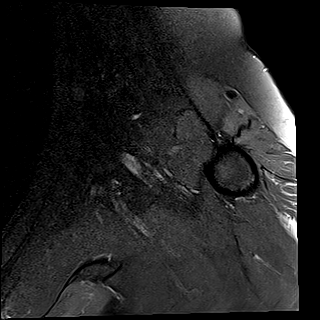
[im 5/26]
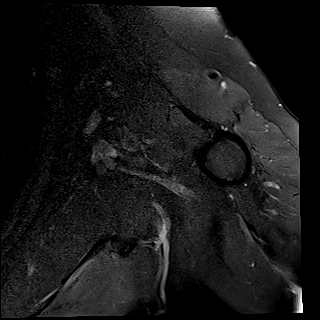
[im 9/26]
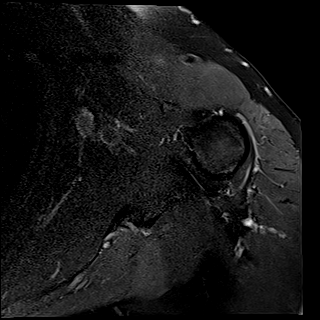
[im 13/26]
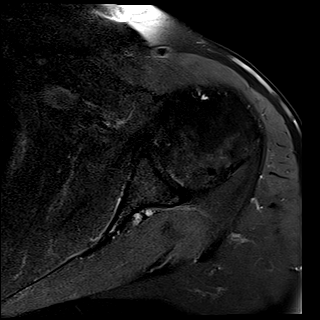
[im 17/26]
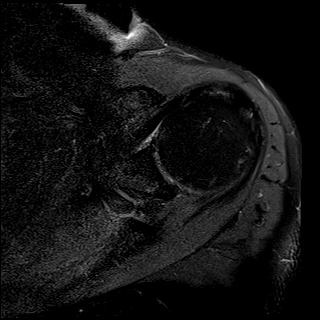
[im 21/26]
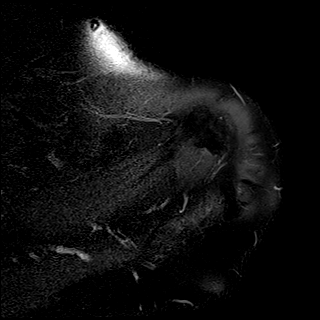
[im 26/26]
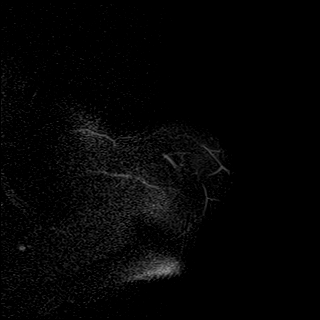

[Series 6: T2 fat-sat · oblique · left · 4.0mm · 0.47mm/px · 7 of 26 slices shown (2 of 3)]
[im 1/26]
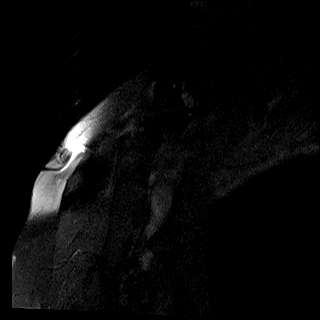
[im 5/26]
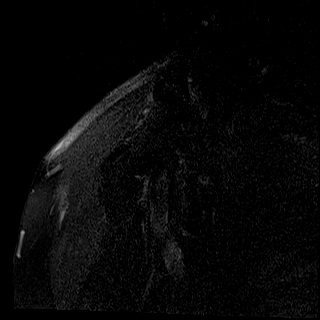
[im 9/26]
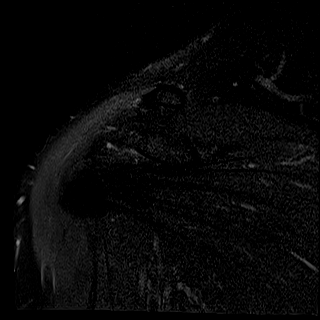
[im 13/26]
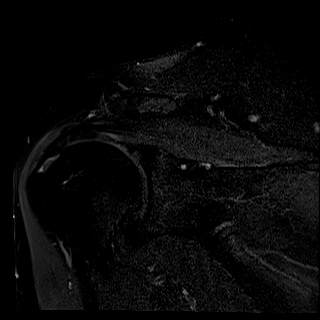
[im 17/26]
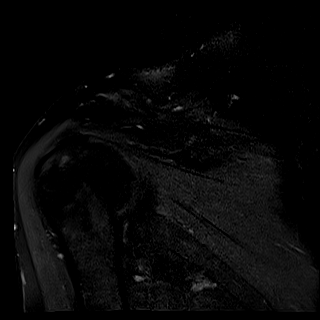
[im 21/26]
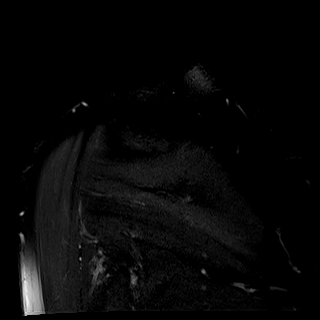
[im 26/26]
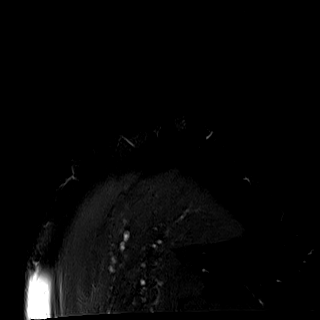

[Series 7: PD · oblique · left · 4.0mm · 0.47mm/px · 7 of 26 slices shown (1 of 2)]
[im 1/26]
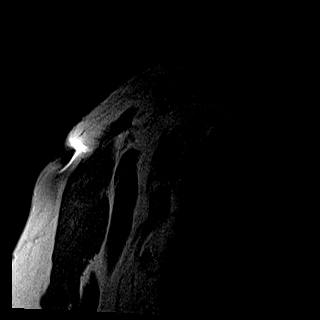
[im 5/26]
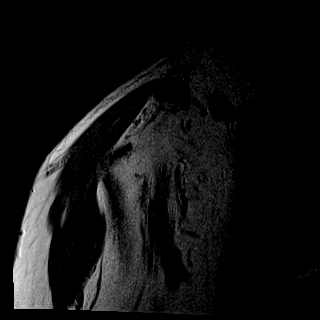
[im 9/26]
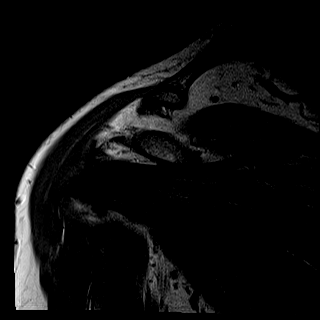
[im 13/26]
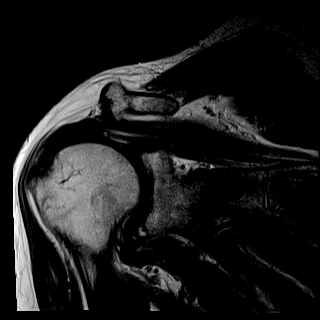
[im 17/26]
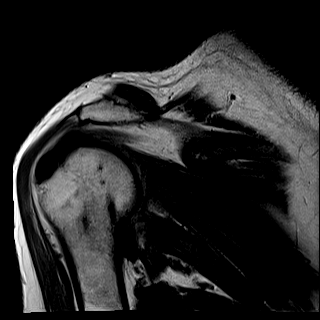
[im 21/26]
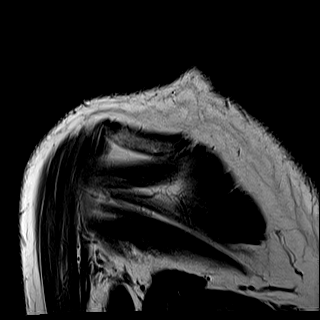
[im 26/26]
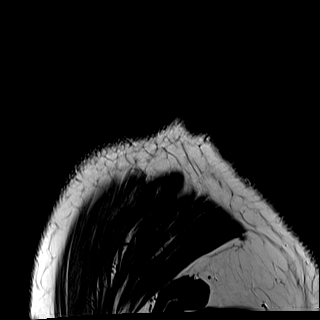

[Series 8: T2 fat-sat · oblique · left · 4.0mm · 0.44mm/px · 6 of 25 slices shown (3 of 3)]
[im 1/25]
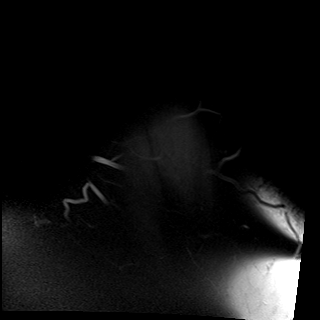
[im 5/25]
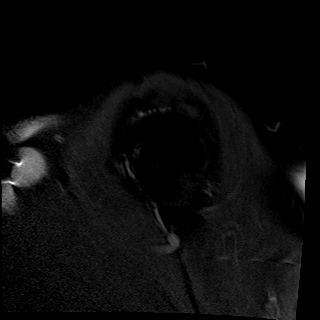
[im 10/25]
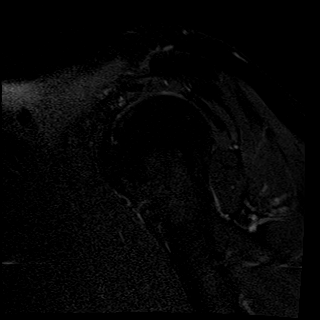
[im 15/25]
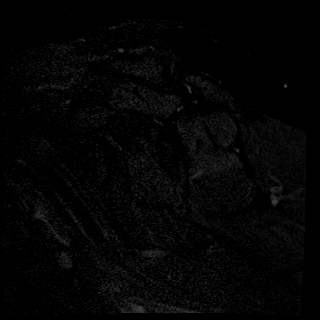
[im 20/25]
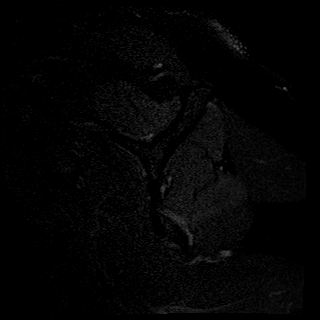
[im 25/25]
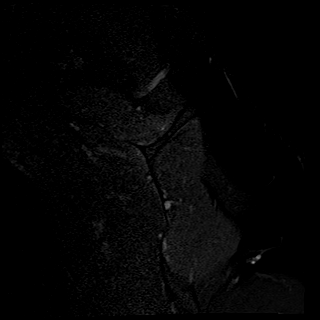

[Series 9: T1 · oblique · left · 4.0mm · 0.41mm/px · 6 of 25 slices shown]
[im 1/25]
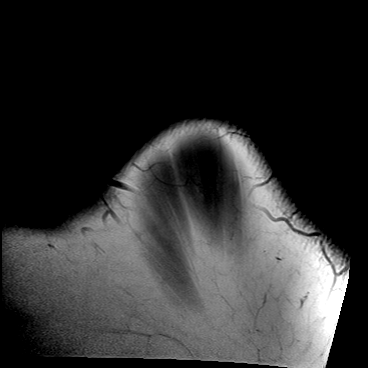
[im 5/25]
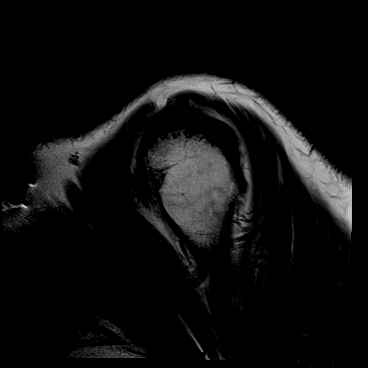
[im 10/25]
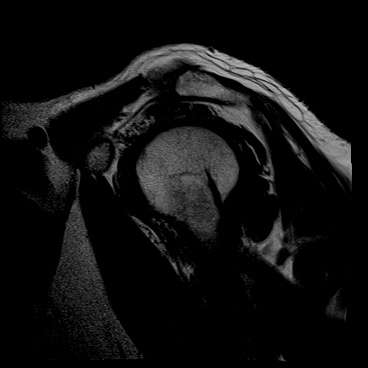
[im 15/25]
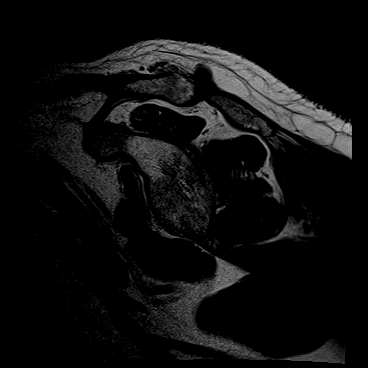
[im 20/25]
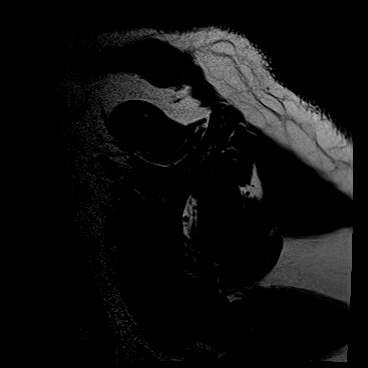
[im 25/25]
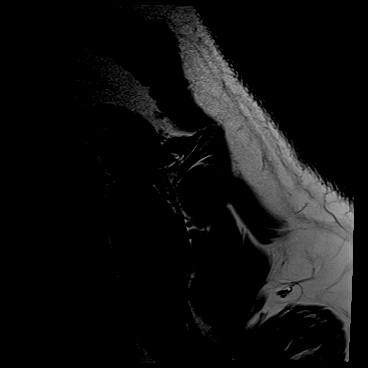

[Series 10: PD · oblique · left · 4.0mm · 0.47mm/px · 7 of 26 slices shown (2 of 2)]
[im 1/26]
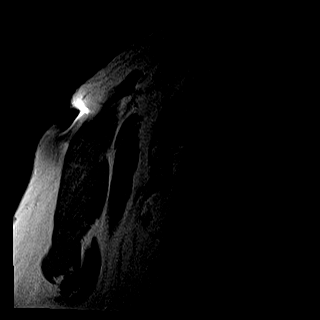
[im 5/26]
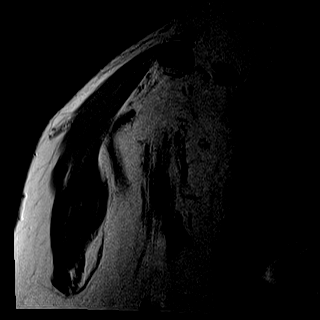
[im 9/26]
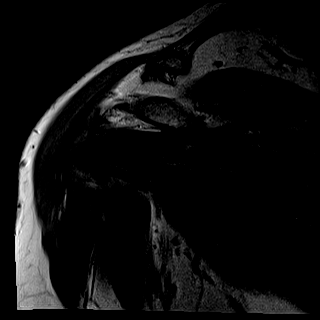
[im 13/26]
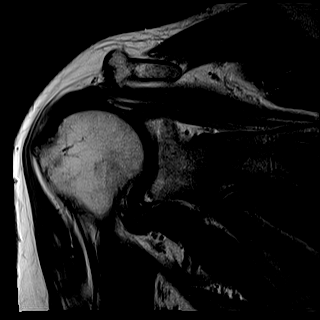
[im 17/26]
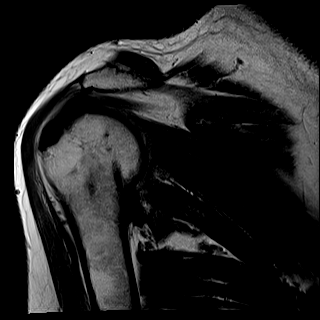
[im 21/26]
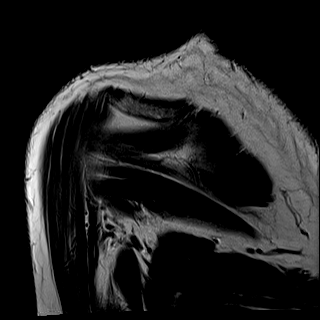
[im 26/26]
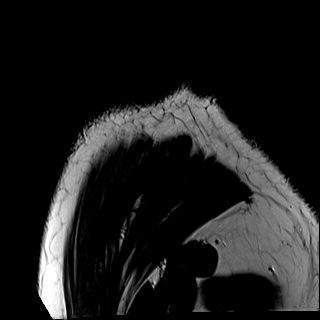

[40 of 40 positions shown; findings below may reference images not displayed]

FINDINGS: Rotator cuff: Mild supraspinatus tendinosis with small focal
high-grade partial-thickness articular surface tear anteriorly at
the insertion (series 6, image 13). Mild infraspinatus tendinosis.
The teres minor and subscapularis tendons are unremarkable.

Muscles: Low-level diffuse edema involving the supraspinatus and
infraspinatus muscles (series 8, image 19). No muscle atrophy.

Biceps long head:  Intact and normally positioned.

Acromioclavicular Joint: Moderate arthropathy of the
acromioclavicular joint. Type I acromion. No subacromial/subdeltoid
bursal fluid.

Glenohumeral Joint: No joint effusion. No chondral defect.

Labrum: Grossly intact, but evaluation is limited by lack of
intraarticular fluid.

Bones:  No marrow abnormality, fracture or dislocation.

Other: None.
IMPRESSION: 1. Mild supraspinatus tendinosis with small focal high-grade
partial-thickness articular surface tear anteriorly at the
insertion.
2. Low-level diffuse edema involving the supraspinatus and
infraspinatus muscles. Findings are suggestive of acute brachial
neuritis (Parsonage Turner syndrome). No muscle atrophy.
3. Moderate acromioclavicular osteoarthritis.

## 2021-03-19 ENCOUNTER — Other Ambulatory Visit (HOSPITAL_COMMUNITY): Payer: Self-pay | Admitting: Family

## 2021-03-19 DIAGNOSIS — R748 Abnormal levels of other serum enzymes: Secondary | ICD-10-CM

## 2021-04-02 ENCOUNTER — Ambulatory Visit: Payer: Medicare Other | Attending: Neurological Surgery | Admitting: Physical Therapy

## 2021-04-02 ENCOUNTER — Other Ambulatory Visit: Payer: Self-pay

## 2021-04-02 DIAGNOSIS — M542 Cervicalgia: Secondary | ICD-10-CM | POA: Diagnosis not present

## 2021-04-02 DIAGNOSIS — M6281 Muscle weakness (generalized): Secondary | ICD-10-CM | POA: Insufficient documentation

## 2021-04-02 DIAGNOSIS — M25512 Pain in left shoulder: Secondary | ICD-10-CM | POA: Diagnosis present

## 2021-04-02 NOTE — Therapy (Signed)
[facility] [place], [place], [address] Phone: [phone]   Fax:  [phone]  Physical Therapy Treatment  Patient Details  Name: [name] MRN: [id number] Date of Birth: Nov 27, 1955 Referring Provider (PT): Pieter Partridge Dawley DO   Encounter Date: 04/02/2021   PT End of Session - 04/02/21 1347     Visit Number 1    Number of Visits 12    Date for PT Re-Evaluation 05/14/21    PT Start Time 0104    PT Stop Time 0144    PT Time Calculation (min) 40 min    Activity Tolerance Patient tolerated treatment well    Behavior During Therapy [facility] for tasks assessed/performed             Past Medical History:  Diagnosis Date   Anemia    IRON 2005   Anxiety    Arthritis    LOWER LUMBAR STENOSIS , ARTHRITIS HANDS AND SHOULDERS   Asthma    Cancer ([place]) 2005   COLON SURGERY AND CHEMO   Complication of anesthesia    WOKE UP DURING PORT PLACEMENT AND ENDOSCOPY, SLOW TO AWAKEN FROM COLON RESECTION 2010   COPD (chronic obstructive pulmonary disease) ([place])    Dyspnea    WITH EXERTION   GERD (gastroesophageal reflux disease)    Hydrocele, right    Neuropathy    FINGERS   Pneumonia 2016 LAST    X 3 EPISODES IN PAST   Vitamin D deficiency     Past Surgical History:  Procedure Laterality Date   COLON RESECTION  2010   HERNIA REPAIR     HYDROCELE EXCISION Right 12/16/2016   Procedure: HYDROCELECTOMY ADULT RGHT;  Surgeon: Lucas Mallow, MD;  Location: [facility];  Service: Urology;  Laterality: Right;   PORT PLACEMENT     LEFT CHEST   UPPER GI ENDOSCOPY      There were no vitals filed for this visit.   Subjective Assessment - 04/02/21 1351     Subjective COVID-19 screen performed prior to patient entering clinic.  The patient presents to the clinic today with a CC of left sided neck and left shoulder pain that has been ongoing for about 4 to 5 months.  Pain medication helps some to decrease pain  but not much.  He really can't determine anything in particular that cause his pain to increase.  He does state, however, his pain canbe very high in the morning depending on how he slept.  His pain-level is a 6/10 today and can easily rise to a 10/10 at times.    Pertinent History Colon cancer in 2005 (resolved), COPD, hernia repair, Parsonage Turner syndrome, Lumbar stenosis with neurogenic claudication.    Patient Stated Goals Get out of pain.    Currently in Pain? Yes    Pain Score 6     Pain Location Neck   Left shoulder.   Pain Orientation Left    Pain Descriptors / Indicators Aching;Shooting;Sore;Sharp;Numbness    Pain Type Acute pain    Pain Radiating Towards Left shoulder.    Pain Onset More than a month ago    Pain Frequency Constant    Aggravating Factors  See above.    Pain Relieving Factors See above.                [facility] PT Assessment - 04/02/21 0001       Assessment   Medical Diagnosis Left shoulder pain.  Referring Provider (PT) Pieter Partridge Dawley DO    Onset Date/Surgical Date --   4-5 months ago per patient report.     Precautions   Precautions None      Restrictions   Weight Bearing Restrictions No      Balance Screen   Has the patient fallen in the past 6 months No    Has the patient had a decrease in activity level because of a fear of falling?  No    Is the patient reluctant to leave their home because of a fear of falling?  No      Home Environment   Living Environment Private residence      Prior Function   Level of Independence Independent      Posture/Postural Control   Posture/Postural Control Postural limitations    Postural Limitations Rounded Shoulders;Forward head      Deep Tendon Reflexes   DTR Assessment Site Brachioradialis;Triceps;Biceps    Biceps DTR --   Left is 1+ and right is a brisk 2+/4+.   Brachioradialis DTR 2+    Triceps DTR 2+      ROM / Strength   AROM / PROM / Strength AROM;Strength      AROM   Overall AROM  Comments The patient's left shoulder antigravity flexion was performed slowly to approximately 135 at which point it was painful.  ER to 80.  Right active cervical rotation is 70 degrees and left is 55 degrees.      Strength   Overall Strength Comments Left shoulder deltoid strength is 4-/5, ER is 4-/5, IR is 4+/5.  Elbow strength is essentially normal.      Palpation   Palpation comment CC is tender to palpation left of C5-6, scalene/UT region.      Special Tests   Other special tests Minimal pain reproduction wiht left shoulder Impingement testing.                           [place] Adult PT Treatment/Exercise - 04/02/21 0001       Modalities   Modalities Ultrasound      Ultrasound   Ultrasound Location Small soundhead:  Low-level combo to left C-spine region (C5-6).    Ultrasound Parameters @ 1.50 W/CM2 x 8 minutes.    Ultrasound Goals Pain                          PT Long Term Goals - 04/02/21 1514       PT LONG TERM GOAL #1   Title Independent with a HEP.    Time 6    Period Weeks    Status New      PT LONG TERM GOAL #2   Title Active left cervical rotation to 70 degrees.    Time 6    Period Weeks    Status New      PT LONG TERM GOAL #3   Title Improve left shoulder strength to 5/5.    Time 6    Period Weeks    Status New      PT LONG TERM GOAL #4   Title Perform ADL's with pain not > 3-4/10.    Time 6    Period Weeks                   Plan - 04/02/21 1426     Clinical Impression Statement The patient presents to OPPT  with a CC of left sided neck and shoulder pain that he states began about 4-5 months ago.  He was found to have a decreased left biceps reflex compared to right.  He has some loss of active cervical rotation especially to the left and weakness over his left deltoid muscle group.  H ehas some limitation of left shoulder flexion with pain at endrange with a mildly positive left shoulder Impingement sign.   Patient will benefit from skilled physical therapy intervention to address pain and deficits.    Personal Factors and Comorbidities Comorbidity 1;Comorbidity 2;Other    Comorbidities Colon cancer in 2005 (resolved), COPD, hernia repair, Parsonage Turner syndrome, Lumbar stenosis with neurogenic claudication.    Examination-Activity Limitations Other    Examination-Participation Restrictions Other    Stability/Clinical Decision Making Evolving/Moderate complexity    Clinical Decision Making Moderate    Rehab Potential Good    PT Frequency 2x / week    PT Duration 6 weeks    PT Treatment/Interventions ADLs/Self Care Home Management;Cryotherapy;Electrical Stimulation;Ultrasound;Traction;Moist Heat;Therapeutic activities;Therapeutic exercise;Manual techniques;Patient/family education;Passive range of motion;Dry needling    PT Next Visit Plan Left shoulder RW4, chin tucks and cervical extension, postural exercises, try manual traction and if good response may consider intermittment cervical traction beginning at 12#.    Consulted and Agree with Plan of Care Patient             Patient will benefit from skilled therapeutic intervention in order to improve the following deficits and impairments:  Pain, Decreased activity tolerance, Postural dysfunction, Increased muscle spasms, Decreased strength, Decreased range of motion  Visit Diagnosis: Cervicalgia - Plan: PT plan of care cert/re-cert  Acute pain of left shoulder - Plan: PT plan of care cert/re-cert  Muscle weakness (generalized) - Plan: PT plan of care cert/re-cert     Problem List Patient Active Problem List   Diagnosis Date Noted   COPD with acute exacerbation ([place]) 01/15/2021   Obesity, Class III, BMI 40-49.9 (morbid obesity) ([place]) 01/15/2021   Acute respiratory failure with hypoxia ([place]) 01/12/2021   COPD (chronic obstructive pulmonary disease) ([place]) 01/12/2021   GERD (gastroesophageal reflux disease) 01/12/2021   Chronic  pain 01/12/2021   Dyspnea 03/20/2017   Ventral hernia 03/20/2017   Cancer ([place]) 2005    Jaquari Reckner, [place], PT 04/02/2021, 3:21 PM  [facility] [address] [place], [place], [address] Phone: [phone]   Fax:  [phone]  Name: [name] MRN: [id number] Date of Birth: [birth year]

## 2021-04-05 ENCOUNTER — Encounter: Payer: Self-pay | Admitting: Physical Therapy

## 2021-04-05 ENCOUNTER — Other Ambulatory Visit: Payer: Self-pay

## 2021-04-05 ENCOUNTER — Ambulatory Visit: Payer: Medicare Other | Admitting: Physical Therapy

## 2021-04-05 DIAGNOSIS — M542 Cervicalgia: Secondary | ICD-10-CM | POA: Diagnosis not present

## 2021-04-05 DIAGNOSIS — M25512 Pain in left shoulder: Secondary | ICD-10-CM

## 2021-04-05 DIAGNOSIS — M6281 Muscle weakness (generalized): Secondary | ICD-10-CM

## 2021-04-05 NOTE — Therapy (Signed)
[place] ?[facility] ?[place] ?[place], [place], [address] ?Phone: [phone]   Fax:  [phone] ? ?Physical Therapy Treatment ? ?Patient Details  ?Name: [name] ?MRN: [id number] ?Date of Birth: Jan 31, 1955 ?Referring Provider (PT): Pieter Partridge Dawley DO ? ? ?Encounter Date: 04/05/2021 ? ? PT End of Session - 04/05/21 1306   ? ? Visit Number 2   ? Number of Visits 12   ? Date for PT Re-Evaluation 05/14/21   ? PT Start Time 1302   ? PT Stop Time 0998   ? PT Time Calculation (min) 41 min   ? Activity Tolerance Patient tolerated treatment well   ? Behavior During Therapy [facility] for tasks assessed/performed   ? ?  ?  ? ?  ? ? ?Past Medical History:  ?Diagnosis Date  ? Anemia   ? IRON 2005  ? Anxiety   ? Arthritis   ? LOWER LUMBAR STENOSIS , ARTHRITIS HANDS AND SHOULDERS  ? Asthma   ? Cancer [facility]) 2005  ? COLON SURGERY AND CHEMO  ? Complication of anesthesia   ? WOKE UP DURING PORT PLACEMENT AND ENDOSCOPY, SLOW TO AWAKEN FROM COLON RESECTION 2010  ? COPD (chronic obstructive pulmonary disease) ([place])   ? Dyspnea   ? WITH EXERTION  ? GERD (gastroesophageal reflux disease)   ? Hydrocele, right   ? Neuropathy   ? FINGERS  ? Pneumonia 2016 LAST   ? X 3 EPISODES IN PAST  ? Vitamin D deficiency   ? ? ?Past Surgical History:  ?Procedure Laterality Date  ? COLON RESECTION  2010  ? HERNIA REPAIR    ? HYDROCELE EXCISION Right 12/16/2016  ? Procedure: HYDROCELECTOMY ADULT RGHT;  Surgeon: Lucas Mallow, MD;  Location: [facility];  Service: Urology;  Laterality: Right;  ? PORT PLACEMENT    ? LEFT CHEST  ? UPPER GI ENDOSCOPY    ? ? ?There were no vitals filed for this visit. ? ? Subjective Assessment - 04/05/21 1303   ? ? Subjective reports that he is not taking any medication at this time. Had some relief for a few days after evaluation. No problems with ROM but just pain.   ? Pertinent History Colon cancer in 2005 (resolved), COPD, hernia repair, Parsonage Turner syndrome,  Lumbar stenosis with neurogenic claudication.   ? Patient Stated Goals Get out of pain.   ? Currently in Pain? Yes   ? Pain Score 5    ? Pain Location Neck   ? Pain Orientation Left;Lateral;Proximal   ? Pain Descriptors / Indicators Discomfort   ? Pain Type Acute pain   ? Pain Onset More than a month ago   ? Pain Frequency Constant   ? ?  ?  ? ?  ? ? ? ? ? [facility] PT Assessment - 04/05/21 0001   ? ?  ? Assessment  ? Medical Diagnosis Left shoulder pain.   ? Referring Provider (PT) Pieter Partridge Dawley DO   ? Next MD Visit TBD   ?  ? Precautions  ? Precautions None   ?  ? Restrictions  ? Weight Bearing Restrictions No   ? ?  ?  ? ?  ? ? ? ? ? ? ? ? ? ? ? ? ? ? ? ? [place] Adult PT Treatment/Exercise - 04/05/21 0001   ? ?  ? Exercises  ? Exercises Shoulder;Neck   ?  ? Neck Exercises: Machines for Strengthening  ? UBE (Upper Arm Bike) 120  RPM x6 min (forward/backward)   ?  ? Shoulder Exercises: Standing  ? Flexion Strengthening;Both;20 reps;Limitations   ? Flexion Limitations green theraband   ?  ? Shoulder Exercises: Pulleys  ? Flexion 5 minutes   ?  ? Modalities  ? Modalities Electrical Stimulation;Ultrasound   ?  ? Electrical Stimulation  ? Electrical Stimulation Location L UT   ? Electrical Stimulation Action Pre-Mod   ? Electrical Stimulation Parameters 80-150 hz x10 min   ? Electrical Stimulation Goals Pain   ?  ? Ultrasound  ? Ultrasound Location L UT   ? Ultrasound Parameters Combo 1.5 w/cm2, 100%, 1 mhz x10 min   ? Ultrasound Goals Pain   ? ?  ?  ? ?  ? ? ? ? ? ? ? ? ? ? ? ? ? ? ? PT Long Term Goals - 04/02/21 1514   ? ?  ? PT LONG TERM GOAL #1  ? Title Independent with a HEP.   ? Time 6   ? Period Weeks   ? Status New   ?  ? PT LONG TERM GOAL #2  ? Title Active left cervical rotation to 70 degrees.   ? Time 6   ? Period Weeks   ? Status New   ?  ? PT LONG TERM GOAL #3  ? Title Improve left shoulder strength to 5/5.   ? Time 6   ? Period Weeks   ? Status New   ?  ? PT LONG TERM GOAL #4  ? Title Perform ADL's with pain not >  3-4/10.   ? Time 6   ? Period Weeks   ? ?  ?  ? ?  ? ? ? ? ? ? ? ? Plan - 04/05/21 1344   ? ? Clinical Impression Statement Patient presented in clinic with reports of moderate neck pain today which is considered tolerable to him. Patient guided through attempts of low level shoulder and postural strengthening and ROM exercises. Patient overall limited via neck pain and fatigue. [place] and stimulation provided to L UT due to pain in location.   ? Personal Factors and Comorbidities Comorbidity 1;Comorbidity 2;Other   ? Comorbidities Colon cancer in 2005 (resolved), COPD, hernia repair, Parsonage Turner syndrome, Lumbar stenosis with neurogenic claudication.   ? Examination-Activity Limitations Other   ? Examination-Participation Restrictions Other   ? Stability/Clinical Decision Making Evolving/Moderate complexity   ? Rehab Potential Good   ? PT Frequency 2x / week   ? PT Duration 6 weeks   ? PT Treatment/Interventions ADLs/Self Care Home Management;Cryotherapy;Electrical Stimulation;Ultrasound;Traction;Moist Heat;Therapeutic activities;Therapeutic exercise;Manual techniques;Patient/family education;Passive range of motion;Dry needling   ? PT Next Visit Plan Left shoulder RW4, chin tucks and cervical extension, postural exercises, try manual traction and if good response may consider intermittment cervical traction beginning at 12#.   ? Consulted and Agree with Plan of Care Patient   ? ?  ?  ? ?  ? ? ?Patient will benefit from skilled therapeutic intervention in order to improve the following deficits and impairments:  Pain, Decreased activity tolerance, Postural dysfunction, Increased muscle spasms, Decreased strength, Decreased range of motion ? ?Visit Diagnosis: ?Cervicalgia ? ?Acute pain of left shoulder ? ?Muscle weakness (generalized) ? ? ? ? ?Problem List ?Patient Active Problem List  ? Diagnosis Date Noted  ? COPD with acute exacerbation ([place]) 01/15/2021  ? Obesity, Class III, BMI 40-49.9 (morbid obesity) ([place])  01/15/2021  ? Acute respiratory failure with hypoxia ([place]) 01/12/2021  ? COPD (  chronic obstructive pulmonary disease) ([place]) 01/12/2021  ? GERD (gastroesophageal reflux disease) 01/12/2021  ? Chronic pain 01/12/2021  ? Dyspnea 03/20/2017  ? Ventral hernia 03/20/2017  ? Cancer [facility]) 2005  ? ? ?Standley Brooking, PTA ?04/05/2021, 1:53 PM ? ?[place] ?[facility] ?[place] ?[place], [place], [address] ?Phone: [phone]   Fax:  [phone] ? ?Name: KAYMEN ADRIAN ?MRN: [id number] ?Date of Birth: 1955-04-21 ? ? ? ?

## 2021-04-09 ENCOUNTER — Ambulatory Visit: Payer: Medicare Other | Admitting: Physical Therapy

## 2021-04-12 ENCOUNTER — Other Ambulatory Visit: Payer: Self-pay

## 2021-04-12 ENCOUNTER — Ambulatory Visit: Payer: Medicare Other | Admitting: Physical Therapy

## 2021-04-12 DIAGNOSIS — M542 Cervicalgia: Secondary | ICD-10-CM | POA: Diagnosis not present

## 2021-04-12 NOTE — Therapy (Signed)
[place] ?[facility] ?[place] ?[place], [place], [address] ?Phone: [phone]   Fax:  [phone] ? ?Physical Therapy Treatment ? ?Patient Details  ?Name: [name] ?MRN: [id number] ?Date of Birth: [birth year] ?Referring Provider (PT): Pieter Partridge Dawley DO ? ? ?Encounter Date: 04/12/2021 ? ? PT End of Session - 04/12/21 1343   ? ? Visit Number 3   ? Number of Visits 12   ? Date for PT Re-Evaluation 05/14/21   ? PT Start Time 1259   ? PT Stop Time 1329   ? PT Time Calculation (min) 30 min   ? Activity Tolerance Patient tolerated treatment well   ? Behavior During Therapy [facility] for tasks assessed/performed   ? ?  ?  ? ?  ? ? ?Past Medical History:  ?Diagnosis Date  ? Anemia   ? IRON 2005  ? Anxiety   ? Arthritis   ? LOWER LUMBAR STENOSIS , ARTHRITIS HANDS AND SHOULDERS  ? Asthma   ? Cancer [facility]) 2005  ? COLON SURGERY AND CHEMO  ? Complication of anesthesia   ? WOKE UP DURING PORT PLACEMENT AND ENDOSCOPY, SLOW TO AWAKEN FROM COLON RESECTION 2010  ? COPD (chronic obstructive pulmonary disease) ([place])   ? Dyspnea   ? WITH EXERTION  ? GERD (gastroesophageal reflux disease)   ? Hydrocele, right   ? Neuropathy   ? FINGERS  ? Pneumonia 2016 LAST   ? X 3 EPISODES IN PAST  ? Vitamin D deficiency   ? ? ?Past Surgical History:  ?Procedure Laterality Date  ? COLON RESECTION  2010  ? HERNIA REPAIR    ? HYDROCELE EXCISION Right 12/16/2016  ? Procedure: HYDROCELECTOMY ADULT RGHT;  Surgeon: Lucas Mallow, MD;  Location: [facility];  Service: Urology;  Laterality: Right;  ? PORT PLACEMENT    ? LEFT CHEST  ? UPPER GI ENDOSCOPY    ? ? ?There were no vitals filed for this visit. ? ? Subjective Assessment - 04/12/21 1337   ? ? Subjective COVID-19 screen performed prior to patient entering clinic.  In a lot of pain after last treatment.  Pain at a 5/10 today.   ? Pertinent History Colon cancer in 2005 (resolved), COPD, hernia repair, Parsonage Turner syndrome, Lumbar stenosis with  neurogenic claudication.   ? Patient Stated Goals Get out of pain.   ? Pain Score 5    ? Pain Location Neck   ? Pain Orientation Left   ? Pain Onset More than a month ago   ? ?  ?  ? ?  ? ? ? ? ? ? ? ? ? ? ? ? ? ? ? ? ? ? ? ? [place] Adult PT Treatment/Exercise - 04/12/21 0001   ? ?  ? Modalities  ? Modalities Ultrasound   ?  ? Ultrasound  ? Ultrasound Location Left UT   ? Ultrasound Parameters U/S at 1.50 W/CM2 x 12 minutes.   ?  ? Manual Therapy  ? Manual Therapy Soft tissue mobilization   ? Manual therapy comments STW/M x 12 minutes to patient's left cervical musculature with ischemic release technique to his left UT and Levator Scapulae.   ? ?  ?  ? ?  ? ? ? ? ? ? ? ? ? ? ? ? ? ? ? PT Long Term Goals - 04/02/21 1514   ? ?  ? PT LONG TERM GOAL #1  ? Title Independent with a HEP.   ? Time  6   ? Period Weeks   ? Status New   ?  ? PT LONG TERM GOAL #2  ? Title Active left cervical rotation to 70 degrees.   ? Time 6   ? Period Weeks   ? Status New   ?  ? PT LONG TERM GOAL #3  ? Title Improve left shoulder strength to 5/5.   ? Time 6   ? Period Weeks   ? Status New   ?  ? PT LONG TERM GOAL #4  ? Title Perform ADL's with pain not > 3-4/10.   ? Time 6   ? Period Weeks   ? ?  ?  ? ?  ? ? ? ? ? ? ? ? Plan - 04/12/21 1340   ? ? Clinical Impression Statement The patient did well today.  More conservative treament today due to increased pain after last treatment.  He responded very well to STW/M today.   ? Comorbidities Colon cancer in 2005 (resolved), COPD, hernia repair, Parsonage Turner syndrome, Lumbar stenosis with neurogenic claudication.   ? Examination-Participation Restrictions Other   ? Stability/Clinical Decision Making Evolving/Moderate complexity   ? Rehab Potential Good   ? PT Frequency 2x / week   ? PT Duration 6 weeks   ? PT Treatment/Interventions ADLs/Self Care Home Management;Cryotherapy;Electrical Stimulation;Ultrasound;Traction;Moist Heat;Therapeutic activities;Therapeutic exercise;Manual  techniques;Patient/family education;Passive range of motion;Dry needling   ? PT Next Visit Plan Try manual traction next visit, if pain reduces begin intermittment cervical traction at 12#.   ? Consulted and Agree with Plan of Care Patient   ? ?  ?  ? ?  ? ? ?Patient will benefit from skilled therapeutic intervention in order to improve the following deficits and impairments:  Pain, Decreased activity tolerance, Postural dysfunction, Increased muscle spasms, Decreased strength, Decreased range of motion ? ?Visit Diagnosis: ?Cervicalgia ? ?Acute pain of left shoulder ? ?Muscle weakness (generalized) ? ? ? ? ?Problem List ?Patient Active Problem List  ? Diagnosis Date Noted  ? COPD with acute exacerbation ([place]) 01/15/2021  ? Obesity, Class III, BMI 40-49.9 (morbid obesity) ([place]) 01/15/2021  ? Acute respiratory failure with hypoxia ([place]) 01/12/2021  ? COPD (chronic obstructive pulmonary disease) ([place]) 01/12/2021  ? GERD (gastroesophageal reflux disease) 01/12/2021  ? Chronic pain 01/12/2021  ? Dyspnea 03/20/2017  ? Ventral hernia 03/20/2017  ? Cancer [facility]) 2005  ? ? ?Aldina Porta, [place], PT ?04/12/2021, 1:44 PM ? ?[place] ?[facility] ?[place] ?[place], [place], [address] ?Phone: [phone]   Fax:  [phone] ? ?Name: [name] ?MRN: [id number] ?Date of Birth: January 20, 1956 ? ? ? ?

## 2021-04-16 ENCOUNTER — Ambulatory Visit: Payer: Medicare Other

## 2021-04-16 ENCOUNTER — Telehealth: Payer: Self-pay | Admitting: Physical Therapy

## 2021-04-16 NOTE — Telephone Encounter (Signed)
He is in too much pain and will not be here today;  see you all Thursday ?

## 2021-04-19 ENCOUNTER — Ambulatory Visit: Payer: Medicare Other | Admitting: Physical Therapy

## 2021-04-19 ENCOUNTER — Other Ambulatory Visit: Payer: Self-pay

## 2021-04-19 NOTE — Therapy (Signed)
[place] ?[facility] ?[place] ?[place], [place], [address] ?Phone: [phone]   Fax:  [phone] ? ?Patient Details  ?Name: [name] ?MRN: [id number] ?Date of Birth: 1955/12/21 ?Referring Provider:  Drue Second I* ? ?Encounter Date: 04/19/2021 ? ?Patient presented to the clinic with no improvement.  Will discharge at this time. ? ?Thelia Tanksley, [place], PT ?04/19/2021, 2:14 PM ? ?[place] ?[facility] ?[place] ?[place], [place], [address] ?Phone: [phone]   Fax:  [phone] ?

## 2021-08-13 ENCOUNTER — Encounter: Payer: Self-pay | Admitting: *Deleted

## 2021-09-10 ENCOUNTER — Other Ambulatory Visit: Payer: Self-pay | Admitting: *Deleted

## 2021-09-10 DIAGNOSIS — K439 Ventral hernia without obstruction or gangrene: Secondary | ICD-10-CM

## 2021-09-11 ENCOUNTER — Ambulatory Visit: Payer: Medicare HMO | Admitting: Surgery

## 2021-09-11 ENCOUNTER — Encounter: Payer: Self-pay | Admitting: Surgery

## 2021-09-11 VITALS — BP 152/85 | HR 85 | Temp 98.4°F | Resp 18 | Ht 69.0 in | Wt 245.0 lb

## 2021-09-11 DIAGNOSIS — K436 Other and unspecified ventral hernia with obstruction, without gangrene: Secondary | ICD-10-CM | POA: Diagnosis not present

## 2021-09-11 NOTE — Progress Notes (Unsigned)
[facility] History and Physical  Reason for Referral:*** Referring Physician: ***  Chief Complaint   New Patient (Initial Visit)     [name] is a 66 y.o. male.  HPI: ***.  The *** started *** and has had a duration of ***.  It is associated with ***.  The *** is improved with ***, and is made worse with ***.    Quality*** Context***  Past Medical History:  Diagnosis Date  . Anemia    IRON 2005  . Anxiety   . Arthritis    LOWER LUMBAR STENOSIS , ARTHRITIS HANDS AND SHOULDERS  . Asthma   . Cancer ([place]) 2005   COLON SURGERY AND CHEMO  . Complication of anesthesia    WOKE UP DURING PORT PLACEMENT AND ENDOSCOPY, SLOW TO AWAKEN FROM COLON RESECTION 2010  . COPD (chronic obstructive pulmonary disease) ([place])   . Dyspnea    WITH EXERTION  . GERD (gastroesophageal reflux disease)   . Hydrocele, right   . Neuropathy    FINGERS  . Parsonage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   . Pneumonia 2016 LAST    X 3 EPISODES IN PAST  . Vitamin D deficiency     Past Surgical History:  Procedure Laterality Date  . COLON RESECTION  2010  . HERNIA REPAIR    . HYDROCELE EXCISION Right 12/16/2016   Procedure: HYDROCELECTOMY ADULT RGHT;  Surgeon: Lucas Mallow, MD;  Location: [facility];  Service: Urology;  Laterality: Right;  . PORT PLACEMENT     LEFT CHEST  . UPPER GI ENDOSCOPY      No family history on file.  Social History   Tobacco Use  . Smoking status: Every Day    Packs/day: 1.00    Years: 45.00    Total pack years: 45.00    Types: Cigarettes  . Smokeless tobacco: Never  Vaping Use  . Vaping Use: Never used  Substance Use Topics  . Alcohol use: No  . Drug use: No    Medications: {medication reviewed/display:3041432} Allergies as of 09/11/2021   No Known Allergies      Medication List        Accurate as of September 11, 2021  9:18 AM. If you have any questions, ask your nurse or doctor.          STOP taking these medications     aspirin EC 81 MG tablet Stopped by: Carleta Woodrow A Zyiah Withington, DO   atorvastatin 20 MG tablet Commonly known as: LIPITOR Stopped by: Jerlisa Diliberto A Sheza Strickland, DO   gabapentin 300 MG capsule Commonly known as: NEURONTIN Stopped by: Yaxiel Minnie A Ladoris Lythgoe, DO   guaiFENesin 600 MG 12 hr tablet Commonly known as: MUCINEX Stopped by: Karley Pho A Zenita Kister, DO   multivitamin with minerals Tabs tablet Stopped by: Buel Molder A Rubylee Zamarripa, DO   nicotine 21 mg/24hr patch Commonly known as: NICODERM CQ - dosed in mg/24 hours Stopped by: Breleigh Carpino A Reese Senk, DO   predniSONE 10 MG tablet Commonly known as: DELTASONE Stopped by: Quentyn Kolbeck A Ahliya Glatt, DO       TAKE these medications    albuterol 108 (90 Base) MCG/ACT inhaler Commonly known as: VENTOLIN HFA Inhale 2 puffs into the lungs every 6 (six) hours as needed for wheezing or shortness of breath.   albuterol (2.5 MG/3ML) 0.083% nebulizer solution Commonly known as: PROVENTIL Take 3 mLs (2.5 mg total) by nebulization every 4 (four) hours as needed for wheezing or shortness of breath.   bumetanide 1 MG tablet  Commonly known as: BUMEX Take 1 tablet (1 mg total) by mouth 2 (two) times daily.   calcium carbonate 500 MG chewable tablet Commonly known as: TUMS - dosed in mg elemental calcium Chew 1 tablet as needed by mouth for indigestion or heartburn.   chlorthalidone 25 MG tablet Commonly known as: HYGROTON Take 25 mg by mouth daily.   DULoxetine 30 MG capsule Commonly known as: CYMBALTA Take 30 mg by mouth daily.   ipratropium-albuterol 0.5-2.5 (3) MG/3ML Soln Commonly known as: DUONEB Take 3 mLs by nebulization every 6 (six) hours as needed (for shortness of breath).   Linzess 290 MCG Caps capsule Generic drug: linaclotide Take 290 mcg by mouth as needed.   oxyCODONE-acetaminophen 5-325 MG tablet Commonly known as: PERCOCET/ROXICET Take 1 tablet by mouth every 6 (six) hours as needed for severe pain or  moderate pain.   potassium chloride 10 MEQ tablet Commonly known as: KLOR-CON Take 1 tablet (10 mEq total) by mouth daily. Take While taking Bumex (Fluid Medicine)   tamsulosin 0.4 MG Caps capsule Commonly known as: FLOMAX Take 1 capsule (0.4 mg total) by mouth daily after supper.   Trelegy Ellipta 100-62.5-25 MCG/ACT Aepb Generic drug: Fluticasone-Umeclidin-Vilant Inhale 1 puff into the lungs daily.         ROS:  {Review of Systems:30496}  Blood pressure (!) 152/85, pulse 85, temperature 98.4 F (36.9 C), temperature source Oral, resp. rate 18, height '5\' 9"'$  (1.753 m), weight 245 lb (111.1 kg), SpO2 90 %. Physical Exam  Results: No results found for this or any previous visit (from the past 48 hour(s)).  No results found.   Assessment & Plan:  [name] is a 66 y.o. male with *** -*** -*** -Follow up ***  All questions were answered to the satisfaction of the patient and family***.  The risk and benefits of *** were discussed including but not limited to ***.  After careful consideration, [name] has decided to ***.    Cortana Vanderford A Danylle Ouk 09/11/2021, 9:18 AM   Graciella Freer, DO [facility] [address] [place], [place] [address] [phone] (office)

## 2021-09-11 NOTE — Patient Instructions (Signed)
-  [facility] will call you to make an appointment

## 2021-09-12 ENCOUNTER — Other Ambulatory Visit: Payer: Self-pay | Admitting: *Deleted

## 2021-09-12 DIAGNOSIS — K439 Ventral hernia without obstruction or gangrene: Secondary | ICD-10-CM

## 2021-12-11 ENCOUNTER — Encounter (INDEPENDENT_AMBULATORY_CARE_PROVIDER_SITE_OTHER): Payer: Self-pay | Admitting: *Deleted

## 2023-07-05 ENCOUNTER — Other Ambulatory Visit: Payer: Self-pay

## 2023-07-05 ENCOUNTER — Encounter (HOSPITAL_BASED_OUTPATIENT_CLINIC_OR_DEPARTMENT_OTHER): Payer: Self-pay | Admitting: *Deleted

## 2023-07-05 ENCOUNTER — Other Ambulatory Visit (HOSPITAL_BASED_OUTPATIENT_CLINIC_OR_DEPARTMENT_OTHER): Payer: Self-pay

## 2023-07-05 ENCOUNTER — Emergency Department (HOSPITAL_BASED_OUTPATIENT_CLINIC_OR_DEPARTMENT_OTHER)
Admission: EM | Admit: 2023-07-05 | Discharge: 2023-07-05 | Disposition: A | Discharge status: Home / Self Care | Attending: Emergency Medicine | Admitting: Emergency Medicine

## 2023-07-05 DIAGNOSIS — J029 Acute pharyngitis, unspecified: Secondary | ICD-10-CM | POA: Insufficient documentation

## 2023-07-05 DIAGNOSIS — Z87891 Personal history of nicotine dependence: Secondary | ICD-10-CM | POA: Diagnosis not present

## 2023-07-05 DIAGNOSIS — J3489 Other specified disorders of nose and nasal sinuses: Secondary | ICD-10-CM | POA: Diagnosis not present

## 2023-07-05 DIAGNOSIS — Z85038 Personal history of other malignant neoplasm of large intestine: Secondary | ICD-10-CM | POA: Diagnosis not present

## 2023-07-05 LAB — RESP PANEL BY RT-PCR (RSV, FLU A&B, COVID)  RVPGX2
Influenza A by PCR: NEGATIVE
Influenza B by PCR: NEGATIVE
Resp Syncytial Virus by PCR: NEGATIVE
SARS Coronavirus 2 by RT PCR: NEGATIVE

## 2023-07-05 LAB — GROUP A STREP BY PCR: Group A Strep by PCR: NOT DETECTED

## 2023-07-05 MED ORDER — LIDOCAINE VISCOUS HCL 2 % MT SOLN
15.0000 mL | Freq: Four times a day (QID) | OROMUCOSAL | 0 refills | Status: DC | PRN
  Filled 2023-07-05: qty 100, 2d supply, fill #0

## 2023-07-05 MED ORDER — DEXAMETHASONE SODIUM PHOSPHATE 10 MG/ML IJ SOLN: 10.0000 mg | Freq: Once | INTRAMUSCULAR | Status: DC

## 2023-07-05 MED ORDER — LIDOCAINE VISCOUS HCL 2 % MT SOLN
15.0000 mL | Freq: Once | OROMUCOSAL | Status: AC
  Administered 2023-07-05: 15 mL via OROMUCOSAL
  Filled 2023-07-05: qty 15

## 2023-07-05 MED ORDER — ALBUTEROL SULFATE HFA 108 (90 BASE) MCG/ACT IN AERS
2.0000 | INHALATION_SPRAY | RESPIRATORY_TRACT | 4 refills | Status: AC | PRN
  Filled 2023-07-07: qty 18, 30d supply, fill #0
  Filled 2023-07-09: qty 6.7, 25d supply, fill #0
  Filled 2023-08-05: qty 20.1, 75d supply, fill #1
  Filled 2023-08-06: qty 20.1, 75d supply, fill #2
  Filled 2023-08-06: qty 6.7, 25d supply, fill #1

## 2023-07-05 MED ORDER — CLINDAMYCIN HCL 300 MG PO CAPS
300.0000 mg | ORAL_CAPSULE | Freq: Three times a day (TID) | ORAL | 0 refills | Status: AC
  Filled 2023-07-05: qty 30, 10d supply, fill #0

## 2023-07-05 MED ORDER — TAMSULOSIN HCL 0.4 MG PO CAPS: 0.4000 mg | ORAL_CAPSULE | Freq: Every day | ORAL | 1 refills | Status: DC

## 2023-07-05 MED ORDER — CHLORTHALIDONE 25 MG PO TABS: 25.0000 mg | ORAL_TABLET | Freq: Every morning | ORAL | 0 refills | Status: DC

## 2023-07-05 MED ORDER — NYSTATIN-TRIAMCINOLONE 100000-0.1 UNIT/GM-% EX CREA: TOPICAL_CREAM | CUTANEOUS | 2 refills | Status: DC

## 2023-07-05 MED ORDER — IPRATROPIUM-ALBUTEROL 0.5-2.5 (3) MG/3ML IN SOLN: 3.0000 mL | Freq: Four times a day (QID) | RESPIRATORY_TRACT | 1 refills | Status: DC | PRN

## 2023-07-05 MED ORDER — DEXAMETHASONE 4 MG PO TABS
10.0000 mg | ORAL_TABLET | Freq: Once | ORAL | Status: AC
  Administered 2023-07-05: 10 mg via ORAL
  Filled 2023-07-05: qty 3

## 2023-07-05 MED ORDER — TAMSULOSIN HCL 0.4 MG PO CAPS: 0.4000 mg | ORAL_CAPSULE | Freq: Two times a day (BID) | ORAL | 4 refills | Status: AC

## 2023-07-05 MED ORDER — METHYLPREDNISOLONE 4 MG PO TBPK
ORAL_TABLET | ORAL | 0 refills | Status: DC
  Filled 2023-07-05: qty 21, 6d supply, fill #0

## 2023-07-05 NOTE — ED Triage Notes (Signed)
 Patient to ED reporting sore throat x 1 month-1 year  Last night during a big yawn at 2-3 am he felt a "tear" and the pain has been significantly worse since then. No swelling reported but obvious redness and irritation noted in throat.

## 2023-07-05 NOTE — ED Provider Notes (Signed)
 [place] EMERGENCY DEPARTMENT AT [facility] Provider Note   CSN: [id number] Arrival date & time: 07/05/23  1242     History  Chief Complaint  Patient presents with   Sore Throat    [name] is a 68 y.o. male.  Patient here with sore throat.  History of COPD asthma smoking history colon cancer.  Patient been having sore throat intermittently for a long time but worse over the last day or 2.  Felt a lot of discomfort overnight.  Denies any major difficulty eating or drinking.  Denies any difficulty opening his mouth.  No drooling no secretions.  The history is provided by the patient.       Home Medications Prior to Admission medications   Medication Sig Start Date End Date Taking? Authorizing Provider  clindamycin (CLEOCIN) 300 MG capsule Take 1 capsule (300 mg total) by mouth 3 (three) times daily for 10 days. 07/05/23 07/15/23 Yes Azka Steger, DO  methylPREDNISolone  (MEDROL  DOSEPAK) 4 MG TBPK tablet Follow package insert 07/05/23  Yes Nazanin Kinner, DO  albuterol  (PROVENTIL ) (2.5 MG/3ML) 0.083% nebulizer solution Take 3 mLs (2.5 mg total) by nebulization every 4 (four) hours as needed for wheezing or shortness of breath. 01/15/21   Colin Dawley, MD  albuterol  (VENTOLIN  HFA) 108 (90 Base) MCG/ACT inhaler Inhale 2 puffs into the lungs every 6 (six) hours as needed for wheezing or shortness of breath. 01/15/21   Colin Dawley, MD  bumetanide  (BUMEX ) 1 MG tablet Take 1 tablet (1 mg total) by mouth 2 (two) times daily. 01/15/21   Colin Dawley, MD  calcium  carbonate (TUMS - DOSED IN MG ELEMENTAL CALCIUM ) 500 MG chewable tablet Chew 1 tablet as needed by mouth for indigestion or heartburn.    [provider]  chlorthalidone  (HYGROTON ) 25 MG tablet Take 25 mg by mouth daily.    [provider]  DULoxetine (CYMBALTA) 30 MG capsule Take 30 mg by mouth daily.    [provider]  ipratropium-albuterol  (DUONEB) 0.5-2.5 (3) MG/3ML SOLN  Take 3 mLs by nebulization every 6 (six) hours as needed (for shortness of breath). 01/15/21   Colin Dawley, MD  linaclotide (LINZESS) 290 MCG CAPS capsule Take 290 mcg by mouth as needed.    [provider]  oxyCODONE -acetaminophen  (PERCOCET/ROXICET) 5-325 MG tablet Take 1 tablet by mouth every 6 (six) hours as needed for severe pain or moderate pain. 12/29/20   Carlton Chick, MD  potassium chloride  (KLOR-CON ) 10 MEQ tablet Take 1 tablet (10 mEq total) by mouth daily. Take While taking Bumex  (Fluid Medicine) Patient not taking: Reported on 04/02/2021 01/15/21   Colin Dawley, MD  tamsulosin  (FLOMAX ) 0.4 MG CAPS capsule Take 1 capsule (0.4 mg total) by mouth daily after supper. 01/15/21   Emokpae, Courage, MD  TRELEGY ELLIPTA 100-62.5-25 MCG/ACT AEPB Inhale 1 puff into the lungs daily. 08/15/21   [provider]      Allergies    Patient has no known allergies.    Review of Systems   Review of Systems  Physical Exam Updated Vital Signs BP 103/75 (BP Location: Right Arm)   Pulse 96   Temp 97.8 F (36.6 C) (Oral)   Resp 18   SpO2 91%  Physical Exam Vitals and nursing note reviewed.  Constitutional:      General: He is not in acute distress.    Appearance: He is well-developed. He is not ill-appearing.  HENT:     Head: Normocephalic and atraumatic.  Mouth/Throat:     Mouth: Mucous membranes are moist.     Pharynx: Posterior oropharyngeal erythema present. No pharyngeal swelling, oropharyngeal exudate or uvula swelling.     Tonsils: No tonsillar exudate.  Eyes:     Conjunctiva/sclera: Conjunctivae normal.  Cardiovascular:     Rate and Rhythm: Normal rate and regular rhythm.     Heart sounds: Normal heart sounds. No murmur heard. Pulmonary:     Effort: Pulmonary effort is normal. No respiratory distress.     Breath sounds: Normal breath sounds.  Abdominal:     Palpations: Abdomen is soft.     Tenderness: There is no abdominal tenderness.   Musculoskeletal:        General: No swelling.     Cervical back: Normal range of motion and neck supple.  Lymphadenopathy:     Cervical: No cervical adenopathy.  Skin:    General: Skin is warm and dry.     Capillary Refill: Capillary refill takes less than 2 seconds.  Neurological:     Mental Status: He is alert.  Psychiatric:        Mood and Affect: Mood normal.     ED Results / Procedures / Treatments   Labs (all labs ordered are listed, but only abnormal results are displayed) Labs Reviewed  GROUP A STREP BY PCR  RESP PANEL BY RT-PCR (RSV, FLU A&B, COVID)  [id number]    EKG None  Radiology No results found.  Procedures Procedures    Medications Ordered in ED Medications  dexamethasone  (DECADRON ) tablet 10 mg (10 mg Oral Given 07/05/23 1308)    ED Course/ Medical Decision Making/ A&P                                 Medical Decision Making Risk Prescription drug management.   [name] is here with sore throat.  History of smoking COPD asthma reflux.  Differential diagnosis likely viral pharyngitis versus strep pharyngitis versus allergy type process.  I have no concern for anaphylaxis.  He has normal vitals.  No fever.  Heavy smoking history.  Colon cancer history.  Is not having any trouble eating or drinking.  No drooling no trismus no submandibular swelling.  He is got erythema to the posterior oropharynx and hard palate with some ulcerated type lesions.  He is not having any food impaction symptoms or reflux symptoms at this time.  I do think that this is likely related to smoking and may be a viral process or strep process.  Will check for COVID flu RSV strep pharyngitis.  Will give a dose of Decadron .  Overall strep test is negative.  Will treat with clindamycin and Medrol  Dosepak.  Will have him follow-up with ENT and primary care doctor.  Told to return if symptoms worsen.  Overall I suspect a viral pharyngitis at this time.  Could be due to some chronic  smoke exposure as well.  We talked about smoking cessation.  They can follow-up viral panel at home.  Discharge.  This chart was dictated using voice recognition software.  Despite best efforts to proofread,  errors can occur which can change the documentation meaning.         Final Clinical Impression(s) / ED Diagnoses Final diagnoses:  Sore throat    Rx / DC Orders ED Discharge Orders          Ordered    clindamycin (CLEOCIN) 300 MG  capsule  3 times daily        07/05/23 1338    methylPREDNISolone  (MEDROL  DOSEPAK) 4 MG TBPK tablet        07/05/23 1338              Sharnay Cashion, DO 07/05/23 1339

## 2023-07-05 NOTE — Discharge Instructions (Signed)
 Take next dose of steroid tomorrow.  Start antibiotics when you get home.  Follow-up with ENT and your primary care doctor.  Return if symptoms worsen.

## 2023-07-05 NOTE — ED Notes (Signed)
 MD at bedside.

## 2023-07-07 ENCOUNTER — Other Ambulatory Visit (HOSPITAL_BASED_OUTPATIENT_CLINIC_OR_DEPARTMENT_OTHER): Payer: Self-pay

## 2023-07-09 ENCOUNTER — Other Ambulatory Visit (HOSPITAL_BASED_OUTPATIENT_CLINIC_OR_DEPARTMENT_OTHER): Payer: Self-pay

## 2023-07-23 NOTE — Progress Notes (Signed)
 ok

## 2023-08-06 ENCOUNTER — Other Ambulatory Visit: Payer: Self-pay

## 2023-08-06 ENCOUNTER — Other Ambulatory Visit (HOSPITAL_BASED_OUTPATIENT_CLINIC_OR_DEPARTMENT_OTHER): Payer: Self-pay

## 2023-08-13 ENCOUNTER — Ambulatory Visit (INDEPENDENT_AMBULATORY_CARE_PROVIDER_SITE_OTHER): Admitting: Otolaryngology

## 2023-08-13 ENCOUNTER — Encounter (INDEPENDENT_AMBULATORY_CARE_PROVIDER_SITE_OTHER): Payer: Self-pay | Admitting: Otolaryngology

## 2023-08-13 VITALS — BP 130/72 | HR 110 | Ht 69.0 in | Wt 238.0 lb

## 2023-08-13 DIAGNOSIS — F17210 Nicotine dependence, cigarettes, uncomplicated: Secondary | ICD-10-CM | POA: Diagnosis not present

## 2023-08-13 DIAGNOSIS — R490 Dysphonia: Secondary | ICD-10-CM | POA: Diagnosis not present

## 2023-08-13 DIAGNOSIS — B370 Candidal stomatitis: Secondary | ICD-10-CM | POA: Diagnosis not present

## 2023-08-13 DIAGNOSIS — J029 Acute pharyngitis, unspecified: Secondary | ICD-10-CM

## 2023-08-13 DIAGNOSIS — F17200 Nicotine dependence, unspecified, uncomplicated: Secondary | ICD-10-CM

## 2023-08-13 MED ORDER — FLUCONAZOLE 150 MG PO TABS: 150.0000 mg | ORAL_TABLET | Freq: Every day | ORAL | 0 refills | Status: AC

## 2023-08-13 MED ORDER — AMOXICILLIN-POT CLAVULANATE 875-125 MG PO TABS: 1.0000 | ORAL_TABLET | Freq: Two times a day (BID) | ORAL | 0 refills | Status: AC

## 2023-08-13 NOTE — Progress Notes (Unsigned)
 Dear Dr. Renato, Here is my assessment for our mutual patient, Nikos [name]. Thank you for allowing me the opportunity to care for your patient. Please do not hesitate to contact me should you have any other questions. Sincerely, Dr. Eldora Blanch  Otolaryngology Clinic Note Referring provider: Dr. Renato HPI:  [name] is a 68 y.o. male kindly referred by Dr. Renato for evaluation of sore [name]  Initial visit (07/2023): Patient reports: that about 6 weeks ago, he woke up and he reported that he felt something tear in his [name], had some dysphonia and sore [name]. Went to ED few days later, got antibiotics and steroids. Since then, he reports that he continues to have some sore [name] and neck discomfort (under my adams apple) which is helped by oxycodone . Still with some dysphonia. Breathing about the same, no exacerbation. Chronic productive cough (mucus, not discolored).  Patient otherwise denies: - dysphagia, odynophagia, PNA, unintentional weight loss - new shortness of breath, hemoptysis - ear pain, neck masses  ENT Surgery: no Personal or FHx of bleeding dz or anesthesia difficulty: no  GLP-1: no AP/AC: no  Tobacco: current user, 2 packs/day (90 pack year). Alcohol: prior heavy use, quit.  PMHx: COPD, Asthma, GERD, Chronic Pain, Aortic Atherosclerosis  Independent Review of Additional Tests or Records:  Dr. Ruthe (07/05/2023): sore [name], intermittent, worse for last 24-48 hours; no issues otherwise; Dx: likely pharyngitis; Rx: clinda, medrol ; f/u with ENT Labs: CBC and CMP 12/2020: BUN/Cr 31/1.29; WBC 6.1 PMH/Meds/All/SocHx/FamHx/ROS:   Past Medical History:  Diagnosis Date   Anemia    IRON 2005   Anxiety    Arthritis    LOWER LUMBAR STENOSIS , ARTHRITIS HANDS AND SHOULDERS   Asthma    Cancer (HCC) 2005   COLON SURGERY AND CHEMO   Complication of anesthesia    WOKE UP DURING PORT PLACEMENT AND ENDOSCOPY, SLOW TO AWAKEN FROM COLON RESECTION 2010    COPD (chronic obstructive pulmonary disease) (HCC)    Dyspnea    WITH EXERTION   GERD (gastroesophageal reflux disease)    Hydrocele, right    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 No pertinent family history.   Social Connections: Not on file      Current Outpatient Medications:    albuterol  (PROVENTIL ) (2.5 MG/3ML) 0.083% nebulizer solution, Take 3 mLs (2.5 mg total) by nebulization every 4 (four) hours as needed for wheezing or shortness of breath., Disp: 75 mL, Rfl: 12   albuterol  (VENTOLIN  HFA) 108 (90 Base) MCG/ACT inhaler, Inhale 2 puffs into the lungs every 6 (six) hours as needed for wheezing or shortness of breath., Disp: 18 g, Rfl: 2   albuterol  (VENTOLIN  HFA) 108 (90 Base) MCG/ACT inhaler, Inhale 2 puffs into the lungs every 4 (four) hours as needed., Disp: 18 g, Rfl: 4   bumetanide  (BUMEX ) 1 MG tablet, Take 1 tablet (1 mg total) by mouth 2 (two) times daily., Disp: 60 tablet,  Rfl: 2   calcium  carbonate (TUMS - DOSED IN MG ELEMENTAL CALCIUM ) 500 MG chewable tablet, Chew 1 tablet as needed by mouth for indigestion or heartburn., Disp: , Rfl:    chlorthalidone  (HYGROTON ) 25 MG tablet, Take 25 mg by mouth daily., Disp: , Rfl:    chlorthalidone  (HYGROTON ) 25 MG tablet, Take 1 tablet (25 mg total) by mouth every morning with food., Disp: 90 tablet, Rfl: 0   DULoxetine (CYMBALTA) 30 MG capsule, Take 30 mg by mouth daily., Disp: , Rfl:    ipratropium-albuterol  (DUONEB) 0.5-2.5 (3) MG/3ML SOLN, Take 3 mLs by nebulization every 6 (six)  hours as needed (for shortness of breath)., Disp: 360 mL, Rfl: 3   ipratropium-albuterol  (DUONEB) 0.5-2.5 (3) MG/3ML SOLN, Inhale 3 mLs into the lungs every 6 (six) hours as needed., Disp: 360 mL, Rfl: 1   lidocaine  (XYLOCAINE ) 2 % solution, Use as directed 15 mLs in the mouth or [name] every 6 (six) hours as needed for mouth pain., Disp: 100 mL, Rfl: 0   linaclotide (LINZESS) 290 MCG CAPS capsule, Take 290 mcg by mouth as needed., Disp: , Rfl:    methylPREDNISolone  (MEDROL  DOSEPAK) 4 MG TBPK tablet, Follow package insert, Disp: 21 each, Rfl: 0   nystatin -triamcinolone  (MYCOLOG II) cream, Apply to the scrotum twice daily as directed, Disp: 60 g, Rfl: 2   oxyCODONE -acetaminophen  (PERCOCET/ROXICET) 5-325 MG tablet, Take 1 tablet by mouth every 6 (six) hours as needed for severe pain or moderate pain., Disp: 20 tablet, Rfl: 0   potassium chloride  (KLOR-CON ) 10 MEQ tablet, Take 1 tablet (10 mEq total) by mouth daily. Take While taking Bumex  (Fluid Medicine), Disp: 30 tablet, Rfl: 2   predniSONE  (DELTASONE ) 5 MG tablet, Take 5 mg by mouth 3 (three) times daily., Disp: , Rfl:    tamsulosin  (FLOMAX ) 0.4 MG CAPS capsule, Take 1 capsule (0.4 mg total) by mouth daily after supper., Disp: 30 capsule, Rfl: 5   tamsulosin  (FLOMAX ) 0.4 MG CAPS capsule, Take 1 capsule (0.4 mg total) by mouth 2 (two) times daily., Disp: 180 capsule, Rfl: 4   tamsulosin  (FLOMAX ) 0.4 MG CAPS capsule, Take 1 capsule (0.4 mg total) by mouth daily., Disp: 60 capsule, Rfl: 1   TRELEGY ELLIPTA 100-62.5-25 MCG/ACT AEPB, Inhale 1 puff into the lungs daily., Disp: , Rfl:    Physical Exam:   BP 130/72 (BP Location: Left Arm, Patient Position: Sitting, Cuff Size: Large)   Pulse (!) 110   Ht 5' 9 (1.753 m)   Wt 238 lb (108 kg)   SpO2 (!) 89%   BMI 35.15 kg/m   Salient findings:  CN II-XII intact *** Bilateral EAC clear and TM intact with well pneumatized middle ear spaces Weber 512: *** Rinne 512: AC > BC b/l *** Rine 1024: AC > BC  b/l *** Anterior rhinoscopy: Septum ***; bilateral inferior turbinates with *** No lesions of oral cavity/oropharynx; dentition *** No obviously palpable neck masses/lymphadenopathy/thyromegaly No respiratory distress or stridor***  Seprately Identifiable Procedures:  Prior to initiating any procedures, risks/benefits/alternatives were explained to the patient and verbal consent obtained. Procedure Note Pre-procedure diagnosis:  Dysphonia *** Post-procedure diagnosis: Same Procedure: Transnasal Fiberoptic Laryngoscopy, CPT 31575 - Mod 25 Indication: *** Complications: None apparent EBL: 0 mL  The procedure was undertaken to further evaluate the patient's complaint of ***, with mirror exam inadequate for appropriate examination due to gag reflex and poor patient tolerance  Procedure:  Patient was identified as correct patient. Verbal consent was obtained. The nose was sprayed  with oxymetazoline and 4% lidocaine . The The flexible laryngoscope was passed through the nose to view the nasal cavity, pharynx (oropharynx, hypopharynx) and larynx.  The larynx was examined at rest and during multiple phonatory tasks. Documentation was obtained and reviewed with patient. The scope was removed. The patient tolerated the procedure well.  Findings: The nasal cavity and nasopharynx did not reveal any masses or lesions, mucosa appeared to be without obvious lesions. The tongue base, pharyngeal walls, piriform sinuses, vallecula, epiglottis and postcricoid region are normal in appearance EXCEPT: ***. The visualized portion of the subglottis and proximal trachea is widely patent. The vocal folds are mobile bilaterally. There are no lesions on the free edge of the vocal folds nor elsewhere in the larynx worrisome for malignancy.    Electronically signed by: Eldora KATHEE Blanch, MD 08/13/2023 10:22 AM   Impression & Plans:  Enrigue Hashimi is a 68 y.o. male with ***  No diagnosis found.  Some antifungal  changes??  Global erythema If no resolution, will get CT Does use nebulizer and inhaler MTD He's on trelegy Salt water gargles every day - f/u ***  See below regarding exact medications prescribed this encounter including dosages and route: No orders of the defined types were placed in this encounter.     Thank you for allowing me the opportunity to care for your patient. Please do not hesitate to contact me should you have any other questions.  Sincerely, Eldora Blanch, MD Otolaryngologist (ENT), [facility] Phone: [phone] Fax: [phone]  08/13/2023, 10:05 AM   MDM:  Level *** Complexity/Problems addressed: *** Data complexity: *** independent review of *** - Morbidity: ***  - Prescription Drug prescribed or managed: ***

## 2023-09-05 ENCOUNTER — Other Ambulatory Visit: Payer: Self-pay

## 2023-09-05 ENCOUNTER — Encounter (HOSPITAL_BASED_OUTPATIENT_CLINIC_OR_DEPARTMENT_OTHER): Payer: Self-pay

## 2023-09-05 ENCOUNTER — Emergency Department (HOSPITAL_BASED_OUTPATIENT_CLINIC_OR_DEPARTMENT_OTHER)

## 2023-09-05 ENCOUNTER — Inpatient Hospital Stay (HOSPITAL_BASED_OUTPATIENT_CLINIC_OR_DEPARTMENT_OTHER)
Admission: EM | Admit: 2023-09-05 | Discharge: 2023-09-09 | DRG: 871 | Disposition: A | Discharge status: Home Health | Attending: Internal Medicine | Admitting: Internal Medicine

## 2023-09-05 DIAGNOSIS — E872 Acidosis, unspecified: Secondary | ICD-10-CM | POA: Diagnosis present

## 2023-09-05 DIAGNOSIS — Z9221 Personal history of antineoplastic chemotherapy: Secondary | ICD-10-CM

## 2023-09-05 DIAGNOSIS — Z79899 Other long term (current) drug therapy: Secondary | ICD-10-CM | POA: Diagnosis not present

## 2023-09-05 DIAGNOSIS — Z7951 Long term (current) use of inhaled steroids: Secondary | ICD-10-CM | POA: Diagnosis not present

## 2023-09-05 DIAGNOSIS — R6520 Severe sepsis without septic shock: Secondary | ICD-10-CM | POA: Diagnosis present

## 2023-09-05 DIAGNOSIS — Z85038 Personal history of other malignant neoplasm of large intestine: Secondary | ICD-10-CM

## 2023-09-05 DIAGNOSIS — K219 Gastro-esophageal reflux disease without esophagitis: Secondary | ICD-10-CM | POA: Diagnosis present

## 2023-09-05 DIAGNOSIS — Z1152 Encounter for screening for COVID-19: Secondary | ICD-10-CM | POA: Diagnosis not present

## 2023-09-05 DIAGNOSIS — N1832 Chronic kidney disease, stage 3b: Secondary | ICD-10-CM | POA: Diagnosis present

## 2023-09-05 DIAGNOSIS — Z7952 Long term (current) use of systemic steroids: Secondary | ICD-10-CM

## 2023-09-05 DIAGNOSIS — R739 Hyperglycemia, unspecified: Secondary | ICD-10-CM | POA: Diagnosis present

## 2023-09-05 DIAGNOSIS — E66811 Obesity, class 1: Secondary | ICD-10-CM | POA: Diagnosis present

## 2023-09-05 DIAGNOSIS — I2489 Other forms of acute ischemic heart disease: Secondary | ICD-10-CM | POA: Diagnosis present

## 2023-09-05 DIAGNOSIS — A419 Sepsis, unspecified organism: Secondary | ICD-10-CM | POA: Diagnosis present

## 2023-09-05 DIAGNOSIS — Z6834 Body mass index (BMI) 34.0-34.9, adult: Secondary | ICD-10-CM | POA: Diagnosis not present

## 2023-09-05 DIAGNOSIS — R7303 Prediabetes: Secondary | ICD-10-CM | POA: Diagnosis present

## 2023-09-05 DIAGNOSIS — J189 Pneumonia, unspecified organism: Principal | ICD-10-CM | POA: Diagnosis present

## 2023-09-05 DIAGNOSIS — F17210 Nicotine dependence, cigarettes, uncomplicated: Secondary | ICD-10-CM | POA: Diagnosis present

## 2023-09-05 DIAGNOSIS — J440 Chronic obstructive pulmonary disease with acute lower respiratory infection: Secondary | ICD-10-CM | POA: Diagnosis present

## 2023-09-05 DIAGNOSIS — J9601 Acute respiratory failure with hypoxia: Secondary | ICD-10-CM | POA: Diagnosis present

## 2023-09-05 DIAGNOSIS — N179 Acute kidney failure, unspecified: Secondary | ICD-10-CM | POA: Diagnosis present

## 2023-09-05 LAB — CBC
HCT: 51.2 % (ref 39.0–52.0)
HCT: 51.8 % (ref 39.0–52.0)
Hemoglobin: 17.6 g/dL — ABNORMAL HIGH (ref 13.0–17.0)
Hemoglobin: 17.1 g/dL — ABNORMAL HIGH (ref 13.0–17.0)
MCH: 33.5 pg (ref 26.0–34.0)
MCH: 32.2 pg (ref 26.0–34.0)
MCHC: 34.4 g/dL (ref 30.0–36.0)
MCHC: 33 g/dL (ref 30.0–36.0)
MCV: 97.3 fL (ref 80.0–100.0)
MCV: 97.6 fL (ref 80.0–100.0)
Platelets: 156 K/uL (ref 150–400)
Platelets: 161 K/uL (ref 150–400)
RBC: 5.26 MIL/uL (ref 4.22–5.81)
RBC: 5.31 MIL/uL (ref 4.22–5.81)
RDW: 14.2 % (ref 11.5–15.5)
RDW: 14.2 % (ref 11.5–15.5)
WBC: 24.9 K/uL — ABNORMAL HIGH (ref 4.0–10.5)
WBC: 22.6 K/uL — ABNORMAL HIGH (ref 4.0–10.5)
nRBC: 0 % (ref 0.0–0.2)
nRBC: 0 % (ref 0.0–0.2)

## 2023-09-05 LAB — GLUCOSE, CAPILLARY
Glucose-Capillary: 152 mg/dL — ABNORMAL HIGH (ref 70–99)
Glucose-Capillary: 149 mg/dL — ABNORMAL HIGH (ref 70–99)
Glucose-Capillary: 139 mg/dL — ABNORMAL HIGH (ref 70–99)
Glucose-Capillary: 111 mg/dL — ABNORMAL HIGH (ref 70–99)

## 2023-09-05 LAB — RESP PANEL BY RT-PCR (RSV, FLU A&B, COVID)  RVPGX2
Influenza A by PCR: NEGATIVE
Influenza B by PCR: NEGATIVE
Resp Syncytial Virus by PCR: NEGATIVE
SARS Coronavirus 2 by RT PCR: NEGATIVE

## 2023-09-05 LAB — BASIC METABOLIC PANEL WITH GFR
Anion gap: 18 — ABNORMAL HIGH (ref 5–15)
BUN: 44 mg/dL — ABNORMAL HIGH (ref 8–23)
CO2: 25 mmol/L (ref 22–32)
Calcium: 9.9 mg/dL (ref 8.9–10.3)
Chloride: 89 mmol/L — ABNORMAL LOW (ref 98–111)
Creatinine, Ser: 1.75 mg/dL — ABNORMAL HIGH (ref 0.61–1.24)
GFR, Estimated: 42 mL/min — ABNORMAL LOW (ref >60)
Glucose, Bld: 186 mg/dL — ABNORMAL HIGH (ref 70–99)
Potassium: 4 mmol/L (ref 3.5–5.1)
Sodium: 132 mmol/L — ABNORMAL LOW (ref 135–145)

## 2023-09-05 LAB — LACTIC ACID, PLASMA
Lactic Acid, Venous: 4.7 mmol/L (ref 0.5–1.9)
Lactic Acid, Venous: 2.5 mmol/L (ref 0.5–1.9)
Lactic Acid, Venous: 2.3 mmol/L (ref 0.5–1.9)

## 2023-09-05 LAB — TROPONIN T, HIGH SENSITIVITY
Troponin T High Sensitivity: 51 ng/L — ABNORMAL HIGH (ref <19)
Troponin T High Sensitivity: 43 ng/L — ABNORMAL HIGH (ref <19)

## 2023-09-05 LAB — CREATININE, SERUM
Creatinine, Ser: 1.43 mg/dL — ABNORMAL HIGH (ref 0.61–1.24)
GFR, Estimated: 53 mL/min — ABNORMAL LOW (ref >60)

## 2023-09-05 LAB — PROCALCITONIN: Procalcitonin: 2.34 ng/mL

## 2023-09-05 LAB — CK: Total CK: 790 U/L — ABNORMAL HIGH (ref 49–397)

## 2023-09-05 LAB — HIV ANTIBODY (ROUTINE TESTING W REFLEX): HIV Screen 4th Generation wRfx: NONREACTIVE

## 2023-09-05 LAB — PRO BRAIN NATRIURETIC PEPTIDE: Pro Brain Natriuretic Peptide: 559 pg/mL — ABNORMAL HIGH (ref <300.0)

## 2023-09-05 MED ORDER — LACTATED RINGERS IV SOLN: INTRAVENOUS | Status: DC

## 2023-09-05 MED ORDER — ONDANSETRON HCL 4 MG/2ML IJ SOLN
4.0000 mg | Freq: Once | INTRAMUSCULAR | Status: AC
  Administered 2023-09-05: 4 mg via INTRAVENOUS
  Filled 2023-09-05: qty 2

## 2023-09-05 MED ORDER — SODIUM CHLORIDE 0.9 % IV SOLN
2.0000 g | Freq: Once | INTRAVENOUS | Status: AC
  Administered 2023-09-05: 2 g via INTRAVENOUS
  Filled 2023-09-05: qty 20

## 2023-09-05 MED ORDER — PROCHLORPERAZINE EDISYLATE 10 MG/2ML IJ SOLN
5.0000 mg | Freq: Four times a day (QID) | INTRAMUSCULAR | Status: DC | PRN
  Administered 2023-09-05 – 2023-09-08 (×5): 5 mg via INTRAVENOUS
  Filled 2023-09-05 (×4): qty 2

## 2023-09-05 MED ORDER — SODIUM CHLORIDE 0.9 % IV SOLN
2.0000 g | INTRAVENOUS | Status: DC
  Administered 2023-09-06 – 2023-09-09 (×6): 2 g via INTRAVENOUS
  Filled 2023-09-05 (×4): qty 20

## 2023-09-05 MED ORDER — INSULIN ASPART 100 UNIT/ML IJ SOLN
0.0000 [IU] | Freq: Three times a day (TID) | INTRAMUSCULAR | Status: DC
  Administered 2023-09-05: 2 [IU] via SUBCUTANEOUS
  Administered 2023-09-05 – 2023-09-07 (×6): 1 [IU] via SUBCUTANEOUS
  Administered 2023-09-08 (×3): 2 [IU] via SUBCUTANEOUS
  Administered 2023-09-08 (×2): 1 [IU] via SUBCUTANEOUS
  Administered 2023-09-08: 2 [IU] via SUBCUTANEOUS
  Administered 2023-09-09: 3 [IU] via SUBCUTANEOUS
  Administered 2023-09-09 (×2): 2 [IU] via SUBCUTANEOUS
  Administered 2023-09-09: 3 [IU] via SUBCUTANEOUS

## 2023-09-05 MED ORDER — SODIUM CHLORIDE 0.9 % IV BOLUS
500.0000 mL | Freq: Once | INTRAVENOUS | Status: AC
  Administered 2023-09-05: 500 mL via INTRAVENOUS

## 2023-09-05 MED ORDER — NICOTINE 21 MG/24HR TD PT24
21.0000 mg | MEDICATED_PATCH | Freq: Once | TRANSDERMAL | Status: AC
  Administered 2023-09-05: 21 mg via TRANSDERMAL
  Filled 2023-09-05: qty 1

## 2023-09-05 MED ORDER — SODIUM CHLORIDE 0.9 % IV SOLN
500.0000 mg | Freq: Once | INTRAVENOUS | Status: AC
  Administered 2023-09-05: 500 mg via INTRAVENOUS
  Filled 2023-09-05: qty 5

## 2023-09-05 MED ORDER — BUDESON-GLYCOPYRROL-FORMOTEROL 160-9-4.8 MCG/ACT IN AERO
2.0000 | INHALATION_SPRAY | Freq: Two times a day (BID) | RESPIRATORY_TRACT | Status: DC
  Administered 2023-09-05 – 2023-09-08 (×7): 2 via RESPIRATORY_TRACT
  Filled 2023-09-05: qty 5.9

## 2023-09-05 MED ORDER — METHYLPREDNISOLONE SODIUM SUCC 125 MG IJ SOLR
125.0000 mg | Freq: Once | INTRAMUSCULAR | Status: AC
  Administered 2023-09-05: 125 mg via INTRAVENOUS
  Filled 2023-09-05: qty 2

## 2023-09-05 MED ORDER — GUAIFENESIN-DM 100-10 MG/5ML PO SYRP
5.0000 mL | ORAL_SOLUTION | ORAL | Status: DC | PRN
  Administered 2023-09-08 (×2): 5 mL via ORAL
  Filled 2023-09-05: qty 10

## 2023-09-05 MED ORDER — TAMSULOSIN HCL 0.4 MG PO CAPS
0.4000 mg | ORAL_CAPSULE | Freq: Two times a day (BID) | ORAL | Status: DC
  Administered 2023-09-05 – 2023-09-09 (×12): 0.4 mg via ORAL
  Filled 2023-09-05 (×10): qty 1

## 2023-09-05 MED ORDER — IOHEXOL 350 MG/ML SOLN
75.0000 mL | Freq: Once | INTRAVENOUS | Status: AC | PRN
  Administered 2023-09-05: 60 mL via INTRAVENOUS

## 2023-09-05 MED ORDER — IPRATROPIUM-ALBUTEROL 0.5-2.5 (3) MG/3ML IN SOLN: 3.0000 mL | Freq: Four times a day (QID) | RESPIRATORY_TRACT | Status: DC

## 2023-09-05 MED ORDER — SODIUM CHLORIDE 0.9 % IV SOLN: 500.0000 mg | INTRAVENOUS | Status: DC

## 2023-09-05 MED ORDER — INSULIN ASPART 100 UNIT/ML IJ SOLN: 0.0000 [IU] | Freq: Every day | INTRAMUSCULAR | Status: DC

## 2023-09-05 MED ORDER — SODIUM CHLORIDE 0.9 % IV SOLN
500.0000 mg | INTRAVENOUS | Status: DC
  Administered 2023-09-06 – 2023-09-07 (×2): 500 mg via INTRAVENOUS
  Filled 2023-09-05 (×2): qty 5

## 2023-09-05 MED ORDER — ACETAMINOPHEN 325 MG PO TABS
650.0000 mg | ORAL_TABLET | Freq: Four times a day (QID) | ORAL | Status: DC | PRN
  Administered 2023-09-07: 650 mg via ORAL
  Filled 2023-09-05: qty 2

## 2023-09-05 MED ORDER — ENOXAPARIN SODIUM 60 MG/0.6ML IJ SOSY
50.0000 mg | PREFILLED_SYRINGE | INTRAMUSCULAR | Status: DC
  Administered 2023-09-05 – 2023-09-09 (×7): 50 mg via SUBCUTANEOUS
  Filled 2023-09-05 (×5): qty 0.6

## 2023-09-05 MED ORDER — POLYETHYLENE GLYCOL 3350 17 G PO PACK: 17.0000 g | PACK | Freq: Every day | ORAL | Status: DC | PRN

## 2023-09-05 MED ORDER — IPRATROPIUM-ALBUTEROL 0.5-2.5 (3) MG/3ML IN SOLN
3.0000 mL | Freq: Four times a day (QID) | RESPIRATORY_TRACT | Status: DC
  Administered 2023-09-05 (×2): 3 mL via RESPIRATORY_TRACT
  Filled 2023-09-05 (×2): qty 3

## 2023-09-05 MED ORDER — OXYCODONE-ACETAMINOPHEN 5-325 MG PO TABS
1.0000 | ORAL_TABLET | Freq: Four times a day (QID) | ORAL | Status: DC | PRN
  Administered 2023-09-05 – 2023-09-09 (×22): 1 via ORAL
  Filled 2023-09-05 (×15): qty 1

## 2023-09-05 MED ORDER — IPRATROPIUM-ALBUTEROL 0.5-2.5 (3) MG/3ML IN SOLN
3.0000 mL | Freq: Three times a day (TID) | RESPIRATORY_TRACT | Status: DC
  Administered 2023-09-05 – 2023-09-09 (×17): 3 mL via RESPIRATORY_TRACT
  Filled 2023-09-05 (×11): qty 3

## 2023-09-05 MED ORDER — LACTATED RINGERS IV SOLN: INTRAVENOUS | Status: AC

## 2023-09-05 MED ORDER — SODIUM CHLORIDE 0.9 % IV SOLN: 2.0000 g | INTRAVENOUS | Status: DC

## 2023-09-05 MED ORDER — IPRATROPIUM-ALBUTEROL 0.5-2.5 (3) MG/3ML IN SOLN
3.0000 mL | Freq: Once | RESPIRATORY_TRACT | Status: AC
  Administered 2023-09-05: 3 mL via RESPIRATORY_TRACT
  Filled 2023-09-05: qty 3

## 2023-09-05 MED ORDER — ENOXAPARIN SODIUM 40 MG/0.4ML IJ SOSY: 40.0000 mg | PREFILLED_SYRINGE | INTRAMUSCULAR | Status: DC

## 2023-09-05 MED ORDER — MELATONIN 5 MG PO TABS
5.0000 mg | ORAL_TABLET | Freq: Every evening | ORAL | Status: DC | PRN
  Administered 2023-09-07 – 2023-09-08 (×3): 5 mg via ORAL
  Filled 2023-09-05 (×2): qty 1

## 2023-09-05 NOTE — Progress Notes (Signed)
 Updated lactic acid  is 2.5. Dr.Hall was notified via secure message

## 2023-09-05 NOTE — Plan of Care (Signed)

## 2023-09-05 NOTE — ED Triage Notes (Signed)
 Pt reports he is here today due to sob, cp.

## 2023-09-05 NOTE — Progress Notes (Signed)
 PROGRESS NOTE    [name]  FMW:[id number] DOB: 1955-12-28 DOA: 09/05/2023 PCP: Renato Dorothey HERO, NP   Brief Narrative:  DAY ZERO NOTE  For complete history see HPI this morning.  Briefly [name] presents with worsening respiratory status and shortness of breath for 1 week noted to be hypoxic at intake with presumed COPD exacerbation, imaging confirms pneumonia and patient does meet sepsis criteria.  Assessment & Plan:   Principal Problem:   Pneumonia  Severe sepsis secondary to right upper lobe pneumonia, POA Acute hypoxic respiratory failure Lactic acidosis Failed outpatient Augmentin  Presented with leukocytosis, WBC 24K, tachycardia 113, lactic acidosis, 4.7. Continue nebs, oxygen, wean as tolerated (on room air at baseline)  Elevated troponin suspect demand ischemia [name] the setting of the above Positive troponin peaked at 51 and downtrended No evidence of acute ischemia on 12-lead EKG Monitor on telemetry   Multiple pulmonary nodules [name] the setting of tobacco abuse Seen on CT scan Recommend follow-up outpatient with repeat CT scan after resolution of the pneumonia.   Hyperglycemia Start heart healthy carb modified diet No results found for: HGBA1C   AKI on CKD 3B Improving, appears to be back to his baseline after IV fluid hydration   Obesity BMI 34 Recommend weight loss outpatient regular physical activity and healthy diet.   Generalized weakness with fall No evidence of fractures on imaging PT OT assessment Fall precautions  DVT prophylaxis: enoxaparin  (LOVENOX ) injection 40 mg Start: 09/05/23 1000 Code Status:   Code Status: Full Code Family Communication: None present  Status is: Inpatient  Dispo: The patient is from: Home              Anticipated d/c is to: Home              Anticipated d/c date is: 24 to 48 hours              Patient currently not medically stable for discharge  Consultants:  None  Procedures:   None  Antimicrobials:  Ceftriaxone   Subjective: No acute issues or events overnight reports ongoing shortness of breath and cough but improved from prior.  Denies nausea vomiting diarrhea constipation headache fevers chills or chest pain  Objective: Vitals:   09/05/23 0400 09/05/23 0415 09/05/23 0500 09/05/23 0505  BP: 132/76 127/80 119/77   Pulse: (!) 110 (!) 112 (!) 108   Resp: (!) 25 (!) 25 20   Temp:   99.5 F (37.5 C)   TempSrc:   Oral   SpO2: 94% 90% 95%   Weight:    104.1 kg  Height:    5' 8 (1.727 m)    Intake/Output Summary (Last 24 hours) at 09/05/2023 0731 Last data filed at 09/05/2023 0648 Gross per 24 hour  Intake 910.36 ml  Output --  Net 910.36 ml   Filed Weights   09/05/23 0505  Weight: 104.1 kg    Examination:  General:  Pleasantly resting [name] bed, No acute distress. HEENT:  Normocephalic atraumatic.  Sclerae nonicteric, noninjected.  Extraocular movements intact bilaterally. Neck:  Without mass or deformity.  Trachea is midline. Lungs: Scant bibasilar rales without overt rhonchi. Heart:  Regular rate and rhythm.  Without murmurs, rubs, or gallops. Abdomen:  Soft, nontender, nondistended.  Without guarding or rebound. Extremities: Without cyanosis, clubbing, or obvious deformity. Skin:  Warm and dry, no erythema.  Data Reviewed: I have personally reviewed following labs and imaging studies  CBC: Recent Labs  Lab 09/05/23 0126 09/05/23 0545  WBC  24.9* 22.6*  HGB 17.6* 17.1*  HCT 51.2 51.8  MCV 97.3 97.6  PLT 156 161   Basic Metabolic Panel: Recent Labs  Lab 09/05/23 0126 09/05/23 0545  NA 132*  --   K 4.0  --   CL 89*  --   CO2 25  --   GLUCOSE 186*  --   BUN 44*  --   CREATININE 1.75* 1.43*  CALCIUM  9.9  --    GFR: Estimated Creatinine Clearance: 57.8 mL/min (A) (by C-G formula based on SCr of 1.43 mg/dL (H)). Liver Function Tests: No results for input(s): AST, ALT, ALKPHOS, BILITOT, PROT, ALBUMIN [name] the last 168  hours. No results for input(s): LIPASE, AMYLASE [name] the last 168 hours. No results for input(s): AMMONIA [name] the last 168 hours. Coagulation Profile: No results for input(s): INR, PROTIME [name] the last 168 hours. Cardiac Enzymes: Recent Labs  Lab 09/05/23 0126  CKTOTAL 790*   BNP (last 3 results) Recent Labs    09/05/23 0126  PROBNP 559.0*   HbA1C: No results for input(s): HGBA1C [name] the last 72 hours. CBG: No results for input(s): GLUCAP [name] the last 168 hours. Lipid Profile: No results for input(s): CHOL, HDL, LDLCALC, TRIG, CHOLHDL, LDLDIRECT [name] the last 72 hours. Thyroid Function Tests: No results for input(s): TSH, T4TOTAL, FREET4, T3FREE, THYROIDAB [name] the last 72 hours. Anemia Panel: No results for input(s): VITAMINB12, FOLATE, FERRITIN, TIBC, IRON, RETICCTPCT [name] the last 72 hours. Sepsis Labs: Recent Labs  Lab 09/05/23 0126 09/05/23 0515  LATICACIDVEN 4.7* 2.5*    Recent Results (from the past 240 hours)  Resp panel by RT-PCR (RSV, Flu A&B, Covid) Anterior Nasal Swab     Status: None   Collection Time: 09/05/23  1:26 AM   Specimen: Anterior Nasal Swab  Result Value Ref Range Status   SARS Coronavirus 2 by RT PCR NEGATIVE NEGATIVE Final    Comment: (NOTE) SARS-CoV-2 target nucleic acids are NOT DETECTED.  The SARS-CoV-2 RNA is generally detectable [name] upper respiratory specimens during the acute phase of infection. The lowest concentration of SARS-CoV-2 viral copies this assay can detect is 138 copies/mL. A negative result does not preclude SARS-Cov-2 infection and should not be used as the sole basis for treatment or other patient management decisions. A negative result may occur with  improper specimen collection/handling, submission of specimen other than nasopharyngeal swab, presence of viral mutation(s) within the areas targeted by this assay, and inadequate number of viral copies(<138 copies/mL). A negative  result must be combined with clinical observations, patient history, and epidemiological information. The expected result is Negative.  Fact Sheet for Patients:  BloggerCourse.com  Fact Sheet for Healthcare Providers:  SeriousBroker.it  This test is no t yet approved or cleared by the [place]  FDA and  has been authorized for detection and/or diagnosis of SARS-CoV-2 by FDA under an Emergency Use Authorization (EUA). This EUA will remain  [name] effect (meaning this test can be used) for the duration of the COVID-19 declaration under Section 564(b)(1) of the Act, 21 U.S.C.section 360bbb-3(b)(1), unless the authorization is terminated  or revoked sooner.       Influenza A by PCR NEGATIVE NEGATIVE Final   Influenza B by PCR NEGATIVE NEGATIVE Final    Comment: (NOTE) The Xpert Xpress SARS-CoV-2/FLU/RSV plus assay is intended as an aid [name] the diagnosis of influenza from Nasopharyngeal swab specimens and should not be used as a sole basis for treatment. Nasal washings and aspirates are unacceptable for Xpert Xpress  SARS-CoV-2/FLU/RSV testing.  Fact Sheet for Patients: BloggerCourse.com  Fact Sheet for Healthcare Providers: SeriousBroker.it  This test is not yet approved or cleared by the [place]  FDA and has been authorized for detection and/or diagnosis of SARS-CoV-2 by FDA under an Emergency Use Authorization (EUA). This EUA will remain [name] effect (meaning this test can be used) for the duration of the COVID-19 declaration under Section 564(b)(1) of the Act, 21 U.S.C. section 360bbb-3(b)(1), unless the authorization is terminated or revoked.     Resp Syncytial Virus by PCR NEGATIVE NEGATIVE Final    Comment: (NOTE) Fact Sheet for Patients: BloggerCourse.com  Fact Sheet for Healthcare Providers: SeriousBroker.it  This  test is not yet approved or cleared by the [place]  FDA and has been authorized for detection and/or diagnosis of SARS-CoV-2 by FDA under an Emergency Use Authorization (EUA). This EUA will remain [name] effect (meaning this test can be used) for the duration of the COVID-19 declaration under Section 564(b)(1) of the Act, 21 U.S.C. section 360bbb-3(b)(1), unless the authorization is terminated or revoked.  Performed at [facility], [address] [place], [place] [address]          Radiology Studies: CT Angio Chest Pulmonary Embolism (PE) W or WO Contrast Result Date: 09/05/2023 CLINICAL DATA:  Shortness of breath and chest pain. History of colon cancer. Masslike consolidation [name] the right lung on same-day radiograph EXAM: CT ANGIOGRAPHY CHEST CT ABDOMEN AND PELVIS WITH CONTRAST TECHNIQUE: Multidetector CT imaging of the chest was performed using the standard protocol during bolus administration of intravenous contrast. Multiplanar CT image reconstructions and MIPs were obtained to evaluate the vascular anatomy. Multidetector CT imaging of the abdomen and pelvis was performed using the standard protocol during bolus administration of intravenous contrast. RADIATION DOSE REDUCTION: This exam was performed according to the departmental dose-optimization program which includes automated exposure control, adjustment of the mA and/or kV according to patient size and/or use of iterative reconstruction technique. CONTRAST:  60mL OMNIPAQUE  IOHEXOL  350 MG/ML SOLN COMPARISON:  Same day radiographs; CT chest 01/12/2021 and CT abdomen pelvis 09/09/2016 FINDINGS: CTA CHEST FINDINGS Cardiovascular: Normal caliber thoracic aorta without dissection. Coronary artery and aortic atherosclerotic calcification. Negative for pulmonary embolism. Mediastinum/Nodes: Trachea and esophagus are unremarkable. Borderline enlarged 1.0 cm subcarinal node on series 4, image 139 similar to 01/12/2021.  Lungs/Pleura: Moderate emphysema. Diffuse bronchial wall thickening. Patchy geographic airspace consolidation [name] the right upper lobe superimposed on a background of emphysema and bronchiectasis. This is favored due to pneumonia. 11 mm right upper lobe nodule on series 4, image 61 is unchanged from 2018 and benign. There are new pulmonary nodules [name] the left upper lobe. For example 6 mm nodule on 4/58 and 6 mm nodule on 4/78. No pleural effusion or pneumothorax. Musculoskeletal: No acute fracture. Review of the MIP images confirms the above findings. CT ABDOMEN and PELVIS FINDINGS Hepatobiliary: No acute abnormality. Redemonstrated hemangioma [name] the left hepatic lobe on series 5, image 11 measuring 2.7 cm. Pancreas: Unremarkable. Spleen: Unremarkable. Adrenals/Urinary Tract: Normal adrenal glands. Nonspecific bilateral perinephric stranding similar to prior. Nonobstructing left nephrolithiasis. No hydronephrosis. Unremarkable bladder. Stomach/Bowel: No bowel obstruction or bowel wall thickening postoperative change of partial colectomy with ileal colonic anastomosis herniated into the large ventral upper abdominal wall hernia. No evidence of obstruction. Stomach is within normal limits. Vascular/Lymphatic: No significant vascular findings are present. No enlarged abdominal or pelvic lymph nodes. Reproductive: No acute abnormality. Other: No free intraperitoneal fluid or air. Musculoskeletal: No acute fracture. Review of  the MIP images confirms the above findings. IMPRESSION: 1. Likely pneumonia [name] the right upper lobe superimposed on a background of emphysema and bronchiectasis. Follow-up [name] 6-8 weeks to resolution is recommended to exclude underlying malignancy. 2. New pulmonary nodules [name] the left upper lobe measuring up to 6 mm. These are favored infectious or inflammatory however are indeterminate. Fleischner criteria do not apply to patients with history of cancer. Continued attention on follow-up. 3. No acute  abnormality [name] the abdomen or pelvis. 4. Large ventral upper abdominal wall hernia containing a portion of the transverse colon without evidence of obstruction. 5. Nonobstructing left nephrolithiasis. Aortic Atherosclerosis (ICD10-I70.0) and Emphysema (ICD10-J43.9). Electronically Signed   By: Norman Gatlin M.D.   On: 09/05/2023 03:09   CT ABDOMEN PELVIS W CONTRAST Result Date: 09/05/2023 CLINICAL DATA:  Shortness of breath and chest pain. History of colon cancer. Masslike consolidation [name] the right lung on same-day radiograph EXAM: CT ANGIOGRAPHY CHEST CT ABDOMEN AND PELVIS WITH CONTRAST TECHNIQUE: Multidetector CT imaging of the chest was performed using the standard protocol during bolus administration of intravenous contrast. Multiplanar CT image reconstructions and MIPs were obtained to evaluate the vascular anatomy. Multidetector CT imaging of the abdomen and pelvis was performed using the standard protocol during bolus administration of intravenous contrast. RADIATION DOSE REDUCTION: This exam was performed according to the departmental dose-optimization program which includes automated exposure control, adjustment of the mA and/or kV according to patient size and/or use of iterative reconstruction technique. CONTRAST:  60mL OMNIPAQUE  IOHEXOL  350 MG/ML SOLN COMPARISON:  Same day radiographs; CT chest 01/12/2021 and CT abdomen pelvis 09/09/2016 FINDINGS: CTA CHEST FINDINGS Cardiovascular: Normal caliber thoracic aorta without dissection. Coronary artery and aortic atherosclerotic calcification. Negative for pulmonary embolism. Mediastinum/Nodes: Trachea and esophagus are unremarkable. Borderline enlarged 1.0 cm subcarinal node on series 4, image 139 similar to 01/12/2021. Lungs/Pleura: Moderate emphysema. Diffuse bronchial wall thickening. Patchy geographic airspace consolidation [name] the right upper lobe superimposed on a background of emphysema and bronchiectasis. This is favored due to pneumonia. 11 mm  right upper lobe nodule on series 4, image 61 is unchanged from 2018 and benign. There are new pulmonary nodules [name] the left upper lobe. For example 6 mm nodule on 4/58 and 6 mm nodule on 4/78. No pleural effusion or pneumothorax. Musculoskeletal: No acute fracture. Review of the MIP images confirms the above findings. CT ABDOMEN and PELVIS FINDINGS Hepatobiliary: No acute abnormality. Redemonstrated hemangioma [name] the left hepatic lobe on series 5, image 11 measuring 2.7 cm. Pancreas: Unremarkable. Spleen: Unremarkable. Adrenals/Urinary Tract: Normal adrenal glands. Nonspecific bilateral perinephric stranding similar to prior. Nonobstructing left nephrolithiasis. No hydronephrosis. Unremarkable bladder. Stomach/Bowel: No bowel obstruction or bowel wall thickening postoperative change of partial colectomy with ileal colonic anastomosis herniated into the large ventral upper abdominal wall hernia. No evidence of obstruction. Stomach is within normal limits. Vascular/Lymphatic: No significant vascular findings are present. No enlarged abdominal or pelvic lymph nodes. Reproductive: No acute abnormality. Other: No free intraperitoneal fluid or air. Musculoskeletal: No acute fracture. Review of the MIP images confirms the above findings. IMPRESSION: 1. Likely pneumonia [name] the right upper lobe superimposed on a background of emphysema and bronchiectasis. Follow-up [name] 6-8 weeks to resolution is recommended to exclude underlying malignancy. 2. New pulmonary nodules [name] the left upper lobe measuring up to 6 mm. These are favored infectious or inflammatory however are indeterminate. Fleischner criteria do not apply to patients with history of cancer. Continued attention on follow-up. 3. No acute abnormality [name] the abdomen or  pelvis. 4. Large ventral upper abdominal wall hernia containing a portion of the transverse colon without evidence of obstruction. 5. Nonobstructing left nephrolithiasis. Aortic Atherosclerosis  (ICD10-I70.0) and Emphysema (ICD10-J43.9). Electronically Signed   By: Norman Gatlin M.D.   On: 09/05/2023 03:09   CT HEAD WO CONTRAST ( ) Result Date: 09/05/2023 CLINICAL DATA:  Headache, increasing frequency or severity EXAM: CT HEAD WITHOUT CONTRAST TECHNIQUE: Contiguous axial images were obtained from the base of the skull through the vertex without intravenous contrast. RADIATION DOSE REDUCTION: This exam was performed according to the departmental dose-optimization program which includes automated exposure control, adjustment of the mA and/or kV according to patient size and/or use of iterative reconstruction technique. COMPARISON:  CT head 01/02/2012 FINDINGS: Brain: No evidence of large-territorial acute infarction. No parenchymal hemorrhage. No mass lesion. No extra-axial collection. No mass effect or midline shift. No hydrocephalus. Basilar cisterns are patent. Vascular: No hyperdense vessel. Atherosclerotic calcifications are present within the cavernous internal carotid and vertebral arteries. Skull: No acute fracture or focal lesion. Sinuses/Orbits: Paranasal sinuses and mastoid air cells are clear. Bilateral lens replacement. Otherwise the orbits are unremarkable. Other: None. IMPRESSION: No acute intracranial abnormality. Electronically Signed   By: Morgane  Naveau M.D.   On: 09/05/2023 03:01   DG Chest Port 1 View Result Date: 09/05/2023 CLINICAL DATA:  Shortness of breath and chest pain EXAM: PORTABLE CHEST 1 VIEW COMPARISON:  Radiograph and CT 01/12/2021 FINDINGS: Masslike airspace opacity [name] the right mid to upper lung. Bibasilar atelectasis. No pleural effusion or pneumothorax. Left chest wall Port-A-Cath tip at the venous confluence with the SVC. IMPRESSION: Masslike airspace opacity [name] the right mid to upper lung. This could be due to pneumonia however malignancy is not excluded. Recommend further evaluation with CT. Electronically Signed   By: Norman Gatlin M.D.   On: 09/05/2023  01:43        Scheduled Meds:  budesonide -glycopyrrolate -formoterol   2 puff Inhalation BID   enoxaparin  (LOVENOX ) injection  40 mg Subcutaneous Q24H   insulin  aspart  0-5 Units Subcutaneous QHS   insulin  aspart  0-9 Units Subcutaneous TID WC   ipratropium-albuterol   3 mL Nebulization Q6H   nicotine   21 mg Transdermal Once   tamsulosin   0.4 mg Oral BID   Continuous Infusions:  azithromycin  (ZITHROMAX ) 500 mg [name] sodium chloride  0.9 % 250 mL IVPB     cefTRIAXone  (ROCEPHIN )  IV     lactated ringers        LOS: 0 days   Time spent:  Elsie JAYSON Montclair, DO [facility]  If 7PM-7AM, please contact night-coverage [email or web address]  09/05/2023, 7:31 AM

## 2023-09-05 NOTE — Plan of Care (Signed)

## 2023-09-05 NOTE — ED Provider Notes (Signed)
 [facility] Emergency Department Provider Note MRN:  [id number]  Arrival date & time: 09/05/23     Chief Complaint   Shortness of Breath   History of Present Illness   [name] is a 68 y.o. year-old male with a history of COPD, colon cancer presenting to the ED with chief complaint of shortness of breath.  Persistent cough and shortness of breath over the past several days.  Was prescribed Augmentin , does not seem to be helping.  Becoming more and more weak.  Earlier today felt like his legs gave out due to weakness any fell to the ground.  Denies any injuries from the fall.  Was on the ground for 7 hours, too weak to get up.  Denies hitting his head.  Having some occasional chest pain.  Review of Systems  A thorough review of systems was obtained and all systems are negative except as noted in the HPI and PMH.   Patient's Health History    Past Medical History:  Diagnosis Date   Anemia    IRON 2005   Anxiety    Arthritis    LOWER LUMBAR STENOSIS , ARTHRITIS HANDS AND SHOULDERS   Asthma    Cancer (HCC) 2005   COLON SURGERY AND CHEMO   Complication of anesthesia    WOKE UP DURING PORT PLACEMENT AND ENDOSCOPY, SLOW TO AWAKEN FROM COLON RESECTION 2010   COPD (chronic obstructive pulmonary disease) (HCC)    Dyspnea    WITH EXERTION   GERD (gastroesophageal reflux disease)    Hydrocele, right    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    Pneumonia 2016 LAST    X 3 EPISODES IN PAST   Vitamin D deficiency     Past Surgical History:  Procedure Laterality Date   COLON RESECTION  2010   HERNIA REPAIR     HYDROCELE EXCISION Right 12/16/2016   Procedure: HYDROCELECTOMY ADULT RGHT;  Surgeon: Carolee Sherwood JONETTA DOUGLAS, MD;  Location: [facility];  Service: Urology;  Laterality: Right;   PORT PLACEMENT     LEFT CHEST   UPPER GI ENDOSCOPY      History reviewed. No pertinent family history.  Social History   Socioeconomic  History   Marital status: Divorced    Spouse name: Not on file   Number of children: Not on file   Years of education: Not on file   Highest education level: Not on file  Occupational History   Not on file  Tobacco Use   Smoking status: Every Day    Current packs/day: 1.00    Average packs/day: 1 pack/day for 45.0 years (45.0 ttl pk-yrs)    Types: Cigarettes   Smokeless tobacco: Never  Vaping Use   Vaping status: Never Used  Substance and Sexual Activity   Alcohol use: No   Drug use: No   Sexual activity: Not on file  Other Topics Concern   Not on file  Social History Narrative   Not on file   Social Drivers of Health   Financial Resource Strain: Not on file  Food Insecurity: Not on file  Transportation Needs: Not on file  Physical Activity: Not on file  Stress: Not on file  Social Connections: Not on file  Intimate Partner Violence: Not on file     Physical Exam   Vitals:   09/05/23 0152 09/05/23 0156  BP:    Pulse: (!) 122 (!) 126  Resp: 18 (!) 24  Temp:    SpO2: 93% 91%    CONSTITUTIONAL: Chronically ill-appearing, NAD NEURO/PSYCH:  Alert and oriented x 3, no focal deficits EYES:  eyes equal and reactive ENT/NECK:  no LAD, no JVD CARDIO: Tachycardic rate, well-perfused, normal S1 and S2 PULM:  CTAB no wheezing or rhonchi GI/GU:  non-distended, non-tender MSK/SPINE:  No gross deformities, no edema SKIN:  no rash, atraumatic   *Additional and/or pertinent findings included in MDM below  Diagnostic and Interventional Summary    EKG Interpretation Date/Time:  Friday September 05 2023 01:17:34 EDT Ventricular Rate:  127 PR Interval:  146 QRS Duration:  90 QT Interval:  292 QTC Calculation: 425 R Axis:   62  Text Interpretation: Sinus tachycardia RSR' in V1 or V2, right VCD or RVH Confirmed by Theadore Sharper [phone]) on 09/05/2023 3:20:23 AM       Labs Reviewed  CBC - Abnormal; Notable for the following components:      Result Value   WBC 24.9 (*)     Hemoglobin 17.6 (*)    All other components within normal limits  BASIC METABOLIC PANEL WITH GFR - Abnormal; Notable for the following components:   Sodium 132 (*)    Chloride 89 (*)    Glucose, Bld 186 (*)    BUN 44 (*)    Creatinine, Ser 1.75 (*)    GFR, Estimated 42 (*)    Anion gap 18 (*)    All other components within normal limits  PRO BRAIN NATRIURETIC PEPTIDE - Abnormal; Notable for the following components:   Pro Brain Natriuretic Peptide 559.0 (*)    All other components within normal limits  LACTIC ACID, PLASMA - Abnormal; Notable for the following components:   Lactic Acid, Venous 4.7 (*)    All other components within normal limits  TROPONIN T, HIGH SENSITIVITY - Abnormal; Notable for the following components:   Troponin T High Sensitivity 51 (*)    All other components within normal limits  RESP PANEL BY RT-PCR (RSV, FLU A&B, COVID)  RVPGX2  CULTURE, BLOOD (ROUTINE X 2)  CULTURE, BLOOD (ROUTINE X 2)  CK  LACTIC ACID, PLASMA  TROPONIN T, HIGH SENSITIVITY    CT Angio Chest Pulmonary Embolism (PE) W or WO Contrast  Final Result    CT HEAD WO CONTRAST ( )  Final Result    CT ABDOMEN PELVIS W CONTRAST  Final Result    DG Chest Port 1 View  Final Result      Medications  cefTRIAXone  (ROCEPHIN ) 2 g in sodium chloride  0.9 % 100 mL IVPB (2 g Intravenous New Bag/Given 09/05/23 0254)  azithromycin  (ZITHROMAX ) 500 mg in sodium chloride  0.9 % 250 mL IVPB (has no administration in time range)  ondansetron  (ZOFRAN ) injection 4 mg (has no administration in time range)  sodium chloride  0.9 % bolus 500 mL (has no administration in time range)  nicotine  (NICODERM CQ  - dosed in mg/24 hours) patch 21 mg (has no administration in time range)  ipratropium-albuterol  (DUONEB) 0.5-2.5 (3) MG/3ML nebulizer solution 3 mL (3 mLs Nebulization Given 09/05/23 0152)  methylPREDNISolone  sodium succinate (SOLU-MEDROL ) 125 mg/2 mL injection 125 mg (125 mg Intravenous Given 09/05/23 0142)   iohexol  (OMNIPAQUE ) 350 MG/ML injection 75 mL (60 mLs Intravenous Contrast Given 09/05/23 0228)     Procedures  /  Critical Care .Critical Care  Performed by: Theadore Sharper HERO, MD Authorized by: Theadore Sharper HERO, MD   Critical care provider statement:    Critical care time (minutes):  35  Critical care was necessary to treat or prevent imminent or life-threatening deterioration of the following conditions:  Sepsis   Critical care was time spent personally by me on the following activities:  Development of treatment plan with patient or surrogate, discussions with consultants, evaluation of patient's response to treatment, examination of patient, ordering and review of laboratory studies, ordering and review of radiographic studies, ordering and performing treatments and interventions, pulse oximetry, re-evaluation of patient's condition and review of old charts   ED Course and Medical Decision Making  Initial Impression and Ddx Lungs are largely clear, patient arrives tachycardic, low 90s on room air, tachypneic.  At least moderate concern for PE given the tachycardia, continued shortness of breath with occasional chest pain, the lack of wheezing or rhonchi, and the fact that he is not improving on antibiotics.  Could also be COPD exacerbation, pneumonia, viral bronchitis, CHF.  Past medical/surgical history that increases complexity of ED encounter: COPD, tobacco use  Interpretation of Diagnostics I personally reviewed the EKG and my interpretation is as follows: Sinus tachycardia  Marked leukocytosis, elevated lactic acid.  Also mild elevations in troponin and BNP  Patient Reassessment and Ultimate Disposition/Management     CT imaging revealing pneumonia, no obvious mass as suggested on chest x-ray.  No PE.  Given the near completed course of Augmentin  this is failed outpatient management with possible sepsis, will request hospitalist admission.  Patient management required discussion  with the following services or consulting groups:  Hospitalist Service  Complexity of Problems Addressed Acute illness or injury that poses threat of life of bodily function  Additional Data Reviewed and Analyzed Further history obtained from: Further history from spouse/family member  Additional Factors Impacting ED Encounter Risk Consideration of hospitalization  Chritopher Coster. Theadore, MD [facility] Emergency Medicine [facility] [email or web address]  Final Clinical Impressions(s) / ED Diagnoses     ICD-10-CM   1. Pneumonia of right lung due to infectious organism, unspecified part of lung  J18.9     2. Sepsis, due to unspecified organism, unspecified whether acute organ dysfunction present [facility])  A41.9       ED Discharge Orders     None        Discharge Instructions Discussed with and Provided to Patient:   Discharge Instructions   None      Theadore Ozell HERO, MD 09/05/23 [phone]

## 2023-09-05 NOTE — ED Notes (Signed)
 Dr. Theadore aware that lactic is 4.7

## 2023-09-05 NOTE — Evaluation (Signed)
 Physical Therapy Evaluation Patient Details Name: [name] MRN: [id number] DOB: 06/22/1955 Today's Date: 09/05/2023  History of Present Illness  Pt is a 68 y/o male admitted 09/05/23 for severe sepsis due to PNA, lactic acidosis, acute hypoxic respiratory failure, AKI and elevated troponin due to demand ischemia. Chest CT showed multiple pulmonary nodules to be followed up outpatient. 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  Clinical Impression  Pt admitted with above diagnosis.  Pt currently with functional limitations due to the deficits listed below (see PT Problem List). Pt will benefit from acute skilled PT to increase their independence and safety with mobility to allow discharge.  Pt in recliner on arrival and agreeable to ambulate.  Pt reports he has needed to use oxygen at home previously however not admitted with oxygen use and hoping to return home without need for O2 or assistive device.  Utilized RW today for endurance and pt could benefit from rollator upon d/c if agreeable.  Anticipate pt to make good progress with continued mobilizing in acute setting, and pt encouraged to ambulate with staff.   SpO2 91% on 2L O2 [place] at rest SpO2 93% on room air at rest after pt performed breathing SpO2 mostly 89-90% on room air during ambulation until end of ambulation while returning to recliner, Spo2 decreased to 84% on room air so 2L O2 [place] reapplied. SpO2 improved to 96% at rest with 2L         If plan is discharge home, recommend the following:     Can travel by private vehicle        Equipment Recommendations Rollator (4 wheels) (may benefit for endurance)  Recommendations for Other Services       Functional Status Assessment Patient has had a recent decline in their functional status and demonstrates the ability to make significant improvements in function in a reasonable and predictable amount of time.      Precautions / Restrictions Precautions Precautions: Fall;Other (comment) Recall of Precautions/Restrictions: Intact Precaution/Restrictions Comments: monitor O2 (does not wear at baseline)      Mobility  Bed Mobility               General bed mobility comments: pt in recliner    Transfers Overall transfer level: Needs assistance Equipment used: Rolling walker (2 wheels) Transfers: Sit to/from Stand Sit to Stand: Contact guard assist           General transfer comment: verbal cues for positioning and self assist    Ambulation/Gait Ambulation/Gait assistance: Contact guard assist Gait Distance (Feet): 120 Feet Assistive device: Rolling walker (2 wheels) Gait Pattern/deviations: Step-through pattern, Decreased stride length, Trunk flexed       General Gait Details: verbal cues for use of RW and posture; cues for breathing  Stairs            Wheelchair Mobility     Tilt Bed    Modified Rankin (Stroke Patients Only)       Balance Overall balance assessment: Needs assistance Sitting-balance support: No upper extremity supported, Feet supported Sitting balance-Leahy Scale: Good     Standing balance support: No upper extremity supported Standing balance-Leahy Scale: Good                               Pertinent Vitals/Pain Pain Assessment Pain Assessment: No/denies pain    Home Living Family/patient expects to be discharged  to:: Private residence Living Arrangements: Children Available Help at Discharge: Family;Available PRN/intermittently Type of Home: House Home Access: Stairs to enter Entrance Stairs-Rails:  (one pole near door) Secretary/administrator of Steps: 3 steep steps   Home Layout: One level Home Equipment: Shower seat Additional Comments: daughter works; pt reports they only see each other around 1 hour per day    Prior Function Prior Level of Function : Independent/Modified Independent;History of Falls (last six  months)             Mobility Comments: no AD for mobility, one recent fall prior to admission - fell to his knees in bathroom attempting to get off the toilet ADLs Comments: Indep with ADLs, IADLs. reports some recent issues stepping over tub     Extremity/Trunk Assessment        Lower Extremity Assessment Lower Extremity Assessment: Overall WFL for tasks assessed    Cervical / Trunk Assessment Cervical / Trunk Assessment: Normal  Communication   Communication Communication: No apparent difficulties    Cognition Arousal: Alert Behavior During Therapy: WFL for tasks assessed/performed   PT - Cognitive impairments: No apparent impairments                         Following commands: Intact       Cueing Cueing Techniques: Verbal cues     General Comments      Exercises     Assessment/Plan    PT Assessment Patient needs continued PT services  PT Problem List Decreased strength;Cardiopulmonary status limiting activity;Decreased activity tolerance;Decreased balance;Decreased mobility;Decreased knowledge of use of DME       PT Treatment Interventions Gait training;DME instruction;Balance training;Therapeutic exercise;Therapeutic activities;Functional mobility training;Patient/family education;Stair training    PT Goals (Current goals can be found in the Care Plan section)  Acute Rehab PT Goals PT Goal Formulation: With patient Time For Goal Achievement: 09/19/23 Potential to Achieve Goals: Good    Frequency Min 2X/week     Co-evaluation               AM-PAC PT 6 Clicks Mobility  Outcome Measure Help needed turning from your back to your side while in a flat bed without using bedrails?: A Little Help needed moving from lying on your back to sitting on the side of a flat bed without using bedrails?: A Little Help needed moving to and from a bed to a chair (including a wheelchair)?: A Little Help needed standing up from a chair using your  arms (e.g., wheelchair or bedside chair)?: A Little Help needed to walk in hospital room?: A Little Help needed climbing 3-5 steps with a railing? : A Little 6 Click Score: 18    End of Session Equipment Utilized During Treatment: Gait belt;Oxygen Activity Tolerance: Patient tolerated treatment well Patient left: in chair;with call bell/phone within reach;with chair alarm set   PT Visit Diagnosis: Difficulty in walking, not elsewhere classified (R26.2)    Time: 8945-8890 PT Time Calculation (min) (ACUTE ONLY): 15 min   Charges:   PT Evaluation $PT Eval Low Complexity: 1 Low   PT General Charges $$ ACUTE PT VISIT: 1 Visit       Tari PT, DPT Physical Therapist Acute Rehabilitation Services Office: [phone]   Kati L Payson 09/05/2023, 1:05 PM

## 2023-09-05 NOTE — H&P (Addendum)
 History and Physical  [name]:[id number] DOB: 16-Nov-1955 DOA: 09/05/2023  Referring physician: Accepted by Dr. Franky [facility], hospitalist service. PCP: Alan Dorothey HERO, NP  Outpatient Specialists: Cardiology, general surgery. Patient coming from: Home from [facility] ED.  Chief Complaint: Shortness of breath.  HPI: [name] is a 68 y.o. male with medical history significant for COPD, colon cancer status post colon surgery and chemotherapy, CKD 3A, current tobacco user, who presents to [facility] ED with complaints of shortness of breath for the past 7-10 days.  Associated with a persistent cough and generalized weakness, worse today.  Reportedly had a fall due to his weakness.  The patient was prescribed Augmentin  outpatient with no significant improvement.  Denies subjective fevers or chills.  States since he has been ill, he cut down on his tobacco use from 4 packs/day to 2 packs/6 days.  In the ER, tachycardic and tachypneic.  Requiring 2 L to maintain his O2 saturation above 92%.  CT angio chest was negative for pulmonary embolism, it showed findings suggestive of right upper lobe pneumonia, multiple pulmonary nodules.  The patient was started on empiric IV antibiotics, Rocephin  and IV azithromycin .  Admitted by Dr. Franky, [facility], hospitalist service.  Transferred to [facility] progressive care unit.  ED Course: Temperature 99.5.  BP 119/77, pulse 108, respiration rate 25, O2 saturation 95% on 2 L.  Lab studies notable for WBC 20 4.9K, Actiq  acid 4.7, 2.5.  Review of Systems: Review of systems as noted in the HPI. All other systems reviewed and are negative.   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 He has a 45 pack-year smoking history. He has never used smokeless tobacco. He reports that he does not drink alcohol and does not use drugs.   No Known Allergies  Family history: None reported  Prior to Admission medications   Medication Sig Start Date End Date Taking? Authorizing Provider  albuterol  (PROVENTIL ) (2.5 MG/3ML) 0.083% nebulizer solution Take 3 mLs (2.5 mg total) by nebulization every 4 (four) hours as needed for wheezing or shortness of breath. 01/15/21   Pearlean Manus, MD  albuterol  (VENTOLIN  HFA) 108 (90 Base) MCG/ACT inhaler Inhale 2 puffs into the lungs every 6 (six) hours as needed for wheezing or shortness of breath. 01/15/21   Emokpae, Courage,  MD  albuterol  (VENTOLIN  HFA) 108 (90 Base) MCG/ACT inhaler Inhale 2 puffs into the lungs every 4 (four) hours as needed. 05/13/23   Alan Dorothey HERO, NP  bumetanide  (BUMEX ) 1 MG tablet Take 1 tablet (1 mg total) by mouth 2 (two) times daily. 01/15/21   Pearlean Manus, MD  calcium  carbonate (TUMS - DOSED IN MG ELEMENTAL CALCIUM ) 500 MG chewable tablet Chew 1 tablet as needed by mouth for indigestion or heartburn.    [provider]  chlorthalidone  (HYGROTON ) 25 MG tablet Take 25 mg by mouth daily.    [provider]  chlorthalidone  (HYGROTON ) 25 MG tablet Take 1 tablet (25 mg total) by mouth every morning with food. 02/24/23   Alan Dorothey HERO, NP  DULoxetine (CYMBALTA) 30 MG capsule Take 30 mg by mouth daily.    [provider]  ipratropium-albuterol  (DUONEB) 0.5-2.5 (3) MG/3ML SOLN Take 3 mLs by nebulization every 6 (six) hours as needed (for shortness of breath). 01/15/21   Pearlean Manus, MD  ipratropium-albuterol  (DUONEB) 0.5-2.5 (3) MG/3ML SOLN Inhale 3 mLs into the lungs every 6 (six) hours as needed. 06/10/23 08/13/23    lidocaine  (XYLOCAINE ) 2 % solution Use as directed 15 mLs in the mouth or throat every 6 (six) hours as needed for mouth pain. 07/05/23   Curatolo, Adam, DO  linaclotide (LINZESS) 290 MCG CAPS capsule Take 290 mcg by mouth as needed.    [provider]  methylPREDNISolone  (MEDROL  DOSEPAK) 4 MG TBPK tablet Follow package insert 07/05/23   Curatolo, Adam, DO  nystatin -triamcinolone  (MYCOLOG II) cream Apply to the scrotum twice daily as directed 07/03/23   Alan Sherwood JONETTA DOUGLAS, MD  oxyCODONE -acetaminophen  (PERCOCET/ROXICET) 5-325 MG tablet Take 1 tablet by mouth every 6 (six) hours as needed for severe pain or moderate pain. 12/29/20   Lorriane Holmes, MD  potassium chloride  (KLOR-CON ) 10 MEQ tablet Take 1 tablet (10 mEq total) by mouth daily. Take While taking Bumex  (Fluid Medicine) 01/15/21   Pearlean Manus, MD  predniSONE  (DELTASONE ) 5 MG tablet Take 5 mg by mouth 3 (three) times daily. 08/07/23   [provider]  tamsulosin  (FLOMAX ) 0.4 MG CAPS capsule Take 1 capsule (0.4 mg total) by mouth daily after supper. 01/15/21   Pearlean Manus, MD  tamsulosin  (FLOMAX ) 0.4 MG CAPS capsule Take 1 capsule (0.4 mg total) by mouth 2 (two) times daily. 07/03/23   Alan Sherwood JONETTA DOUGLAS, MD  tamsulosin  (FLOMAX ) 0.4 MG CAPS capsule Take 1 capsule (0.4 mg total) by mouth daily. 01/15/23     TRELEGY ELLIPTA 100-62.5-25 MCG/ACT AEPB Inhale 1 puff into the lungs daily. 08/15/21    [provider]    Physical Exam: BP 119/77 (BP Location: Left Arm)   Pulse (!) 108   Temp 99.5 F (37.5 C) (Oral)   Resp 20   Ht 5' 8 (1.727 m)   Wt 104.1 kg   SpO2 95%   BMI 34.90 kg/m   General: 68 y.o. year-old male well developed well nourished in no acute distress.  Alert and oriented x3. Cardiovascular: Regular rate and rhythm with no rubs or gallops.  No thyromegaly or JVD noted.  No lower extremity edema. 2/4 pulses in all 4 extremities. Respiratory: Diffuse rales, worse right upper lobe region.  Mild wheezing.  Poor inspiratory effort. Abdomen: Soft nontender nondistended with normal bowel sounds x4 quadrants. Muskuloskeletal: No cyanosis, clubbing or edema noted bilaterally Neuro: CN II-XII intact, strength, sensation, reflexes Skin: No ulcerative lesions noted or rashes Psychiatry: Judgement  and insight appear normal. Mood is appropriate for condition and setting          Labs on Admission:  Basic Metabolic Panel: Recent Labs  Lab 09/05/23 0126  NA 132*  K 4.0  CL 89*  CO2 25  GLUCOSE 186*  BUN 44*  CREATININE 1.75*  CALCIUM  9.9   Liver Function Tests: No results for input(s): AST, ALT, ALKPHOS, BILITOT, PROT, ALBUMIN in the last 168 hours. No results for input(s): LIPASE, AMYLASE in the last 168 hours. No results for input(s): AMMONIA in the last 168 hours. CBC: Recent Labs  Lab 09/05/23 0126  WBC 24.9*  HGB 17.6*  HCT 51.2  MCV 97.3  PLT 156   Cardiac Enzymes: Recent Labs  Lab 09/05/23 0126  CKTOTAL 790*    BNP (last 3 results) No results for input(s): BNP in the last 8760 hours.  ProBNP (last 3 results) Recent Labs    09/05/23 0126  PROBNP 559.0*    CBG: No results for input(s): GLUCAP in the last 168 hours.  Radiological Exams on Admission: CT Angio Chest Pulmonary Embolism (PE) W or WO Contrast Result Date: 09/05/2023 CLINICAL DATA:  Shortness of breath and chest pain. History of colon cancer.  Masslike consolidation in the right lung on same-day radiograph EXAM: CT ANGIOGRAPHY CHEST CT ABDOMEN AND PELVIS WITH CONTRAST TECHNIQUE: Multidetector CT imaging of the chest was performed using the standard protocol during bolus administration of intravenous contrast. Multiplanar CT image reconstructions and MIPs were obtained to evaluate the vascular anatomy. Multidetector CT imaging of the abdomen and pelvis was performed using the standard protocol during bolus administration of intravenous contrast. RADIATION DOSE REDUCTION: This exam was performed according to the departmental dose-optimization program which includes automated exposure control, adjustment of the mA and/or kV according to patient size and/or use of iterative reconstruction technique. CONTRAST:  60mL OMNIPAQUE  IOHEXOL  350 MG/ML SOLN COMPARISON:  Same day radiographs; CT chest 01/12/2021 and CT abdomen pelvis 09/09/2016 FINDINGS: CTA CHEST FINDINGS Cardiovascular: Normal caliber thoracic aorta without dissection. Coronary artery and aortic atherosclerotic calcification. Negative for pulmonary embolism. Mediastinum/Nodes: Trachea and esophagus are unremarkable. Borderline enlarged 1.0 cm subcarinal node on series 4, image 139 similar to 01/12/2021. Lungs/Pleura: Moderate emphysema. Diffuse bronchial wall thickening. Patchy geographic airspace consolidation in the right upper lobe superimposed on a background of emphysema and bronchiectasis. This is favored due to pneumonia. 11 mm right upper lobe nodule on series 4, image 61 is unchanged from 2018 and benign. There are new pulmonary nodules in the left upper lobe. For example 6 mm nodule on 4/58 and 6 mm nodule on 4/78. No pleural effusion or pneumothorax. Musculoskeletal: No acute fracture. Review of the MIP images confirms the above findings. CT ABDOMEN and PELVIS FINDINGS Hepatobiliary: No acute abnormality. Redemonstrated hemangioma in the left hepatic lobe on series 5, image 11 measuring 2.7  cm. Pancreas: Unremarkable. Spleen: Unremarkable. Adrenals/Urinary Tract: Normal adrenal glands. Nonspecific bilateral perinephric stranding similar to prior. Nonobstructing left nephrolithiasis. No hydronephrosis. Unremarkable bladder. Stomach/Bowel: No bowel obstruction or bowel wall thickening postoperative change of partial colectomy with ileal colonic anastomosis herniated into the large ventral upper abdominal wall hernia. No evidence of obstruction. Stomach is within normal limits. Vascular/Lymphatic: No significant vascular findings are present. No enlarged abdominal or pelvic lymph nodes. Reproductive: No acute abnormality. Other: No free intraperitoneal fluid or air. Musculoskeletal: No acute fracture. Review of the MIP images confirms the above findings. IMPRESSION: 1. Likely pneumonia in the right upper lobe superimposed on a background  of emphysema and bronchiectasis. Follow-up in 6-8 weeks to resolution is recommended to exclude underlying malignancy. 2. New pulmonary nodules in the left upper lobe measuring up to 6 mm. These are favored infectious or inflammatory however are indeterminate. Fleischner criteria do not apply to patients with history of cancer. Continued attention on follow-up. 3. No acute abnormality in the abdomen or pelvis. 4. Large ventral upper abdominal wall hernia containing a portion of the transverse colon without evidence of obstruction. 5. Nonobstructing left nephrolithiasis. Aortic Atherosclerosis (ICD10-I70.0) and Emphysema (ICD10-J43.9). Electronically Signed   By: Norman Gatlin M.D.   On: 09/05/2023 03:09   CT ABDOMEN PELVIS W CONTRAST Result Date: 09/05/2023 CLINICAL DATA:  Shortness of breath and chest pain. History of colon cancer. Masslike consolidation in the right lung on same-day radiograph EXAM: CT ANGIOGRAPHY CHEST CT ABDOMEN AND PELVIS WITH CONTRAST TECHNIQUE: Multidetector CT imaging of the chest was performed using the standard protocol during bolus  administration of intravenous contrast. Multiplanar CT image reconstructions and MIPs were obtained to evaluate the vascular anatomy. Multidetector CT imaging of the abdomen and pelvis was performed using the standard protocol during bolus administration of intravenous contrast. RADIATION DOSE REDUCTION: This exam was performed according to the departmental dose-optimization program which includes automated exposure control, adjustment of the mA and/or kV according to patient size and/or use of iterative reconstruction technique. CONTRAST:  60mL OMNIPAQUE  IOHEXOL  350 MG/ML SOLN COMPARISON:  Same day radiographs; CT chest 01/12/2021 and CT abdomen pelvis 09/09/2016 FINDINGS: CTA CHEST FINDINGS Cardiovascular: Normal caliber thoracic aorta without dissection. Coronary artery and aortic atherosclerotic calcification. Negative for pulmonary embolism. Mediastinum/Nodes: Trachea and esophagus are unremarkable. Borderline enlarged 1.0 cm subcarinal node on series 4, image 139 similar to 01/12/2021. Lungs/Pleura: Moderate emphysema. Diffuse bronchial wall thickening. Patchy geographic airspace consolidation in the right upper lobe superimposed on a background of emphysema and bronchiectasis. This is favored due to pneumonia. 11 mm right upper lobe nodule on series 4, image 61 is unchanged from 2018 and benign. There are new pulmonary nodules in the left upper lobe. For example 6 mm nodule on 4/58 and 6 mm nodule on 4/78. No pleural effusion or pneumothorax. Musculoskeletal: No acute fracture. Review of the MIP images confirms the above findings. CT ABDOMEN and PELVIS FINDINGS Hepatobiliary: No acute abnormality. Redemonstrated hemangioma in the left hepatic lobe on series 5, image 11 measuring 2.7 cm. Pancreas: Unremarkable. Spleen: Unremarkable. Adrenals/Urinary Tract: Normal adrenal glands. Nonspecific bilateral perinephric stranding similar to prior. Nonobstructing left nephrolithiasis. No hydronephrosis. Unremarkable  bladder. Stomach/Bowel: No bowel obstruction or bowel wall thickening postoperative change of partial colectomy with ileal colonic anastomosis herniated into the large ventral upper abdominal wall hernia. No evidence of obstruction. Stomach is within normal limits. Vascular/Lymphatic: No significant vascular findings are present. No enlarged abdominal or pelvic lymph nodes. Reproductive: No acute abnormality. Other: No free intraperitoneal fluid or air. Musculoskeletal: No acute fracture. Review of the MIP images confirms the above findings. IMPRESSION: 1. Likely pneumonia in the right upper lobe superimposed on a background of emphysema and bronchiectasis. Follow-up in 6-8 weeks to resolution is recommended to exclude underlying malignancy. 2. New pulmonary nodules in the left upper lobe measuring up to 6 mm. These are favored infectious or inflammatory however are indeterminate. Fleischner criteria do not apply to patients with history of cancer. Continued attention on follow-up. 3. No acute abnormality in the abdomen or pelvis. 4. Large ventral upper abdominal wall hernia containing a portion of the transverse colon without evidence of obstruction. 5.  Nonobstructing left nephrolithiasis. Aortic Atherosclerosis (ICD10-I70.0) and Emphysema (ICD10-J43.9). Electronically Signed   By: Norman Gatlin M.D.   On: 09/05/2023 03:09   CT HEAD WO CONTRAST ( ) Result Date: 09/05/2023 CLINICAL DATA:  Headache, increasing frequency or severity EXAM: CT HEAD WITHOUT CONTRAST TECHNIQUE: Contiguous axial images were obtained from the base of the skull through the vertex without intravenous contrast. RADIATION DOSE REDUCTION: This exam was performed according to the departmental dose-optimization program which includes automated exposure control, adjustment of the mA and/or kV according to patient size and/or use of iterative reconstruction technique. COMPARISON:  CT head 01/02/2012 FINDINGS: Brain: No evidence of  large-territorial acute infarction. No parenchymal hemorrhage. No mass lesion. No extra-axial collection. No mass effect or midline shift. No hydrocephalus. Basilar cisterns are patent. Vascular: No hyperdense vessel. Atherosclerotic calcifications are present within the cavernous internal carotid and vertebral arteries. Skull: No acute fracture or focal lesion. Sinuses/Orbits: Paranasal sinuses and mastoid air cells are clear. Bilateral lens replacement. Otherwise the orbits are unremarkable. Other: None. IMPRESSION: No acute intracranial abnormality. Electronically Signed   By: Morgane  Naveau M.D.   On: 09/05/2023 03:01   DG Chest Port 1 View Result Date: 09/05/2023 CLINICAL DATA:  Shortness of breath and chest pain EXAM: PORTABLE CHEST 1 VIEW COMPARISON:  Radiograph and CT 01/12/2021 FINDINGS: Masslike airspace opacity in the right mid to upper lung. Bibasilar atelectasis. No pleural effusion or pneumothorax. Left chest wall Port-A-Cath tip at the venous confluence with the SVC. IMPRESSION: Masslike airspace opacity in the right mid to upper lung. This could be due to pneumonia however malignancy is not excluded. Recommend further evaluation with CT. Electronically Signed   By: Norman Gatlin M.D.   On: 09/05/2023 01:43    EKG: I independently viewed the EKG done and my findings are as followed: Sinus tachycardia rate of 107.  Nonspecific ST-T changes with QTc 425  Assessment/Plan Present on Admission:  Pneumonia  Principal Problem:   Pneumonia  Severe sepsis secondary to right upper lobe pneumonia, POA Failed outpatient antibiotic therapy with Augmentin  Current smoker from 4 packs/day to 2 packs/ 6 days Presented with leukocytosis, WBC 24K, tachycardia 113, lactic acidosis, 4.7. Follow-up peripheral blood cultures, sputum culture, baseline procalcitonin Continue empiric IV antibiotics Rocephin  and IV azithromycin  Bronchodilator nebulizers Incentive spirometer with early  mobilization Continue IV fluid Monitor urine output and vital signs  Lactic acidosis secondary to the above Continue IV fluid and IV antibiotics Trend lactic acid  Elevated troponin suspect demand ischemia in the setting of the above Positive troponin peaked at 51 and downtrended No evidence of acute ischemia on 12-lead EKG Monitor on telemetry  Acute hypoxic respiratory failure secondary to the above Not on oxygen mentation at baseline Currently requiring 2 L to maintain O2 saturation above 92% Wean off O2 supplementation as tolerated  Multiple pulmonary nodules in the setting of tobacco abuse Seen on CT scan Recommend follow-up outpatient with repeat CT scan after resolution of the pneumonia.  Hyperglycemia Start heart healthy carb modified diet Obtain hemoglobin A1c Subcu insulin  coverage  AKI on CKD 3B Improving, appears to be back to his baseline after IV fluid hydration Avoid nephrotoxic agents, dehydration, and hypotension Monitor urine output  Obesity BMI 34 Recommend weight loss outpatient regular physical activity and healthy diet.  Generalized weakness with fall No evidence of fractures on imaging PT OT assessment Fall precautions   Critical care time: 65 minutes.   DVT prophylaxis: Subcu heparin 3 times daily  Code Status: Full code.  Family Communication: Daughter at bedside.  Disposition Plan: Admitted by Dr. Franky, [facility], hospitalist service, to progressive care unit  Consults called: None.  Admission status: Inpatient status.   Status is: Inpatient status. The patient requires at least 2 midnights for further evaluation and treatment of present condition.   Terry LOISE Hurst MD [facility] Pager [phone]  If 7PM-7AM, please contact night-coverage [email or web address] Password [email or web address]  09/05/2023, 5:25 AM

## 2023-09-05 NOTE — Evaluation (Signed)
 Occupational Therapy Evaluation Patient Details Name: [name] MRN: [id number] DOB: [birth year] Today's Date: 09/05/2023   History of Present Illness   Pt is a 68 y/o male presenting to [facility] ED with progressive SOB. Pt found to have severe sepsis due to PNA, lactic acidosis, acute hypoxic respiratory failure, AKI and elevated troponin due to demand ischemia. Chest CT showed multiple pulmonary nodules to be followed up outpatient. 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     Clinical Impressions PTA, pt lives with daughter and reports typically Independent with ADLs, IADLs and mobility without need for AD. Pt presents now with deficits in cardiopulmonary endurance, standing balance and overall strength. Pt able to mobilize to/from bathroom using IV and CGA, able to manage ADLs with Setup to Min A. Initiated discussion of breathing techniques and energy conservation with further education to follow. Anticipate good improvements acutely to return home with HHOT upon DC. Pt may benefit from DME for mobility upon DC- to be further assessed.  SpO2 92% on 2 L O2     If plan is discharge home, recommend the following:   A little help with bathing/dressing/bathroom;Assistance with cooking/housework;A little help with walking and/or transfers     Functional Status Assessment   Patient has had a recent decline in their functional status and demonstrates the ability to make significant improvements in function in a reasonable and predictable amount of time.     Equipment Recommendations   Other (comment) (TBD pending progress)     Recommendations for Other Services         Precautions/Restrictions   Precautions Precautions: Fall;Other (comment) Recall of Precautions/Restrictions: Intact Precaution/Restrictions Comments: monitor O2 (does not wear at baseline) Restrictions Weight Bearing Restrictions  Per Provider Order: No     Mobility Bed Mobility Overal bed mobility: Needs Assistance Bed Mobility: Supine to Sit     Supine to sit: Supervision, HOB elevated, Used rails          Transfers Overall transfer level: Needs assistance Equipment used: None Transfers: Sit to/from Stand Sit to Stand: Min assist           General transfer comment: light Min A to stand from bedside      Balance Overall balance assessment: Needs assistance Sitting-balance support: No upper extremity supported, Feet supported Sitting balance-Leahy Scale: Good                                     ADL either performed or assessed with clinical judgement   ADL Overall ADL's : Needs assistance/impaired Eating/Feeding: Independent   Grooming: Contact guard assist;Standing   Upper Body Bathing: Set up;Sitting   Lower Body Bathing: Minimal assistance;Sit to/from stand;Sitting/lateral leans   Upper Body Dressing : Set up;Sitting   Lower Body Dressing: Minimal assistance;Sit to/from stand;Sitting/lateral leans   Toilet Transfer: Economist Details (indicate cue type and reason): use of IV pole Toileting- Clothing Manipulation and Hygiene: Sit to/from stand;Sitting/lateral lean;Supervision/safety       Functional mobility during ADLs: Contact guard assist       Vision Ability to See in Adequate Light: 0 Adequate Patient Visual Report: No change from baseline Vision Assessment?: No apparent visual deficits     Perception         Praxis         Pertinent Vitals/Pain Pain Assessment Pain Assessment:  No/denies pain     Extremity/Trunk Assessment Upper Extremity Assessment Upper Extremity Assessment: Overall WFL for tasks assessed;Right hand dominant   Lower Extremity Assessment Lower Extremity Assessment: Defer to PT evaluation   Cervical / Trunk Assessment Cervical / Trunk Assessment: Normal    Communication Communication Communication: No apparent difficulties   Cognition Arousal: Alert Behavior During Therapy: WFL for tasks assessed/performed Cognition: No apparent impairments                               Following commands: Intact       Cueing  General Comments   Cueing Techniques: Verbal cues      Exercises     Shoulder Instructions      Home Living Family/patient expects to be discharged to:: Private residence Living Arrangements: Children Available Help at Discharge: Family;Available PRN/intermittently Type of Home: House Home Access: Stairs to enter [employer] of Steps: 3 steep steps Entrance Stairs-Rails:  (one pole near door) Home Layout: One level     Bathroom Shower/Tub: Chief Strategy Officer: Standard     Home Equipment: Shower seat   Additional Comments: daughter works; pt reports they only see each other around 1 hour per day      Prior Functioning/Environment Prior Level of Function : Independent/Modified Independent;History of Falls (last six months)             Mobility Comments: no AD for mobility, one recent fall- was unable to get up without assist ADLs Comments: Indep with ADLs, IADLs. reports some recent issues stepping over tub    OT Problem List: Decreased strength;Decreased activity tolerance;Impaired balance (sitting and/or standing);Cardiopulmonary status limiting activity;Decreased knowledge of use of DME or AE   OT Treatment/Interventions: Self-care/ADL training;Therapeutic exercise;Energy conservation;DME and/or AE instruction;Therapeutic activities;Patient/family education;Balance training      OT Goals(Current goals can be found in the care plan section)   Acute Rehab OT Goals Patient Stated Goal: wean from O2, not have any more falls OT Goal Formulation: With patient Time For Goal Achievement: 09/19/23 Potential to Achieve Goals: Good ADL Goals Pt Will Perform Lower  Body Bathing: with modified independence;sit to/from stand;sitting/lateral leans Pt Will Transfer to Toilet: with modified independence;ambulating Pt Will Perform Tub/Shower Transfer: Tub transfer;with set-up;ambulating;shower seat Additional ADL Goal #1: Pt to verbalize at least 3 energy conservation strategies to implement during ADLs, IADLs and mobility at home   OT Frequency:  Min 2X/week    Co-evaluation              AM-PAC OT 6 Clicks Daily Activity     Outcome Measure Help from another person eating meals?: None Help from another person taking care of personal grooming?: A Little Help from another person toileting, which includes using toliet, bedpan, or urinal?: A Little Help from another person bathing (including washing, rinsing, drying)?: A Little Help from another person to put on and taking off regular upper body clothing?: A Little Help from another person to put on and taking off regular lower body clothing?: A Little 6 Click Score: 19   End of Session Equipment Utilized During Treatment: Gait belt;Oxygen Nurse Communication: Mobility status;Other (comment) (O2; red tint to urine)  Activity Tolerance: Patient tolerated treatment well Patient left: in chair;with call bell/phone within reach;with chair alarm set  OT Visit Diagnosis: Other abnormalities of gait and mobility (R26.89);Unsteadiness on feet (R26.81);Muscle weakness (generalized) (M62.81)  Time: 9095-9062 OT Time Calculation (min): 33 min Charges:  OT General Charges $OT Visit: 1 Visit OT Evaluation $OT Eval Low Complexity: 1 Low OT Treatments $Self Care/Home Management : 8-22 mins  Mliss NOVAK, OTR/L Acute Rehab Services Office: [phone]   Mliss Fish 09/05/2023, 9:48 AM

## 2023-09-05 NOTE — Progress Notes (Signed)
 Elink monitoring for the code sepsis protocol.

## 2023-09-06 DIAGNOSIS — J189 Pneumonia, unspecified organism: Secondary | ICD-10-CM | POA: Diagnosis not present

## 2023-09-06 LAB — GLUCOSE, CAPILLARY
Glucose-Capillary: 142 mg/dL — ABNORMAL HIGH (ref 70–99)
Glucose-Capillary: 138 mg/dL — ABNORMAL HIGH (ref 70–99)
Glucose-Capillary: 115 mg/dL — ABNORMAL HIGH (ref 70–99)
Glucose-Capillary: 118 mg/dL — ABNORMAL HIGH (ref 70–99)

## 2023-09-06 LAB — CBC
HCT: 47.9 % (ref 39.0–52.0)
Hemoglobin: 16 g/dL (ref 13.0–17.0)
MCH: 33.4 pg (ref 26.0–34.0)
MCHC: 33.4 g/dL (ref 30.0–36.0)
MCV: 100 fL (ref 80.0–100.0)
Platelets: 173 K/uL (ref 150–400)
RBC: 4.79 MIL/uL (ref 4.22–5.81)
RDW: 14.2 % (ref 11.5–15.5)
WBC: 22.3 K/uL — ABNORMAL HIGH (ref 4.0–10.5)
nRBC: 0 % (ref 0.0–0.2)

## 2023-09-06 LAB — BASIC METABOLIC PANEL WITH GFR
Anion gap: 11 (ref 5–15)
BUN: 42 mg/dL — ABNORMAL HIGH (ref 8–23)
CO2: 31 mmol/L (ref 22–32)
Calcium: 9.4 mg/dL (ref 8.9–10.3)
Chloride: 93 mmol/L — ABNORMAL LOW (ref 98–111)
Creatinine, Ser: 1.19 mg/dL (ref 0.61–1.24)
GFR, Estimated: >60 mL/min (ref >60)
Glucose, Bld: 134 mg/dL — ABNORMAL HIGH (ref 70–99)
Potassium: 4.8 mmol/L (ref 3.5–5.1)
Sodium: 135 mmol/L (ref 135–145)

## 2023-09-06 LAB — HEMOGLOBIN A1C
Hgb A1c MFr Bld: 6 % — ABNORMAL HIGH (ref 4.8–5.6)
Mean Plasma Glucose: 126 mg/dL

## 2023-09-06 NOTE — Progress Notes (Signed)
 PROGRESS NOTE    [name]  FMW:[id number] DOB: 12/02/1955 DOA: 09/05/2023 PCP: Renato Dorothey HERO, NP   Brief Narrative:  [name] presents with worsening respiratory status and shortness of breath for 1 week noted to be hypoxic at intake with presumed COPD exacerbation, imaging confirms pneumonia and patient does meet sepsis criteria at intake.  Assessment & Plan:   Principal Problem:   Pneumonia  Severe sepsis secondary to right upper lobe pneumonia, POA Acute hypoxic respiratory failure, improving Lactic acidosis Failed outpatient Augmentin  Presented with leukocytosis, WBC 24K, tachycardia 113, lactic acidosis, 4.7. Continue nebs, oxygen, wean as tolerated (on room air at baseline)  Elevated troponin suspect demand ischemia in the setting of the above Positive troponin peaked at 51 and downtrending appropriately No evidence of acute ischemia on 12-lead EKG Monitor on telemetry   Multiple pulmonary nodules in the setting of tobacco abuse Noted on CT scan Recommend follow-up outpatient with repeat CT scan after resolution of the pneumonia.   Hyperglycemia Start heart healthy carb modified diet Lab Results  Component Value Date   HGBA1C 6.0 (H) 09/05/2023     AKI on CKD 3B Improving, appears to be back to his baseline after IV fluid hydration   Obesity BMI 34 Recommend weight loss outpatient regular physical activity and healthy diet.   Generalized weakness with fall No evidence of fractures on imaging PT OT assessment Fall precautions  DVT prophylaxis:  Code Status:   Code Status: Full Code Family Communication: None present  Status is: Inpatient  Dispo: The patient is from: Home              Anticipated d/c is to: Home              Anticipated d/c date is: 24 to 48 hours              Patient currently not medically stable for discharge  Consultants:  None  Procedures:  None  Antimicrobials:  Ceftriaxone   Subjective: No acute issues  or events overnight reports ongoing shortness of breath and cough but continues to improve.  Notable pleuritic chest pain with cough.  Objective: Vitals:   09/05/23 1945 09/05/23 2059 09/05/23 2200 09/06/23 0503  BP: 106/70   108/65  Pulse: (!) 106   85  Resp: 20   18  Temp: 98.2 F (36.8 C)   97.7 F (36.5 C)  TempSrc: Oral   Oral  SpO2: (!) 88% 91% 94% 93%  Weight:      Height:        Intake/Output Summary (Last 24 hours) at 09/06/2023 0724 Last data filed at 09/06/2023 0558 Gross per 24 hour  Intake 1050 ml  Output --  Net 1050 ml   Filed Weights   09/05/23 0505  Weight: 104.1 kg    Examination:  General:  Pleasantly resting in bed, No acute distress. HEENT:  Normocephalic atraumatic.  Sclerae nonicteric, noninjected.  Extraocular movements intact bilaterally. Neck:  Without mass or deformity.  Trachea is midline. Lungs: Scant bibasilar rales without overt rhonchi. Heart:  Regular rate and rhythm.  Without murmurs, rubs, or gallops. Abdomen:  Soft, nontender, nondistended.  Without guarding or rebound. Extremities: Without cyanosis, clubbing, or obvious deformity. Skin:  Warm and dry, no erythema.  Data Reviewed: I have personally reviewed following labs and imaging studies  CBC: Recent Labs  Lab 09/05/23 0126 09/05/23 0545 09/06/23 0454  WBC 24.9* 22.6* 22.3*  HGB 17.6* 17.1* 16.0  HCT 51.2 51.8 47.9  MCV 97.3 97.6 100.0  PLT 156 161 173   Basic Metabolic Panel: Recent Labs  Lab 09/05/23 0126 09/05/23 0545 09/06/23 0454  NA 132*  --  135  K 4.0  --  4.8  CL 89*  --  93*  CO2 25  --  31  GLUCOSE 186*  --  134*  BUN 44*  --  42*  CREATININE 1.75* 1.43* 1.19  CALCIUM  9.9  --  9.4   GFR: Estimated Creatinine Clearance: 69.5 mL/min (by C-G formula based on SCr of 1.19 mg/dL).  Cardiac Enzymes: Recent Labs  Lab 09/05/23 0126  CKTOTAL 790*   BNP (last 3 results) Recent Labs    09/05/23 0126  PROBNP 559.0*   HbA1C: Recent Labs     09/05/23 0545  HGBA1C 6.0*   CBG: Recent Labs  Lab 09/05/23 0735 09/05/23 1151 09/05/23 1651 09/05/23 1948  GLUCAP 152* 149* 139* 111*   Sepsis Labs: Recent Labs  Lab 09/05/23 0126 09/05/23 0515 09/05/23 0545 09/05/23 0924  PROCALCITON  --   --  2.34  --   LATICACIDVEN 4.7* 2.5*  --  2.3*    Recent Results (from the past 240 hours)  Resp panel by RT-PCR (RSV, Flu A&B, Covid) Anterior Nasal Swab     Status: None   Collection Time: 09/05/23  1:26 AM   Specimen: Anterior Nasal Swab  Result Value Ref Range Status   SARS Coronavirus 2 by RT PCR NEGATIVE NEGATIVE Final    Comment: (NOTE) SARS-CoV-2 target nucleic acids are NOT DETECTED.  The SARS-CoV-2 RNA is generally detectable in upper respiratory specimens during the acute phase of infection. The lowest concentration of SARS-CoV-2 viral copies this assay can detect is 138 copies/mL. A negative result does not preclude SARS-Cov-2 infection and should not be used as the sole basis for treatment or other patient management decisions. A negative result may occur with  improper specimen collection/handling, submission of specimen other than nasopharyngeal swab, presence of viral mutation(s) within the areas targeted by this assay, and inadequate number of viral copies(<138 copies/mL). A negative result must be combined with clinical observations, patient history, and epidemiological information. The expected result is Negative.  Fact Sheet for Patients:  BloggerCourse.com  Fact Sheet for Healthcare Providers:  SeriousBroker.it  This test is no t yet approved or cleared by the [place]  FDA and  has been authorized for detection and/or diagnosis of SARS-CoV-2 by FDA under an Emergency Use Authorization (EUA). This EUA will remain  in effect (meaning this test can be used) for the duration of the COVID-19 declaration under Section 564(b)(1) of the Act,  21 U.S.C.section 360bbb-3(b)(1), unless the authorization is terminated  or revoked sooner.       Influenza A by PCR NEGATIVE NEGATIVE Final   Influenza B by PCR NEGATIVE NEGATIVE Final    Comment: (NOTE) The Xpert Xpress SARS-CoV-2/FLU/RSV plus assay is intended as an aid in the diagnosis of influenza from Nasopharyngeal swab specimens and should not be used as a sole basis for treatment. Nasal washings and aspirates are unacceptable for Xpert Xpress SARS-CoV-2/FLU/RSV testing.  Fact Sheet for Patients: BloggerCourse.com  Fact Sheet for Healthcare Providers: SeriousBroker.it  This test is not yet approved or cleared by the [place]  FDA and has been authorized for detection and/or diagnosis of SARS-CoV-2 by FDA under an Emergency Use Authorization (EUA). This EUA will remain in effect (meaning this test can be used) for the duration of the COVID-19 declaration under Section 564(b)(1) of the  Act, 21 U.S.C. section 360bbb-3(b)(1), unless the authorization is terminated or revoked.     Resp Syncytial Virus by PCR NEGATIVE NEGATIVE Final    Comment: (NOTE) Fact Sheet for Patients: BloggerCourse.com  Fact Sheet for Healthcare Providers: SeriousBroker.it  This test is not yet approved or cleared by the [place]  FDA and has been authorized for detection and/or diagnosis of SARS-CoV-2 by FDA under an Emergency Use Authorization (EUA). This EUA will remain in effect (meaning this test can be used) for the duration of the COVID-19 declaration under Section 564(b)(1) of the Act, 21 U.S.C. section 360bbb-3(b)(1), unless the authorization is terminated or revoked.  Performed at [facility], [address] [place], [place] [address]          Radiology Studies: CT Angio Chest Pulmonary Embolism (PE) W or WO Contrast Result Date: 09/05/2023 CLINICAL  DATA:  Shortness of breath and chest pain. History of colon cancer. Masslike consolidation in the right lung on same-day radiograph EXAM: CT ANGIOGRAPHY CHEST CT ABDOMEN AND PELVIS WITH CONTRAST TECHNIQUE: Multidetector CT imaging of the chest was performed using the standard protocol during bolus administration of intravenous contrast. Multiplanar CT image reconstructions and MIPs were obtained to evaluate the vascular anatomy. Multidetector CT imaging of the abdomen and pelvis was performed using the standard protocol during bolus administration of intravenous contrast. RADIATION DOSE REDUCTION: This exam was performed according to the departmental dose-optimization program which includes automated exposure control, adjustment of the mA and/or kV according to patient size and/or use of iterative reconstruction technique. CONTRAST:  60mL OMNIPAQUE  IOHEXOL  350 MG/ML SOLN COMPARISON:  Same day radiographs; CT chest 01/12/2021 and CT abdomen pelvis 09/09/2016 FINDINGS: CTA CHEST FINDINGS Cardiovascular: Normal caliber thoracic aorta without dissection. Coronary artery and aortic atherosclerotic calcification. Negative for pulmonary embolism. Mediastinum/Nodes: Trachea and esophagus are unremarkable. Borderline enlarged 1.0 cm subcarinal node on series 4, image 139 similar to 01/12/2021. Lungs/Pleura: Moderate emphysema. Diffuse bronchial wall thickening. Patchy geographic airspace consolidation in the right upper lobe superimposed on a background of emphysema and bronchiectasis. This is favored due to pneumonia. 11 mm right upper lobe nodule on series 4, image 61 is unchanged from 2018 and benign. There are new pulmonary nodules in the left upper lobe. For example 6 mm nodule on 4/58 and 6 mm nodule on 4/78. No pleural effusion or pneumothorax. Musculoskeletal: No acute fracture. Review of the MIP images confirms the above findings. CT ABDOMEN and PELVIS FINDINGS Hepatobiliary: No acute abnormality. 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 Pancreas: Unremarkable. Spleen: Unremarkable. Adrenals/Urinary Tract: Normal adrenal glands. Nonspecific bilateral perinephric stranding similar to prior. Nonobstructing left nephrolithiasis. No hydronephrosis. Unremarkable bladder. Stomach/Bowel: No bowel obstruction or bowel wall thickening postoperative change of partial colectomy with ileal colonic anastomosis herniated into the large ventral upper abdominal wall hernia. No evidence of obstruction. Stomach is within normal limits. Vascular/Lymphatic: No significant vascular findings are present. No enlarged abdominal or pelvic lymph nodes. Reproductive: No acute abnormality. Other: No free intraperitoneal fluid or air. Musculoskeletal: No acute fracture. Review of the MIP images confirms the above findings. IMPRESSION: 1. Likely pneumonia in the right upper lobe superimposed on a background of emphysema and bronchiectasis. Follow-up in 6-8 weeks to resolution is recommended to exclude underlying malignancy. 2. New pulmonary nodules in the left upper lobe measuring up to 6 mm. These are favored infectious or inflammatory however are indeterminate. Fleischner criteria do not apply to patients with history of cancer. Continued  attention on follow-up. 3. No acute abnormality in the abdomen or pelvis. 4. Large ventral upper abdominal wall hernia containing a portion of the transverse colon without evidence of obstruction. 5. Nonobstructing left nephrolithiasis. Aortic Atherosclerosis (ICD10-I70.0) and Emphysema (ICD10-J43.9). Electronically Signed   By: Norman Gatlin M.D.   On: 09/05/2023 03:09   CT ABDOMEN PELVIS W CONTRAST Result Date: 09/05/2023 CLINICAL DATA:  Shortness of breath and chest pain. History of colon cancer. Masslike consolidation in the right lung on same-day radiograph EXAM: CT ANGIOGRAPHY CHEST CT ABDOMEN AND PELVIS WITH CONTRAST TECHNIQUE: Multidetector CT imaging of  the chest was performed using the standard protocol during bolus administration of intravenous contrast. Multiplanar CT image reconstructions and MIPs were obtained to evaluate the vascular anatomy. Multidetector CT imaging of the abdomen and pelvis was performed using the standard protocol during bolus administration of intravenous contrast. RADIATION DOSE REDUCTION: This exam was performed according to the departmental dose-optimization program which includes automated exposure control, adjustment of the mA and/or kV according to patient size and/or use of iterative reconstruction technique. CONTRAST:  60mL OMNIPAQUE  IOHEXOL  350 MG/ML SOLN COMPARISON:  Same day radiographs; CT chest 01/12/2021 and CT abdomen pelvis 09/09/2016 FINDINGS: CTA CHEST FINDINGS Cardiovascular: Normal caliber thoracic aorta without dissection. Coronary artery and aortic atherosclerotic calcification. Negative for pulmonary embolism. Mediastinum/Nodes: Trachea and esophagus are unremarkable. Borderline enlarged 1.0 cm subcarinal node on series 4, image 139 similar to 01/12/2021. Lungs/Pleura: Moderate emphysema. Diffuse bronchial wall thickening. Patchy geographic airspace consolidation in the right upper lobe superimposed on a background of emphysema and bronchiectasis. This is favored due to pneumonia. 11 mm right upper lobe nodule on series 4, image 61 is unchanged from 2018 and benign. There are new pulmonary nodules in the left upper lobe. For example 6 mm nodule on 4/58 and 6 mm nodule on 4/78. No pleural effusion or pneumothorax. Musculoskeletal: No acute fracture. Review of the MIP images confirms the above findings. CT ABDOMEN and PELVIS FINDINGS Hepatobiliary: No acute abnormality. Redemonstrated hemangioma in the left hepatic lobe on series 5, image 11 measuring 2.7 cm. Pancreas: Unremarkable. Spleen: Unremarkable. Adrenals/Urinary Tract: Normal adrenal glands. Nonspecific bilateral perinephric stranding similar to prior.  Nonobstructing left nephrolithiasis. No hydronephrosis. Unremarkable bladder. Stomach/Bowel: No bowel obstruction or bowel wall thickening postoperative change of partial colectomy with ileal colonic anastomosis herniated into the large ventral upper abdominal wall hernia. No evidence of obstruction. Stomach is within normal limits. Vascular/Lymphatic: No significant vascular findings are present. No enlarged abdominal or pelvic lymph nodes. Reproductive: No acute abnormality. Other: No free intraperitoneal fluid or air. Musculoskeletal: No acute fracture. Review of the MIP images confirms the above findings. IMPRESSION: 1. Likely pneumonia in the right upper lobe superimposed on a background of emphysema and bronchiectasis. Follow-up in 6-8 weeks to resolution is recommended to exclude underlying malignancy. 2. New pulmonary nodules in the left upper lobe measuring up to 6 mm. These are favored infectious or inflammatory however are indeterminate. Fleischner criteria do not apply to patients with history of cancer. Continued attention on follow-up. 3. No acute abnormality in the abdomen or pelvis. 4. Large ventral upper abdominal wall hernia containing a portion of the transverse colon without evidence of obstruction. 5. Nonobstructing left nephrolithiasis. Aortic Atherosclerosis (ICD10-I70.0) and Emphysema (ICD10-J43.9). Electronically Signed   By: Norman Gatlin M.D.   On: 09/05/2023 03:09   CT HEAD WO CONTRAST ( ) Result Date: 09/05/2023 CLINICAL DATA:  Headache, increasing frequency or severity EXAM: CT HEAD WITHOUT CONTRAST TECHNIQUE: Contiguous axial images were  obtained from the base of the skull through the vertex without intravenous contrast. RADIATION DOSE REDUCTION: This exam was performed according to the departmental dose-optimization program which includes automated exposure control, adjustment of the mA and/or kV according to patient size and/or use of iterative reconstruction technique.  COMPARISON:  CT head 01/02/2012 FINDINGS: Brain: No evidence of large-territorial acute infarction. No parenchymal hemorrhage. No mass lesion. No extra-axial collection. No mass effect or midline shift. No hydrocephalus. Basilar cisterns are patent. Vascular: No hyperdense vessel. Atherosclerotic calcifications are present within the cavernous internal carotid and vertebral arteries. Skull: No acute fracture or focal lesion. Sinuses/Orbits: Paranasal sinuses and mastoid air cells are clear. Bilateral lens replacement. Otherwise the orbits are unremarkable. Other: None. IMPRESSION: No acute intracranial abnormality. Electronically Signed   By: Morgane  Naveau M.D.   On: 09/05/2023 03:01   DG Chest Port 1 View Result Date: 09/05/2023 CLINICAL DATA:  Shortness of breath and chest pain EXAM: PORTABLE CHEST 1 VIEW COMPARISON:  Radiograph and CT 01/12/2021 FINDINGS: Masslike airspace opacity in the right mid to upper lung. Bibasilar atelectasis. No pleural effusion or pneumothorax. Left chest wall Port-A-Cath tip at the venous confluence with the SVC. IMPRESSION: Masslike airspace opacity in the right mid to upper lung. This could be due to pneumonia however malignancy is not excluded. Recommend further evaluation with CT. Electronically Signed   By: Norman Gatlin M.D.   On: 09/05/2023 01:43        Scheduled Meds:  budesonide -glycopyrrolate -formoterol   2 puff Inhalation BID   enoxaparin  (LOVENOX ) injection  50 mg Subcutaneous Q24H   insulin  aspart  0-5 Units Subcutaneous QHS   insulin  aspart  0-9 Units Subcutaneous TID WC   ipratropium-albuterol   3 mL Nebulization TID   tamsulosin   0.4 mg Oral BID   Continuous Infusions:  azithromycin  (ZITHROMAX ) 500 mg in sodium chloride  0.9 % 250 mL IVPB 500 mg (09/06/23 0558)   cefTRIAXone  (ROCEPHIN )  IV 2 g (09/06/23 0518)     LOS: 1 day   Time spent:  Elsie JAYSON Montclair, DO [facility]  If 7PM-7AM, please contact  night-coverage [email or web address]  09/06/2023, 7:24 AM

## 2023-09-06 NOTE — Plan of Care (Signed)

## 2023-09-07 ENCOUNTER — Inpatient Hospital Stay (HOSPITAL_COMMUNITY)

## 2023-09-07 DIAGNOSIS — J189 Pneumonia, unspecified organism: Secondary | ICD-10-CM | POA: Diagnosis not present

## 2023-09-07 LAB — CBC
HCT: 48.9 % (ref 39.0–52.0)
Hemoglobin: 16.2 g/dL (ref 13.0–17.0)
MCH: 33.1 pg (ref 26.0–34.0)
MCHC: 33.1 g/dL (ref 30.0–36.0)
MCV: 99.8 fL (ref 80.0–100.0)
Platelets: 174 K/uL (ref 150–400)
RBC: 4.9 MIL/uL (ref 4.22–5.81)
RDW: 14.1 % (ref 11.5–15.5)
WBC: 14.8 K/uL — ABNORMAL HIGH (ref 4.0–10.5)
nRBC: 0 % (ref 0.0–0.2)

## 2023-09-07 LAB — GLUCOSE, CAPILLARY
Glucose-Capillary: 138 mg/dL — ABNORMAL HIGH (ref 70–99)
Glucose-Capillary: 131 mg/dL — ABNORMAL HIGH (ref 70–99)
Glucose-Capillary: 141 mg/dL — ABNORMAL HIGH (ref 70–99)
Glucose-Capillary: 94 mg/dL (ref 70–99)
Glucose-Capillary: 124 mg/dL — ABNORMAL HIGH (ref 70–99)

## 2023-09-07 MED ORDER — AZITHROMYCIN 250 MG PO TABS
500.0000 mg | ORAL_TABLET | Freq: Every day | ORAL | Status: DC
  Administered 2023-09-08 – 2023-09-09 (×4): 500 mg via ORAL
  Filled 2023-09-07 (×2): qty 2

## 2023-09-07 MED ORDER — IPRATROPIUM-ALBUTEROL 0.5-2.5 (3) MG/3ML IN SOLN
3.0000 mL | Freq: Four times a day (QID) | RESPIRATORY_TRACT | Status: DC | PRN
  Administered 2023-09-07 – 2023-09-09 (×5): 3 mL via RESPIRATORY_TRACT
  Filled 2023-09-07 (×3): qty 3

## 2023-09-07 NOTE — Progress Notes (Signed)
 PROGRESS NOTE    [name]  FMW:[id number] DOB: Dec 01, 1955 DOA: 09/05/2023 PCP: Renato Dorothey HERO, NP   Brief Narrative:  Mr. Sease presents with worsening respiratory status and shortness of breath for 1 week noted to be hypoxic at intake with presumed COPD exacerbation, imaging confirms pneumonia and patient does meet sepsis criteria at intake.  Assessment & Plan:   Principal Problem:   Pneumonia  Severe sepsis secondary to right upper lobe pneumonia, POA Acute hypoxic respiratory failure, ongoing Lactic acidosis, resolved Failed outpatient Augmentin  Presented with leukocytosis, WBC 24K, tachycardia 113, lactic acidosis, 4.7. Continue nebs, oxygen, wean as tolerated (on room air at baseline) - Acutely worse this morning, repeat chest x-ray shows worsening airspace disease of right upper middle lobe  Elevated troponin suspect demand ischemia in the setting of the above Positive troponin peaked at 51 and downtrending appropriately No evidence of acute ischemia on 12-lead EKG Monitor on telemetry   Multiple pulmonary nodules in the setting of tobacco abuse Noted on CT scan Recommend follow-up outpatient with repeat CT scan after resolution of the pneumonia.   Hyperglycemia, A1C borderline -A1c 6.0, borderline diabetes -Discussed need for dietary and medication compliance   AKI on CKD 3B Improving, appears to be back to his baseline after IV fluid hydration(now discontinued)   Obesity BMI 34 Recommend weight loss outpatient regular physical activity and healthy diet.   Generalized weakness with fall No evidence of fractures on imaging PT OT assessment Fall precautions  DVT prophylaxis:  Code Status:   Code Status: Full Code Family Communication: None present  Status is: Inpatient  Dispo: The patient is from: Home              Anticipated d/c is to: Home              Anticipated d/c date is: 24 to 48 hours              Patient currently not medically  stable for discharge  Consultants:  None  Procedures:  None  Antimicrobials:  Ceftriaxone   Subjective: No acute issues or events overnight -acutely worsening respiratory status and fatigue this morning.  Denies fevers chills nausea vomiting diarrhea constipation, endorses poor appetite  Objective: Vitals:   09/07/23 0221 09/07/23 0551 09/07/23 0617 09/07/23 0617  BP:  118/60    Pulse:  (!) 118  (!) 115  Resp:  (!) 22 20   Temp:  97.9 F (36.6 C)    TempSrc:  Oral    SpO2: 95% 94%  92%  Weight:      Height:        Intake/Output Summary (Last 24 hours) at 09/07/2023 0802 Last data filed at 09/07/2023 0215 Gross per 24 hour  Intake 1190 ml  Output --  Net 1190 ml   Filed Weights   09/05/23 0505  Weight: 104.1 kg    Examination:  General:  Pleasantly resting in bed, No acute distress. HEENT:  Normocephalic atraumatic.  Sclerae nonicteric, noninjected.  Extraocular movements intact bilaterally. Neck:  Without mass or deformity.  Trachea is midline. Lungs: Scant bibasilar rales -right apical rhonchi noted Heart:  Regular rate and rhythm.  Without murmurs, rubs, or gallops. Abdomen:  Soft, nontender, nondistended.  Without guarding or rebound. Extremities: Without cyanosis, clubbing, or obvious deformity. Skin:  Warm and dry, no erythema.  Data Reviewed: I have personally reviewed following labs and imaging studies  CBC: Recent Labs  Lab 09/05/23 0126 09/05/23 0545 09/06/23 0454 09/07/23 0354  WBC  24.9* 22.6* 22.3* 14.8*  HGB 17.6* 17.1* 16.0 16.2  HCT 51.2 51.8 47.9 48.9  MCV 97.3 97.6 100.0 99.8  PLT 156 161 173 174   Basic Metabolic Panel: Recent Labs  Lab 09/05/23 0126 09/05/23 0545 09/06/23 0454  NA 132*  --  135  K 4.0  --  4.8  CL 89*  --  93*  CO2 25  --  31  GLUCOSE 186*  --  134*  BUN 44*  --  42*  CREATININE 1.75* 1.43* 1.19  CALCIUM  9.9  --  9.4   GFR: Estimated Creatinine Clearance: 69.5 mL/min (by C-G formula based on SCr of 1.19  mg/dL).  Cardiac Enzymes: Recent Labs  Lab 09/05/23 0126  CKTOTAL 790*   BNP (last 3 results) Recent Labs    09/05/23 0126  PROBNP 559.0*   HbA1C: Recent Labs    09/05/23 0545  HGBA1C 6.0*   CBG: Recent Labs  Lab 09/06/23 0729 09/06/23 1123 09/06/23 1552 09/06/23 2019 09/07/23 0726  GLUCAP 142* 138* 115* 118* 138*   Sepsis Labs: Recent Labs  Lab 09/05/23 0126 09/05/23 0515 09/05/23 0545 09/05/23 0924  PROCALCITON  --   --  2.34  --   LATICACIDVEN 4.7* 2.5*  --  2.3*    Recent Results (from the past 240 hours)  Resp panel by RT-PCR (RSV, Flu A&B, Covid) Anterior Nasal Swab     Status: None   Collection Time: 09/05/23  1:26 AM   Specimen: Anterior Nasal Swab  Result Value Ref Range Status   SARS Coronavirus 2 by RT PCR NEGATIVE NEGATIVE Final    Comment: (NOTE) SARS-CoV-2 target nucleic acids are NOT DETECTED.  The SARS-CoV-2 RNA is generally detectable in upper respiratory specimens during the acute phase of infection. The lowest concentration of SARS-CoV-2 viral copies this assay can detect is 138 copies/mL. A negative result does not preclude SARS-Cov-2 infection and should not be used as the sole basis for treatment or other patient management decisions. A negative result may occur with  improper specimen collection/handling, submission of specimen other than nasopharyngeal swab, presence of viral mutation(s) within the areas targeted by this assay, and inadequate number of viral copies(<138 copies/mL). A negative result must be combined with clinical observations, patient history, and epidemiological information. The expected result is Negative.  Fact Sheet for Patients:  BloggerCourse.com  Fact Sheet for Healthcare Providers:  SeriousBroker.it  This test is no t yet approved or cleared by the [place]  FDA and  has been authorized for detection and/or diagnosis of SARS-CoV-2 by FDA under an  Emergency Use Authorization (EUA). This EUA will remain  in effect (meaning this test can be used) for the duration of the COVID-19 declaration under Section 564(b)(1) of the Act, 21 U.S.C.section 360bbb-3(b)(1), unless the authorization is terminated  or revoked sooner.       Influenza A by PCR NEGATIVE NEGATIVE Final   Influenza B by PCR NEGATIVE NEGATIVE Final    Comment: (NOTE) The Xpert Xpress SARS-CoV-2/FLU/RSV plus assay is intended as an aid in the diagnosis of influenza from Nasopharyngeal swab specimens and should not be used as a sole basis for treatment. Nasal washings and aspirates are unacceptable for Xpert Xpress SARS-CoV-2/FLU/RSV testing.  Fact Sheet for Patients: BloggerCourse.com  Fact Sheet for Healthcare Providers: SeriousBroker.it  This test is not yet approved or cleared by the [place]  FDA and has been authorized for detection and/or diagnosis of SARS-CoV-2 by FDA under an Emergency Use Authorization (EUA). This EUA  will remain in effect (meaning this test can be used) for the duration of the COVID-19 declaration under Section 564(b)(1) of the Act, 21 U.S.C. section 360bbb-3(b)(1), unless the authorization is terminated or revoked.     Resp Syncytial Virus by PCR NEGATIVE NEGATIVE Final    Comment: (NOTE) Fact Sheet for Patients: BloggerCourse.com  Fact Sheet for Healthcare Providers: SeriousBroker.it  This test is not yet approved or cleared by the [place]  FDA and has been authorized for detection and/or diagnosis of SARS-CoV-2 by FDA under an Emergency Use Authorization (EUA). This EUA will remain in effect (meaning this test can be used) for the duration of the COVID-19 declaration under Section 564(b)(1) of the Act, 21 U.S.C. section 360bbb-3(b)(1), unless the authorization is terminated or revoked.  Performed at [facility], [address] [place], [place] [address]   Culture, blood (Routine X 2) w Reflex to ID Panel     Status: None (Preliminary result)   Collection Time: 09/05/23  1:34 AM   Specimen: BLOOD  Result Value Ref Range Status   Specimen Description   Final    BLOOD RIGHT ANTECUBITAL Performed at [facility], [address], [place], [place] [address]    Special Requests   Final    BOTTLES DRAWN AEROBIC AND ANAEROBIC Blood Culture adequate volume Performed at [facility], [address] [place], [place] [address]    Culture   Final    NO GROWTH < 24 HOURS Performed at [facility], [address], [place], [place] [address]    Report Status PENDING  Incomplete  Culture, blood (Routine X 2) w Reflex to ID Panel     Status: None (Preliminary result)   Collection Time: 09/05/23  1:36 AM   Specimen: BLOOD  Result Value Ref Range Status   Specimen Description   Final    BLOOD LEFT ANTECUBITAL Performed at [facility], [address], [place], [place] [address]    Special Requests   Final    BOTTLES DRAWN AEROBIC AND ANAEROBIC Blood Culture adequate volume Performed at [facility], [address] [place], [place] [address]    Culture   Final    NO GROWTH < 24 HOURS Performed at [facility], [address] [place], [place] [address]    Report Status PENDING  Incomplete         Radiology Studies: No results found.       Scheduled Meds:  budesonide -glycopyrrolate -formoterol   2 puff Inhalation BID   enoxaparin  (LOVENOX ) injection  50 mg Subcutaneous Q24H   insulin  aspart  0-5 Units Subcutaneous QHS   insulin  aspart  0-9 Units Subcutaneous TID WC   ipratropium-albuterol   3 mL Nebulization TID   tamsulosin   0.4 mg Oral BID   Continuous Infusions:  azithromycin  (ZITHROMAX ) 500 mg in sodium chloride  0.9 % 250 mL IVPB 500 mg (09/07/23 0615)   cefTRIAXone  (ROCEPHIN )   IV 2 g (09/07/23 0529)     LOS: 2 days   Time spent:  Elsie JAYSON Montclair, DO [facility]  If 7PM-7AM, please contact night-coverage [email or web address]  09/07/2023, 8:02 AM

## 2023-09-07 NOTE — Progress Notes (Signed)
 Occupational Therapy Treatment Patient Details Name: [name] MRN: [id number] DOB: [birth year] Today's Date: 09/07/2023   History of present illness Pt is a 68 y/o male admitted 09/05/23 for severe sepsis due to PNA, lactic acidosis, acute hypoxic respiratory failure, AKI and elevated troponin due to demand ischemia. Chest CT showed multiple pulmonary nodules to be followed up outpatient. 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   OT comments  Patient with limited progress today due to cardiopulmonary needs and MEWs of yellow today with OT monitoring HR and SpO2 closely with pt desat to 85% after supine to sit and extended time to recover to 90% on 2L with PLB.  After ambulation, SpO2 95% and HR: 105.  Pt asked to focus on his goal of ambulation today so ADLs not addressed much with energy conservation and breathing handouts provided with pt encouraged to read before next session.     Patient remains limited by decreased activity tolerance and all over body pain, along with deficits noted below. Pt continues to demonstrate good rehab potential and would benefit from continued skilled OT to increase safety and independence with ADLs and functional transfers to allow pt to return home safely and reduce caregiver burden and fall risk.       If plan is discharge home, recommend the following:  A little help with bathing/dressing/bathroom;Assistance with cooking/housework;A little help with walking and/or transfers   Equipment Recommendations       Recommendations for Other Services      Precautions / Restrictions Precautions Precautions: Fall;Other (comment) Recall of Precautions/Restrictions: Intact Precaution/Restrictions Comments: monitor O2 (does not wear at baseline). Mon HR Restrictions Weight Bearing Restrictions Per Provider Order: No       Mobility Bed Mobility Overal bed mobility: Needs Assistance Bed  Mobility: Supine to Sit     Supine to sit: Supervision, HOB elevated, Used rails          Transfers Overall transfer level: Needs assistance Equipment used: Rolling walker (2 wheels) Transfers: Sit to/from Stand, Bed to chair/wheelchair/BSC Sit to Stand: Contact guard assist           General transfer comment: verbal cues for safe hand placement with RW.  Per pt request, pt ambulated with RW 40' then moved to recliner. Cues for PLB as needed with breathing handout provided to reinforce.     Balance Overall balance assessment: Mild deficits observed, not formally tested                                         ADL either performed or assessed with clinical judgement   ADL                                       Functional mobility during ADLs: Contact guard assist;Rolling walker (2 wheels) General ADL Comments: Pt declined ADLs. OT brushed pt's hair after pt ambulated to allow for HR to come down. Pt asked to work on ambulation as he wanted to yesterday but did ont have the opportunity.    Extremity/Trunk Assessment Upper Extremity Assessment Upper Extremity Assessment: Overall WFL for tasks assessed;Right hand dominant   Lower Extremity Assessment Lower Extremity Assessment: Overall WFL for tasks assessed   Cervical / Trunk Assessment Cervical / Trunk Assessment: Normal  Vision   Vision Assessment?: No apparent visual deficits   Perception     Praxis     Communication Communication Communication: No apparent difficulties   Cognition Arousal: Alert Behavior During Therapy: WFL for tasks assessed/performed Cognition: No apparent impairments             OT - Cognition Comments: Pleasant with good awareness of deficits.                 Following commands: Intact        Cueing      Exercises      Shoulder Instructions       General Comments      Pertinent Vitals/ Pain       Pain Assessment Pain  Assessment: Faces Faces Pain Scale: Hurts little more Pain Location: All over from PNA and soreness from being in bed. Pain Descriptors / Indicators: Aching Pain Intervention(s): Monitored during session, Limited activity within patient's tolerance  Home Living                                          Prior Functioning/Environment              Frequency  Min 2X/week        Progress Toward Goals  OT Goals(current goals can now be found in the care plan section)  Progress towards OT goals: Progressing toward goals  Acute Rehab OT Goals Patient Stated Goal: Walk as much as possible. OT Goal Formulation: With patient Time For Goal Achievement: 09/19/23 Potential to Achieve Goals: Good  Plan      Co-evaluation                 AM-PAC OT 6 Clicks Daily Activity     Outcome Measure   Help from another person eating meals?: None Help from another person taking care of personal grooming?: A Little Help from another person toileting, which includes using toliet, bedpan, or urinal?: A Little Help from another person bathing (including washing, rinsing, drying)?: A Little Help from another person to put on and taking off regular upper body clothing?: A Little Help from another person to put on and taking off regular lower body clothing?: A Little 6 Click Score: 19    End of Session Equipment Utilized During Treatment: Gait belt;Oxygen;Rolling walker (2 wheels)  OT Visit Diagnosis: Other abnormalities of gait and mobility (R26.89);Unsteadiness on feet (R26.81);Muscle weakness (generalized) (M62.81)   Activity Tolerance Patient tolerated treatment well   Patient Left in chair;with call bell/phone within reach;with chair alarm set   Nurse Communication Other (comment) (RN approved OT visit with yellow MEWS as long as monitoring.)        Time: 8651-8582 OT Time Calculation (min): 29 min  Charges: OT General Charges $OT Visit: 1 Visit OT  Treatments $Therapeutic Activity: 23-37 mins  Delon, OT Acute Rehab Services Office: [phone] 09/07/2023   Delon Falter 09/07/2023, 2:33 PM

## 2023-09-07 NOTE — Plan of Care (Signed)

## 2023-09-08 DIAGNOSIS — J189 Pneumonia, unspecified organism: Secondary | ICD-10-CM | POA: Diagnosis not present

## 2023-09-08 LAB — CBC
HCT: 46.7 % (ref 39.0–52.0)
Hemoglobin: 15 g/dL (ref 13.0–17.0)
MCH: 32.1 pg (ref 26.0–34.0)
MCHC: 32.1 g/dL (ref 30.0–36.0)
MCV: 100 fL (ref 80.0–100.0)
Platelets: 196 K/uL (ref 150–400)
RBC: 4.67 MIL/uL (ref 4.22–5.81)
RDW: 14.4 % (ref 11.5–15.5)
WBC: 19.1 K/uL — ABNORMAL HIGH (ref 4.0–10.5)
nRBC: 0 % (ref 0.0–0.2)

## 2023-09-08 LAB — GLUCOSE, CAPILLARY
Glucose-Capillary: 121 mg/dL — ABNORMAL HIGH (ref 70–99)
Glucose-Capillary: 170 mg/dL — ABNORMAL HIGH (ref 70–99)
Glucose-Capillary: 176 mg/dL — ABNORMAL HIGH (ref 70–99)
Glucose-Capillary: 174 mg/dL — ABNORMAL HIGH (ref 70–99)

## 2023-09-08 LAB — MRSA NEXT GEN BY PCR, NASAL: MRSA by PCR Next Gen: NOT DETECTED

## 2023-09-08 MED ORDER — PREDNISONE 20 MG PO TABS
20.0000 mg | ORAL_TABLET | Freq: Two times a day (BID) | ORAL | Status: DC
  Administered 2023-09-08 – 2023-09-09 (×6): 20 mg via ORAL
  Filled 2023-09-08 (×3): qty 1

## 2023-09-08 MED ORDER — ORAL CARE MOUTH RINSE: 15.0000 mL | OROMUCOSAL | Status: DC | PRN

## 2023-09-08 MED ORDER — METHYLPREDNISOLONE SODIUM SUCC 125 MG IJ SOLR
60.0000 mg | Freq: Once | INTRAMUSCULAR | Status: AC
  Administered 2023-09-08 (×2): 60 mg via INTRAVENOUS
  Filled 2023-09-08: qty 2

## 2023-09-08 NOTE — Progress Notes (Signed)
 PROGRESS NOTE    [name]  FMW:[id number] DOB: 1955/07/14 DOA: 09/05/2023 PCP: Renato Dorothey HERO, NP   Brief Narrative:  [name] presents with worsening respiratory status and shortness of breath for 1 week noted to be hypoxic at intake with presumed COPD exacerbation, imaging confirms pneumonia and patient does meet sepsis criteria at intake.  Assessment & Plan:   Principal Problem:   Pneumonia  Severe sepsis secondary to right upper lobe pneumonia, POA Acute hypoxic respiratory failure, ongoing Lactic acidosis, resolved - Failed outpatient Augmentin  - Presented with leukocytosis tachycardia and lactic acidosis. - Continue nebs, oxygen, wean as tolerated (on room air at baseline) - Add methylpred- transition to prednisone  20mg  BID later today  Elevated troponin suspect demand ischemia in the setting of the above Positive troponin peaked at 51 and downtrending appropriately No evidence of acute ischemia on 12-lead EKG Monitor on telemetry   Multiple pulmonary nodules in the setting of tobacco abuse Noted on CT scan Recommend follow-up outpatient with repeat CT scan after resolution of the pneumonia.   Hyperglycemia, A1C borderline -A1c 6.0, borderline diabetes -Discussed need for dietary and medication compliance   AKI on CKD 3B Improving, appears to be back to his baseline after IV fluid hydration(now discontinued)   Obesity BMI 34 Recommend weight loss outpatient regular physical activity and healthy diet.   Generalized weakness with fall No evidence of fractures on imaging PT OT assessment Fall precautions  DVT prophylaxis:  Code Status:   Code Status: Full Code Family Communication: None present  Status is: Inpatient  Dispo: The patient is from: Home              Anticipated d/c is to: Home              Anticipated d/c date is: 24 to 48 hours              Patient currently not medically stable for discharge  Consultants:   None  Procedures:  None  Antimicrobials:  Ceftriaxone   Subjective: No acute issues or events overnight -respiratory status continues to wax and wane, moderately improved from previous but not yet back to baseline denies nausea vomiting diarrhea constipation any fevers chills chest pain  Objective: Vitals:   09/08/23 0242 09/08/23 0430 09/08/23 0454 09/08/23 0607  BP:  (!) 98/48 101/62 (!) 103/58  Pulse:  87 90 94  Resp:  17  19  Temp:  98.3 F (36.8 C)  98.3 F (36.8 C)  TempSrc:  Oral  Oral  SpO2: 90% 93% 90% 91%  Weight:      Height:        Intake/Output Summary (Last 24 hours) at 09/08/2023 0733 Last data filed at 09/08/2023 0600 Gross per 24 hour  Intake 600 ml  Output --  Net 600 ml   Filed Weights   09/05/23 0505  Weight: 104.1 kg    Examination:  General:  Pleasantly resting in bed, No acute distress. HEENT:  Normocephalic atraumatic.  Sclerae nonicteric, noninjected.  Extraocular movements intact bilaterally. Neck:  Without mass or deformity.  Trachea is midline. Lungs: Scant bibasilar rales -right apical rhonchi noted Heart:  Regular rate and rhythm.  Without murmurs, rubs, or gallops. Abdomen:  Soft, nontender, nondistended.  Without guarding or rebound. Extremities: Without cyanosis, clubbing, or obvious deformity. Skin:  Warm and dry, no erythema.  Data Reviewed: I have personally reviewed following labs and imaging studies  CBC: Recent Labs  Lab 09/05/23 0126 09/05/23 0545 09/06/23 0454 09/07/23  0354 09/08/23 0346  WBC 24.9* 22.6* 22.3* 14.8* 19.1*  HGB 17.6* 17.1* 16.0 16.2 15.0  HCT 51.2 51.8 47.9 48.9 46.7  MCV 97.3 97.6 100.0 99.8 100.0  PLT 156 161 173 174 196   Basic Metabolic Panel: Recent Labs  Lab 09/05/23 0126 09/05/23 0545 09/06/23 0454  NA 132*  --  135  K 4.0  --  4.8  CL 89*  --  93*  CO2 25  --  31  GLUCOSE 186*  --  134*  BUN 44*  --  42*  CREATININE 1.75* 1.43* 1.19  CALCIUM  9.9  --  9.4   GFR: Estimated  Creatinine Clearance: 69.5 mL/min (by C-G formula based on SCr of 1.19 mg/dL).  Cardiac Enzymes: Recent Labs  Lab 09/05/23 0126  CKTOTAL 790*   BNP (last 3 results) Recent Labs    09/05/23 0126  PROBNP 559.0*   HbA1C: No results for input(s): HGBA1C in the last 72 hours.  CBG: Recent Labs  Lab 09/07/23 0726 09/07/23 0948 09/07/23 1116 09/07/23 1630 09/07/23 2134  GLUCAP 138* 131* 141* 94 124*   Sepsis Labs: Recent Labs  Lab 09/05/23 0126 09/05/23 0515 09/05/23 0545 09/05/23 0924  PROCALCITON  --   --  2.34  --   LATICACIDVEN 4.7* 2.5*  --  2.3*    Recent Results (from the past 240 hours)  Resp panel by RT-PCR (RSV, Flu A&B, Covid) Anterior Nasal Swab     Status: None   Collection Time: 09/05/23  1:26 AM   Specimen: Anterior Nasal Swab  Result Value Ref Range Status   SARS Coronavirus 2 by RT PCR NEGATIVE NEGATIVE Final    Comment: (NOTE) SARS-CoV-2 target nucleic acids are NOT DETECTED.  The SARS-CoV-2 RNA is generally detectable in upper respiratory specimens during the acute phase of infection. The lowest concentration of SARS-CoV-2 viral copies this assay can detect is 138 copies/mL. A negative result does not preclude SARS-Cov-2 infection and should not be used as the sole basis for treatment or other patient management decisions. A negative result may occur with  improper specimen collection/handling, submission of specimen other than nasopharyngeal swab, presence of viral mutation(s) within the areas targeted by this assay, and inadequate number of viral copies(<138 copies/mL). A negative result must be combined with clinical observations, patient history, and epidemiological information. The expected result is Negative.  Fact Sheet for Patients:  BloggerCourse.com  Fact Sheet for Healthcare Providers:  SeriousBroker.it  This test is no t yet approved or cleared by the [place]  FDA and   has been authorized for detection and/or diagnosis of SARS-CoV-2 by FDA under an Emergency Use Authorization (EUA). This EUA will remain  in effect (meaning this test can be used) for the duration of the COVID-19 declaration under Section 564(b)(1) of the Act, 21 U.S.C.section 360bbb-3(b)(1), unless the authorization is terminated  or revoked sooner.       Influenza A by PCR NEGATIVE NEGATIVE Final   Influenza B by PCR NEGATIVE NEGATIVE Final    Comment: (NOTE) The Xpert Xpress SARS-CoV-2/FLU/RSV plus assay is intended as an aid in the diagnosis of influenza from Nasopharyngeal swab specimens and should not be used as a sole basis for treatment. Nasal washings and aspirates are unacceptable for Xpert Xpress SARS-CoV-2/FLU/RSV testing.  Fact Sheet for Patients: BloggerCourse.com  Fact Sheet for Healthcare Providers: SeriousBroker.it  This test is not yet approved or cleared by the [place]  FDA and has been authorized for detection and/or diagnosis of SARS-CoV-2 by  FDA under an Emergency Use Authorization (EUA). This EUA will remain in effect (meaning this test can be used) for the duration of the COVID-19 declaration under Section 564(b)(1) of the Act, 21 U.S.C. section 360bbb-3(b)(1), unless the authorization is terminated or revoked.     Resp Syncytial Virus by PCR NEGATIVE NEGATIVE Final    Comment: (NOTE) Fact Sheet for Patients: BloggerCourse.com  Fact Sheet for Healthcare Providers: SeriousBroker.it  This test is not yet approved or cleared by the [place]  FDA and has been authorized for detection and/or diagnosis of SARS-CoV-2 by FDA under an Emergency Use Authorization (EUA). This EUA will remain in effect (meaning this test can be used) for the duration of the COVID-19 declaration under Section 564(b)(1) of the Act, 21 U.S.C. section 360bbb-3(b)(1),  unless the authorization is terminated or revoked.  Performed at [facility], [address] [place], [place] [address]   Culture, blood (Routine X 2) w Reflex to ID Panel     Status: None (Preliminary result)   Collection Time: 09/05/23  1:34 AM   Specimen: BLOOD  Result Value Ref Range Status   Specimen Description   Final    BLOOD RIGHT ANTECUBITAL Performed at [facility], [address] [place], [place] [address]    Special Requests   Final    BOTTLES DRAWN AEROBIC AND ANAEROBIC Blood Culture adequate volume Performed at [facility], [address], [place], [place] [address]    Culture   Final    NO GROWTH 3 DAYS Performed at [facility], [address], [place], [place] [address]    Report Status PENDING  Incomplete  Culture, blood (Routine X 2) w Reflex to ID Panel     Status: None (Preliminary result)   Collection Time: 09/05/23  1:36 AM   Specimen: BLOOD  Result Value Ref Range Status   Specimen Description   Final    BLOOD LEFT ANTECUBITAL Performed at [facility], [address], [place], [place] [address]    Special Requests   Final    BOTTLES DRAWN AEROBIC AND ANAEROBIC Blood Culture adequate volume Performed at [facility], [address], [place], [place] [address]    Culture   Final    NO GROWTH 3 DAYS Performed at [facility], [address] [place], [place] [address]    Report Status PENDING  Incomplete         Radiology Studies: DG Chest Port 1 View Result Date: 09/07/2023 CLINICAL DATA:  Hypoxia, chest pain EXAM: PORTABLE CHEST 1 VIEW COMPARISON:  Radiograph and CT 09/05/2023 FINDINGS: Increased airspace opacity in the right mid and upper lobe concerning for progression of pneumonia. The left lung is clear. No pleural effusion or pneumothorax. Stable cardiomediastinal silhouette. IMPRESSION: Increased airspace opacity in the  right mid and upper lobe concerning for progression of pneumonia. Follow-up to resolution is recommended to exclude malignancy. Electronically Signed   By: Norman Gatlin M.D.   On: 09/07/2023 11:17         Scheduled Meds:  azithromycin   500 mg Oral Daily   budesonide -glycopyrrolate -formoterol   2 puff Inhalation BID   enoxaparin  (LOVENOX ) injection  50 mg Subcutaneous Q24H   insulin  aspart  0-5 Units Subcutaneous QHS   insulin  aspart  0-9 Units Subcutaneous TID WC   ipratropium-albuterol   3 mL Nebulization TID   tamsulosin   0.4 mg Oral BID   Continuous Infusions:  cefTRIAXone  (ROCEPHIN )  IV 2 g (09/08/23 0600)  LOS: 3 days   Time spent:  Elsie JAYSON [place], DO [facility]  If 7PM-7AM, please contact night-coverage [email or web address]  09/08/2023, 7:33 AM

## 2023-09-08 NOTE — Progress Notes (Signed)
 SATURATION QUALIFICATIONS: (This note is used to comply with regulatory documentation for home oxygen)  Patient Saturations on Room Air at Rest = 88%  Patient Saturations on Room Air while Ambulating = 84% Pt unable to recover > 88% on RA s/p gait in 1:45 and 1 L/min supplemental O2 and pt required an additional 1:45 to recover to 90% at rest seated in recliner   Patient Saturations on 1 Liters of oxygen while Ambulating = 87% Requiring 23s to recover to 90% on 1 L/min once seated in recliner cues for breathing strategies.    [place], PT Acute Rehab

## 2023-09-08 NOTE — Progress Notes (Signed)
 Physical Therapy Treatment Patient Details Name: [name] MRN: [id number] DOB: 1955-08-27 Today's Date: 09/08/2023   History of Present Illness Pt is a 68 y/o male admitted 09/05/23 for severe sepsis due to PNA, lactic acidosis, acute hypoxic respiratory failure, AKI and elevated troponin due to demand ischemia. Chest CT showed multiple pulmonary nodules to be followed up outpatient. 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    PT Comments   Pt admitted with above diagnosis.  Pt currently with functional limitations due to the deficits listed below (see PT Problem List). Pt seated in recliner when PT arrived. Pt indicated feeling sleepy from anti-nausea medication but willing to participate with therapy. Pt on RA, had removed Sequoyah tube prior to PT arrival. Pt indicated no SOB, no apparent use of accessory mm for breathing. Pt sates normal range for O2 91-92% on RA. Pt 88% on RA at rest. PT assessed pt walking O2 on RA and on 1 L/min no reports of SOB however pt desaturated with exertion with both amb bouts, please see walking O2 notes. Pt is S for sit to stand  from recliner, min cues for RW management, gait tasks with S and min cues for posture and proper distance from RW as well as coordinated breathing stratagies 80 feet x 2 with seated therapeutic rest break between amb bouts. Pt left seated in recliner, all needs in place, family present and on 2 L/min with pt saturating at 92-93%. Pt will benefit from acute skilled PT to increase their independence and safety with mobility to allow discharge.      If plan is discharge home, recommend the following:     Can travel by private vehicle        Equipment Recommendations  Rollator (4 wheels) (may benefit for endurance)    Recommendations for Other Services       Precautions / Restrictions Precautions Precautions: Fall;Other (comment) Recall of Precautions/Restrictions:  Intact Precaution/Restrictions Comments: monitor O2 (does not wear at baseline). Mon HR Restrictions Weight Bearing Restrictions Per Provider Order: No     Mobility  Bed Mobility               General bed mobility comments: pt in recliner    Transfers Overall transfer level: Needs assistance Equipment used: Rolling walker (2 wheels) Transfers: Sit to/from Stand, Bed to chair/wheelchair/BSC Sit to Stand: Supervision           General transfer comment: verbal cues for safe hand placement with RW.    Ambulation/Gait Ambulation/Gait assistance: Supervision Gait Distance (Feet): 80 Feet Assistive device: Rolling walker (2 wheels) Gait Pattern/deviations: Step-through pattern, Decreased stride length, Trunk flexed Gait velocity: decreased     General Gait Details: verbal cues for use of RW and posture; cues for  coordinated breathing   Stairs             Wheelchair Mobility     Tilt Bed    Modified Rankin (Stroke Patients Only)       Balance Overall balance assessment: Mild deficits observed, not formally tested Sitting-balance support: No upper extremity supported, Feet supported Sitting balance-Leahy Scale: Good     Standing balance support: No upper extremity supported Standing balance-Leahy Scale: Fair                              Musician Communication: No apparent difficulties  Cognition Arousal: Alert Behavior During  Therapy: WFL for tasks assessed/performed   PT - Cognitive impairments: No apparent impairments                         Following commands: Intact      Cueing Cueing Techniques: Verbal cues  Exercises      General Comments        Pertinent Vitals/Pain Pain Assessment Pain Assessment: No/denies pain    Home Living                          Prior Function            PT Goals (current goals can now be found in the care plan section) Acute Rehab PT  Goals PT Goal Formulation: With patient Time For Goal Achievement: 09/19/23 Potential to Achieve Goals: Good Progress towards PT goals: Progressing toward goals    Frequency    Min 2X/week      PT Plan      Co-evaluation              AM-PAC PT 6 Clicks Mobility   Outcome Measure  Help needed turning from your back to your side while in a flat bed without using bedrails?: A Little Help needed moving from lying on your back to sitting on the side of a flat bed without using bedrails?: A Little Help needed moving to and from a bed to a chair (including a wheelchair)?: A Little Help needed standing up from a chair using your arms (e.g., wheelchair or bedside chair)?: None Help needed to walk in hospital room?: A Little Help needed climbing 3-5 steps with a railing? : A Little 6 Click Score: 19    End of Session Equipment Utilized During Treatment: Gait belt;Oxygen Activity Tolerance: Patient tolerated treatment well Patient left: in chair;with call bell/phone within reach;with family/visitor present Nurse Communication: Mobility status;Other (comment) (O2 findings) PT Visit Diagnosis: Difficulty in walking, not elsewhere classified (R26.2)     Time: 8979-8956 PT Time Calculation (min) (ACUTE ONLY): 23 min  Charges:    $Gait Training: 8-22 mins $Therapeutic Activity: 8-22 mins PT General Charges $$ ACUTE PT VISIT: 1 Visit                     Alan, PT Acute Rehab    [name] 09/08/2023, 2:32 PM

## 2023-09-09 ENCOUNTER — Other Ambulatory Visit (HOSPITAL_BASED_OUTPATIENT_CLINIC_OR_DEPARTMENT_OTHER): Payer: Self-pay

## 2023-09-09 DIAGNOSIS — J189 Pneumonia, unspecified organism: Secondary | ICD-10-CM | POA: Diagnosis not present

## 2023-09-09 LAB — BASIC METABOLIC PANEL WITH GFR
Anion gap: 10 (ref 5–15)
BUN: 29 mg/dL — ABNORMAL HIGH (ref 8–23)
CO2: 33 mmol/L — ABNORMAL HIGH (ref 22–32)
Calcium: 8.8 mg/dL — ABNORMAL LOW (ref 8.9–10.3)
Chloride: 88 mmol/L — ABNORMAL LOW (ref 98–111)
Creatinine, Ser: 1.07 mg/dL (ref 0.61–1.24)
GFR, Estimated: >60 mL/min (ref >60)
Glucose, Bld: 140 mg/dL — ABNORMAL HIGH (ref 70–99)
Potassium: 4.6 mmol/L (ref 3.5–5.1)
Sodium: 131 mmol/L — ABNORMAL LOW (ref 135–145)

## 2023-09-09 LAB — EXPECTORATED SPUTUM ASSESSMENT W GRAM STAIN, RFLX TO RESP C

## 2023-09-09 LAB — CBC
HCT: 43.2 % (ref 39.0–52.0)
Hemoglobin: 14.3 g/dL (ref 13.0–17.0)
MCH: 32.3 pg (ref 26.0–34.0)
MCHC: 33.1 g/dL (ref 30.0–36.0)
MCV: 97.5 fL (ref 80.0–100.0)
Platelets: 259 K/uL (ref 150–400)
RBC: 4.43 MIL/uL (ref 4.22–5.81)
RDW: 14.1 % (ref 11.5–15.5)
WBC: 28 K/uL — ABNORMAL HIGH (ref 4.0–10.5)
nRBC: 0 % (ref 0.0–0.2)

## 2023-09-09 LAB — GLUCOSE, CAPILLARY
Glucose-Capillary: 204 mg/dL — ABNORMAL HIGH (ref 70–99)
Glucose-Capillary: 160 mg/dL — ABNORMAL HIGH (ref 70–99)

## 2023-09-09 MED ORDER — PREDNISONE 20 MG PO TABS: 40.0000 mg | ORAL_TABLET | Freq: Every day | ORAL | 0 refills | Status: AC

## 2023-09-09 MED ORDER — LEVOFLOXACIN 750 MG PO TABS: 750.0000 mg | ORAL_TABLET | Freq: Every day | ORAL | 0 refills | Status: AC

## 2023-09-09 MED ORDER — GUAIFENESIN ER 600 MG PO TB12
1200.0000 mg | ORAL_TABLET | Freq: Two times a day (BID) | ORAL | Status: DC
  Administered 2023-09-09 (×2): 1200 mg via ORAL
  Filled 2023-09-09: qty 2

## 2023-09-09 MED ORDER — IPRATROPIUM-ALBUTEROL 0.5-2.5 (3) MG/3ML IN SOLN: 3.0000 mL | Freq: Four times a day (QID) | RESPIRATORY_TRACT | 1 refills | Status: AC | PRN

## 2023-09-09 MED ORDER — ALUM & MAG HYDROXIDE-SIMETH 200-200-20 MG/5ML PO SUSP
30.0000 mL | ORAL | Status: DC | PRN
  Administered 2023-09-09 (×4): 30 mL via ORAL
  Filled 2023-09-09 (×2): qty 30

## 2023-09-09 MED ORDER — GUAIFENESIN ER 600 MG PO TB12: 1200.0000 mg | ORAL_TABLET | Freq: Two times a day (BID) | ORAL | 0 refills | Status: DC

## 2023-09-09 NOTE — Plan of Care (Signed)

## 2023-09-09 NOTE — Progress Notes (Signed)
 SATURATION QUALIFICATIONS: (This note is used to comply with regulatory documentation for home oxygen)  Patient Saturations on Room Air at Rest = 91%  Patient Saturations on Room Air while Ambulating = 87%  Patient Saturations on 1 Liters of oxygen while Ambulating = 90-92%  Please briefly explain why patient needs home oxygen:Pt needs oxygen while ambulating to maintain O2 92% and above.

## 2023-09-09 NOTE — Discharge Summary (Signed)
 Physician Discharge Summary   Patient: [name] MRN: [id number] DOB: [birth year]  Admit date:     09/05/2023  Discharge date: 09/09/23  Discharge Physician: Owen DELENA Lore   PCP: Renato Dorothey HERO, NP   Recommendations at discharge:   Needs follow up for resolution of PNA Needs CT to follow pulmonary nodules.   Discharge Diagnoses: Principal Problem:   Pneumonia  Resolved Problems:   * No resolved hospital problems. *  Hospital Course: 68 year old with past medical history significant for COPD, colon cancer status post colon surgery and chemotherapy, CKD 3A, current smoker, presents with worsening shortness of breath for the last 7 to 10 days.  Reports persistent cough and generalized weakness.  CT chest was negative for PE, showed findings suggestive of right upper lobe pneumonia, multiple pulmonary nodules.  He failed Augmentin  as an outpatient.   Patient admitted with sepsis secondary to right upper lobe pneumonia, acute hypoxic respiratory failure.   Assessment and Plan: 1-Severe sepsis secondary to right upper lobe Pneumonia, POA Acute hypoxic respiratory failure Lactic acidosis resolved - Patient failed outpatient Augmentin . - Presented  with leukocytosis, tachycardia, lactic acidosis, hypoxia - Patient respiratory status worsened on 8/10, he was started on a steroid.  His x-ray showed worsening airspace disease right upper lobe -Continue DuoNeb - Continue prednisone  -On IV ceftriaxone  azithro.  MRSA negative.   he feels much better, ready to go home. Now on RA at rest., plan to arrange home oxygen. WBC increased in setting of IV steroids.  Discharge on Levaquin  7 days, prednisone  for 4 days.   Elevated troponin suspect demand ischemia in the setting of  Pneumonia  -Troponin peaked at 51, trending down.     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   albuterol  108 (90 Base) MCG/ACT inhaler Commonly known as: VENTOLIN  HFA Inhale 2 puffs into the lungs every 4 (four) hours as needed.   b complex vitamins capsule Take 1 capsule by mouth daily.   calcium  carbonate 500 MG chewable tablet Commonly known as: TUMS - dosed in mg elemental calcium  Chew 500-1,000 mg by mouth as needed for indigestion or heartburn.   chlorthalidone  25 MG tablet Commonly known as: HYGROTON  Take 1 tablet (25 mg total) by mouth every morning with food.   guaiFENesin  600 MG 12 hr tablet Commonly known as: MUCINEX  Take 2 tablets (1,200 mg total) by mouth 2 (two) times daily.   ipratropium-albuterol  0.5-2.5 (3) MG/3ML Soln Commonly known as: DUONEB Inhale 3 mLs into the lungs every 6 (six) hours as needed. What changed: reasons to take this   levofloxacin  750 MG tablet Commonly known as: Levaquin  Take 1 tablet (750 mg total) by mouth daily for 7 days.   linaclotide 145 MCG Caps capsule Commonly known as: LINZESS Take 145 mcg  by mouth every other day.   Magnesium 300 MG Tabs Take 600 mg by mouth daily.   oxyCODONE -acetaminophen  5-325 MG tablet Commonly known as: PERCOCET/ROXICET Take 1 tablet by mouth every 6 (six) hours as needed for severe pain or moderate pain. What changed: when to take this   predniSONE  20 MG tablet Commonly known as: DELTASONE  Take 2 tablets (40 mg total) by mouth daily with  breakfast for 4 days.   tamsulosin  0.4 MG Caps capsule Commonly known as: FLOMAX  Take 1 capsule (0.4 mg total) by mouth 2 (two) times daily.   Trelegy Ellipta 100-62.5-25 MCG/ACT Aepb Generic drug: Fluticasone-Umeclidin-Vilant Inhale 1 puff into the lungs daily.   Vitamin D3 125 MCG (5000 UT) Tabs Take 10,000 Units by mouth daily.               Durable Medical Equipment  (From admission, onward)           Start     Ordered   09/09/23 1119  For home use only DME oxygen  Once       Question Answer Comment  Length of Need 6 Months   Mode or (Route) Nasal cannula   Liters per Minute 2   Frequency Continuous (stationary and portable oxygen unit needed)   Oxygen delivery system Gas      09/09/23 1118            Discharge Exam: Filed Weights   09/05/23 0505  Weight: 104.1 kg   General; NAD  Condition at discharge: stable  The results of significant diagnostics from this hospitalization (including imaging, microbiology, ancillary and laboratory) are listed below for reference.   Imaging Studies: DG Chest Port 1 View Result Date: 09/07/2023 CLINICAL DATA:  Hypoxia, chest pain EXAM: PORTABLE CHEST 1 VIEW COMPARISON:  Radiograph and CT 09/05/2023 FINDINGS: Increased airspace opacity in the right mid and upper lobe concerning for progression of pneumonia. The left lung is clear. No pleural effusion or pneumothorax. Stable cardiomediastinal silhouette. IMPRESSION: Increased airspace opacity in the right mid and upper lobe concerning for progression of pneumonia. Follow-up to resolution is recommended to exclude malignancy. Electronically Signed   By: Norman Gatlin M.D.   On: 09/07/2023 11:17   CT Angio Chest Pulmonary Embolism (PE) W or WO Contrast Result Date: 09/05/2023 CLINICAL DATA:  Shortness of breath and chest pain. History of colon cancer. Masslike consolidation in the right lung on same-day radiograph EXAM: CT ANGIOGRAPHY CHEST CT ABDOMEN AND PELVIS WITH  CONTRAST TECHNIQUE: Multidetector CT imaging of the chest was performed using the standard protocol during bolus administration of intravenous contrast. Multiplanar CT image reconstructions and MIPs were obtained to evaluate the vascular anatomy. Multidetector CT imaging of the abdomen and pelvis was performed using the standard protocol during bolus administration of intravenous contrast. RADIATION DOSE REDUCTION: This exam was performed according to the departmental dose-optimization program which includes automated exposure control, adjustment of the mA and/or kV according to patient size and/or use of iterative reconstruction technique. CONTRAST:  60mL OMNIPAQUE  IOHEXOL  350 MG/ML SOLN COMPARISON:  Same day radiographs; CT chest 01/12/2021 and CT abdomen pelvis 09/09/2016 FINDINGS: CTA CHEST FINDINGS Cardiovascular: Normal caliber thoracic aorta without dissection. Coronary artery and aortic atherosclerotic calcification. Negative for pulmonary embolism. Mediastinum/Nodes: Trachea and esophagus are unremarkable. Borderline enlarged 1.0 cm subcarinal node on series 4, image 139 similar to 01/12/2021. Lungs/Pleura: Moderate emphysema. Diffuse bronchial wall thickening. Patchy geographic airspace consolidation in the right upper lobe superimposed on a background of emphysema and bronchiectasis. This  is favored due to pneumonia. 11 mm right upper lobe nodule on series 4, image 61 is unchanged from 2018 and benign. There are new pulmonary nodules in the left upper lobe. For example 6 mm nodule on 4/58 and 6 mm nodule on 4/78. No pleural effusion or pneumothorax. Musculoskeletal: No acute fracture. Review of the MIP images confirms the above findings. CT ABDOMEN and PELVIS FINDINGS Hepatobiliary: No acute abnormality. Redemonstrated hemangioma in the left hepatic lobe on series 5, image 11 measuring 2.7 cm. Pancreas: Unremarkable. Spleen: Unremarkable. Adrenals/Urinary Tract: Normal adrenal glands. Nonspecific  bilateral perinephric stranding similar to prior. Nonobstructing left nephrolithiasis. No hydronephrosis. Unremarkable bladder. Stomach/Bowel: No bowel obstruction or bowel wall thickening postoperative change of partial colectomy with ileal colonic anastomosis herniated into the large ventral upper abdominal wall hernia. No evidence of obstruction. Stomach is within normal limits. Vascular/Lymphatic: No significant vascular findings are present. No enlarged abdominal or pelvic lymph nodes. Reproductive: No acute abnormality. Other: No free intraperitoneal fluid or air. Musculoskeletal: No acute fracture. Review of the MIP images confirms the above findings. IMPRESSION: 1. Likely pneumonia in the right upper lobe superimposed on a background of emphysema and bronchiectasis. Follow-up in 6-8 weeks to resolution is recommended to exclude underlying malignancy. 2. New pulmonary nodules in the left upper lobe measuring up to 6 mm. These are favored infectious or inflammatory however are indeterminate. Fleischner criteria do not apply to patients with history of cancer. Continued attention on follow-up. 3. No acute abnormality in the abdomen or pelvis. 4. Large ventral upper abdominal wall hernia containing a portion of the transverse colon without evidence of obstruction. 5. Nonobstructing left nephrolithiasis. Aortic Atherosclerosis (ICD10-I70.0) and Emphysema (ICD10-J43.9). Electronically Signed   By: Norman Gatlin M.D.   On: 09/05/2023 03:09   CT ABDOMEN PELVIS W CONTRAST Result Date: 09/05/2023 CLINICAL DATA:  Shortness of breath and chest pain. History of colon cancer. Masslike consolidation in the right lung on same-day radiograph EXAM: CT ANGIOGRAPHY CHEST CT ABDOMEN AND PELVIS WITH CONTRAST TECHNIQUE: Multidetector CT imaging of the chest was performed using the standard protocol during bolus administration of intravenous contrast. Multiplanar CT image reconstructions and MIPs were obtained to evaluate the  vascular anatomy. Multidetector CT imaging of the abdomen and pelvis was performed using the standard protocol during bolus administration of intravenous contrast. RADIATION DOSE REDUCTION: This exam was performed according to the departmental dose-optimization program which includes automated exposure control, adjustment of the mA and/or kV according to patient size and/or use of iterative reconstruction technique. CONTRAST:  60mL OMNIPAQUE  IOHEXOL  350 MG/ML SOLN COMPARISON:  Same day radiographs; CT chest 01/12/2021 and CT abdomen pelvis 09/09/2016 FINDINGS: CTA CHEST FINDINGS Cardiovascular: Normal caliber thoracic aorta without dissection. Coronary artery and aortic atherosclerotic calcification. Negative for pulmonary embolism. Mediastinum/Nodes: Trachea and esophagus are unremarkable. Borderline enlarged 1.0 cm subcarinal node on series 4, image 139 similar to 01/12/2021. Lungs/Pleura: Moderate emphysema. Diffuse bronchial wall thickening. Patchy geographic airspace consolidation in the right upper lobe superimposed on a background of emphysema and bronchiectasis. This is favored due to pneumonia. 11 mm right upper lobe nodule on series 4, image 61 is unchanged from 2018 and benign. There are new pulmonary nodules in the left upper lobe. For example 6 mm nodule on 4/58 and 6 mm nodule on 4/78. No pleural effusion or pneumothorax. Musculoskeletal: No acute fracture. Review of the MIP images confirms the above findings. CT ABDOMEN and PELVIS FINDINGS Hepatobiliary: No acute abnormality. Redemonstrated hemangioma in the left hepatic lobe on series 5,  image 11 measuring 2.7 cm. Pancreas: Unremarkable. Spleen: Unremarkable. Adrenals/Urinary Tract: Normal adrenal glands. Nonspecific bilateral perinephric stranding similar to prior. Nonobstructing left nephrolithiasis. No hydronephrosis. Unremarkable bladder. Stomach/Bowel: No bowel obstruction or bowel wall thickening postoperative change of partial colectomy with  ileal colonic anastomosis herniated into the large ventral upper abdominal wall hernia. No evidence of obstruction. Stomach is within normal limits. Vascular/Lymphatic: No significant vascular findings are present. No enlarged abdominal or pelvic lymph nodes. Reproductive: No acute abnormality. Other: No free intraperitoneal fluid or air. Musculoskeletal: No acute fracture. Review of the MIP images confirms the above findings. IMPRESSION: 1. Likely pneumonia in the right upper lobe superimposed on a background of emphysema and bronchiectasis. Follow-up in 6-8 weeks to resolution is recommended to exclude underlying malignancy. 2. New pulmonary nodules in the left upper lobe measuring up to 6 mm. These are favored infectious or inflammatory however are indeterminate. Fleischner criteria do not apply to patients with history of cancer. Continued attention on follow-up. 3. No acute abnormality in the abdomen or pelvis. 4. Large ventral upper abdominal wall hernia containing a portion of the transverse colon without evidence of obstruction. 5. Nonobstructing left nephrolithiasis. Aortic Atherosclerosis (ICD10-I70.0) and Emphysema (ICD10-J43.9). Electronically Signed   By: Norman Gatlin M.D.   On: 09/05/2023 03:09   CT HEAD WO CONTRAST ( ) Result Date: 09/05/2023 CLINICAL DATA:  Headache, increasing frequency or severity EXAM: CT HEAD WITHOUT CONTRAST TECHNIQUE: Contiguous axial images were obtained from the base of the skull through the vertex without intravenous contrast. RADIATION DOSE REDUCTION: This exam was performed according to the departmental dose-optimization program which includes automated exposure control, adjustment of the mA and/or kV according to patient size and/or use of iterative reconstruction technique. COMPARISON:  CT head 01/02/2012 FINDINGS: Brain: No evidence of large-territorial acute infarction. No parenchymal hemorrhage. No mass lesion. No extra-axial collection. No mass effect or  midline shift. No hydrocephalus. Basilar cisterns are patent. Vascular: No hyperdense vessel. Atherosclerotic calcifications are present within the cavernous internal carotid and vertebral arteries. Skull: No acute fracture or focal lesion. Sinuses/Orbits: Paranasal sinuses and mastoid air cells are clear. Bilateral lens replacement. Otherwise the orbits are unremarkable. Other: None. IMPRESSION: No acute intracranial abnormality. Electronically Signed   By: Morgane  Naveau M.D.   On: 09/05/2023 03:01   DG Chest Port 1 View Result Date: 09/05/2023 CLINICAL DATA:  Shortness of breath and chest pain EXAM: PORTABLE CHEST 1 VIEW COMPARISON:  Radiograph and CT 01/12/2021 FINDINGS: Masslike airspace opacity in the right mid to upper lung. Bibasilar atelectasis. No pleural effusion or pneumothorax. Left chest wall Port-A-Cath tip at the venous confluence with the SVC. IMPRESSION: Masslike airspace opacity in the right mid to upper lung. This could be due to pneumonia however malignancy is not excluded. Recommend further evaluation with CT. Electronically Signed   By: Norman Gatlin M.D.   On: 09/05/2023 01:43    Microbiology: Results for orders placed or performed during the hospital encounter of 09/05/23  Resp panel by RT-PCR (RSV, Flu A&B, Covid) Anterior Nasal Swab     Status: None   Collection Time: 09/05/23  1:26 AM   Specimen: Anterior Nasal Swab  Result Value Ref Range Status   SARS Coronavirus 2 by RT PCR NEGATIVE NEGATIVE Final    Comment: (NOTE) SARS-CoV-2 target nucleic acids are NOT DETECTED.  The SARS-CoV-2 RNA is generally detectable in upper respiratory specimens during the acute phase of infection. The lowest concentration of SARS-CoV-2 viral copies this assay can detect is 138 copies/mL.  A negative result does not preclude SARS-Cov-2 infection and should not be used as the sole basis for treatment or other patient management decisions. A negative result may occur with  improper  specimen collection/handling, submission of specimen other than nasopharyngeal swab, presence of viral mutation(s) within the areas targeted by this assay, and inadequate number of viral copies(<138 copies/mL). A negative result must be combined with clinical observations, patient history, and epidemiological information. The expected result is Negative.  Fact Sheet for Patients:  BloggerCourse.com  Fact Sheet for Healthcare Providers:  SeriousBroker.it  This test is no t yet approved or cleared by the [place]  FDA and  has been authorized for detection and/or diagnosis of SARS-CoV-2 by FDA under an Emergency Use Authorization (EUA). This EUA will remain  in effect (meaning this test can be used) for the duration of the COVID-19 declaration under Section 564(b)(1) of the Act, 21 U.S.C.section 360bbb-3(b)(1), unless the authorization is terminated  or revoked sooner.       Influenza A by PCR NEGATIVE NEGATIVE Final   Influenza B by PCR NEGATIVE NEGATIVE Final    Comment: (NOTE) The Xpert Xpress SARS-CoV-2/FLU/RSV plus assay is intended as an aid in the diagnosis of influenza from Nasopharyngeal swab specimens and should not be used as a sole basis for treatment. Nasal washings and aspirates are unacceptable for Xpert Xpress SARS-CoV-2/FLU/RSV testing.  Fact Sheet for Patients: BloggerCourse.com  Fact Sheet for Healthcare Providers: SeriousBroker.it  This test is not yet approved or cleared by the [place]  FDA and has been authorized for detection and/or diagnosis of SARS-CoV-2 by FDA under an Emergency Use Authorization (EUA). This EUA will remain in effect (meaning this test can be used) for the duration of the COVID-19 declaration under Section 564(b)(1) of the Act, 21 U.S.C. section 360bbb-3(b)(1), unless the authorization is terminated or revoked.     Resp  Syncytial Virus by PCR NEGATIVE NEGATIVE Final    Comment: (NOTE) Fact Sheet for Patients: BloggerCourse.com  Fact Sheet for Healthcare Providers: SeriousBroker.it  This test is not yet approved or cleared by the [place]  FDA and has been authorized for detection and/or diagnosis of SARS-CoV-2 by FDA under an Emergency Use Authorization (EUA). This EUA will remain in effect (meaning this test can be used) for the duration of the COVID-19 declaration under Section 564(b)(1) of the Act, 21 U.S.C. section 360bbb-3(b)(1), unless the authorization is terminated or revoked.  Performed at [facility], [address], [place], [place] [address]   Culture, blood (Routine X 2) w Reflex to ID Panel     Status: None (Preliminary result)   Collection Time: 09/05/23  1:34 AM   Specimen: BLOOD  Result Value Ref Range Status   Specimen Description   Final    BLOOD RIGHT ANTECUBITAL Performed at [facility], [address] [place], [place] [address]    Special Requests   Final    BOTTLES DRAWN AEROBIC AND ANAEROBIC Blood Culture adequate volume Performed at [facility], [address], [place], [place] [address]    Culture   Final    NO GROWTH 4 DAYS Performed at [facility], [address] [place], [place] [address]    Report Status PENDING  Incomplete  Culture, blood (Routine X 2) w Reflex to ID Panel     Status: None (Preliminary result)   Collection Time: 09/05/23  1:36 AM   Specimen: BLOOD  Result Value Ref Range Status   Specimen Description  Final    BLOOD LEFT ANTECUBITAL Performed at [facility], [address], [place], [place] [address]    Special Requests   Final    BOTTLES DRAWN AEROBIC AND ANAEROBIC Blood Culture adequate volume Performed at [facility], [address], [place], [place] [address]     Culture   Final    NO GROWTH 4 DAYS Performed at [facility], [address] [place], [place] [address]    Report Status PENDING  Incomplete  MRSA Next Gen by PCR, Nasal     Status: None   Collection Time: 09/08/23  7:37 AM   Specimen: Nasal Mucosa; Nasal Swab  Result Value Ref Range Status   MRSA by PCR Next Gen NOT DETECTED NOT DETECTED Final    Comment: (NOTE) The GeneXpert MRSA Assay (FDA approved for NASAL specimens only), is one component of a comprehensive MRSA colonization surveillance program. It is not intended to diagnose MRSA infection nor to guide or monitor treatment for MRSA infections. Test performance is not FDA approved in patients less than 74 years old. Performed at [facility], [address], [place], [place] [address]   Expectorated Sputum Assessment w Gram Stain, Rflx to Resp Cult     Status: None   Collection Time: 09/09/23 10:47 AM   Specimen: Expectorated Sputum  Result Value Ref Range Status   Specimen Description EXPECTORATED SPUTUM  Final   Special Requests NONE  Final   Sputum evaluation   Final    Sputum specimen not acceptable for testing.  Please recollect.   PATEL, K. Performed at [facility], [address], [place], [place] [address]    Report Status 09/09/2023 FINAL  Final    Labs: CBC: Recent Labs  Lab 09/05/23 0545 09/06/23 0454 09/07/23 0354 09/08/23 0346 09/09/23 0403  WBC 22.6* 22.3* 14.8* 19.1* 28.0*  HGB 17.1* 16.0 16.2 15.0 14.3  HCT 51.8 47.9 48.9 46.7 43.2  MCV 97.6 100.0 99.8 100.0 97.5  PLT 161 173 174 196 259   Basic Metabolic Panel: Recent Labs  Lab 09/05/23 0126 09/05/23 0545 09/06/23 0454 09/09/23 0403  NA 132*  --  135 131*  K 4.0  --  4.8 4.6  CL 89*  --  93* 88*  CO2 25  --  31 33*  GLUCOSE 186*  --  134* 140*  BUN 44*  --  42* 29*  CREATININE 1.75* 1.43* 1.19 1.07  CALCIUM  9.9  --  9.4 8.8*   Liver Function Tests: No results for input(s): AST,  ALT, ALKPHOS, BILITOT, PROT, ALBUMIN in the last 168 hours. CBG: Recent Labs  Lab 09/08/23 1123 09/08/23 1650 09/08/23 2205 09/09/23 0734 09/09/23 1152  GLUCAP 170* 176* 174* 204* 160*    Discharge time spent: greater than 30 minutes.  Signed: Owen DELENA Lore, MD [facility] 09/09/2023

## 2023-09-09 NOTE — Progress Notes (Signed)
 Occupational Therapy Treatment Patient Details Name: [name] MRN: [id number] DOB: 06-11-1955 Today's Date: 09/09/2023   History of present illness Pt is a 68 y/o male admitted 09/05/23 for severe sepsis due to PNA, lactic acidosis, acute hypoxic respiratory failure, AKI and elevated troponin due to demand ischemia. Chest CT showed multiple pulmonary nodules to be followed up outpatient. 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   OT comments  Patient seen for brief OT education session with reinforcement of 5 P's of Energy conservation Handouts, B UE tband therex, pursed lip breathing and rollator safety. Patient awaiting transport for discharge home and will receive HHOT services. Will continue to follow acutely to allow for safe d/c.       If plan is discharge home, recommend the following:  A little help with bathing/dressing/bathroom;Assistance with cooking/housework;A little help with walking and/or transfers   Equipment Recommendations  Other (comment) (rollator)       Precautions / Restrictions Precautions Precautions: Fall;Other (comment) Recall of Precautions/Restrictions: Intact Precaution/Restrictions Comments: monitor O2 (does not wear at baseline). Mon HR Restrictions Weight Bearing Restrictions Per Provider Order: No        ADL either performed or assessed with clinical judgement   ADL Overall ADL's : Needs assistance/impaired                                       General ADL Comments: educated briefly on 5 P's of ECT in prep for home discharge. Awaiting transport to car and OT able to ensure literature review and carryover for home with patient verbalizing + understanding    Extremity/Trunk Assessment Upper Extremity Assessment Upper Extremity Assessment: Overall WFL for tasks assessed   Lower Extremity Assessment Lower Extremity Assessment: Overall WFL for tasks  assessed        Vision   Vision Assessment?: No apparent visual deficits         Communication Communication Communication: No apparent difficulties   Cognition Arousal: Alert Behavior During Therapy: WFL for tasks assessed/performed Cognition: No apparent impairments                               Following commands: Intact        Cueing   Cueing Techniques: Verbal cues  Exercises Exercises: Other exercises (L2 tband for triceps press, Flutter valve q hr 10 reps, pursed lip breathing reviewed on 5 P's Handout)       General Comments 5 P's Handout reinforced and placed with belongings for carryover to home especitally with pacing, use of rollator and pursed lip breathing reinforced    Pertinent Vitals/ Pain       Pain Assessment Pain Assessment: No/denies pain   Frequency  Min 2X/week        Progress Toward Goals  OT Goals(current goals can now be found in the care plan section)  Progress towards OT goals: Progressing toward goals  Acute Rehab OT Goals Patient Stated Goal: to get picked up to go home OT Goal Formulation: With patient Time For Goal Achievement: 09/19/23 Potential to Achieve Goals: Good ADL Goals Pt Will Perform Lower Body Bathing: with modified independence;sit to/from stand;sitting/lateral leans Pt Will Transfer to Toilet: with modified independence;ambulating Pt Will Perform Tub/Shower Transfer: Tub transfer;with set-up;ambulating;shower seat Additional ADL Goal #1: Pt to verbalize at least 3  energy conservation strategies to implement during ADLs, IADLs and mobility at home  Plan         AM-PAC OT 6 Clicks Daily Activity     Outcome Measure   Help from another person eating meals?: None Help from another person taking care of personal grooming?: None Help from another person toileting, which includes using toliet, bedpan, or urinal?: None Help from another person bathing (including washing, rinsing, drying)?: A  Little Help from another person to put on and taking off regular upper body clothing?: None Help from another person to put on and taking off regular lower body clothing?: A Little 6 Click Score: 22    End of Session Equipment Utilized During Treatment: Gait belt;Oxygen;Rolling walker (2 wheels)  OT Visit Diagnosis: Other abnormalities of gait and mobility (R26.89);Unsteadiness on feet (R26.81);Muscle weakness (generalized) (M62.81)   Activity Tolerance Patient tolerated treatment well   Patient Left in chair;with call bell/phone within reach;with chair alarm set   Nurse Communication Other (comment) (patient awaiting transport for d/c)        Time: 8355-8345 OT Time Calculation (min): 10 min  Charges: OT General Charges $OT Visit: 1 Visit OT Treatments $Therapeutic Activity: 8-22 mins  Nastassia Bazaldua OT/L Acute Rehabilitation Department  [phone]  09/09/2023, 5:46 PM

## 2023-09-09 NOTE — TOC Transition Note (Signed)
 Transition of Care [facility]) - Discharge Note   Patient Details  Name: [name] MRN: [id number] Date of Birth: 04-28-1955  [facility] CM/SW Contact:  Tawni CHRISTELLA Eva, LCSW Phone Number: 09/09/2023, 1:12 PM   Clinical Narrative:     CSW met with the patient to discuss recommendations. The patient is recommended for home health services, home oxygen, and a rolling walker. The patient reports he would prefer a rollator instead of a rolling walker. He is agreeable to home health services and has no preference for a home health agency or DME provider.  CSW sent a referral to [employer] for the rollator and home oxygen to be delivered to the patient's room prior to discharge.  [facility] has accepted the patient for home health  HHPT/OT. Care management sign off.   Final next level of care: Home w Home Health Services Barriers to Discharge: Barriers Resolved   Patient Goals and CMS Choice Patient states their goals for this hospitalization and ongoing recovery are:: return home with home health CMS Medicare.gov Compare Post Acute Care list provided to:: Patient Choice offered to / list presented to : Patient      Discharge Placement                    Patient and family notified of of transfer: 09/09/23  Discharge Plan and Services Additional resources added to the After Visit Summary for                  DME Arranged: Walker rolling with seat, Oxygen DME Agency: [employer] Date DME Agency Contacted: 09/09/23 Time DME Agency Contacted: 1312   HH Arranged: PT, OT HH Agency: [facility] Date HH Agency Contacted: 09/09/23 Time HH Agency Contacted: 1312    Social Drivers of Health (SDOH) Interventions SDOH Screenings   Food Insecurity: No Food Insecurity (09/05/2023)  Housing: Low Risk  (09/05/2023)  Transportation Needs: No Transportation Needs (09/05/2023)  Utilities: Not At Risk (09/05/2023)  Social Connections: Socially  Isolated (09/05/2023)  Tobacco Use: High Risk (09/05/2023)     Readmission Risk Interventions    01/15/2021    2:43 PM  Readmission Risk Prevention Plan  Medication Screening Complete  Transportation Screening Complete

## 2023-09-10 LAB — CULTURE, BLOOD (ROUTINE X 2)
Culture: NO GROWTH
Culture: NO GROWTH
Special Requests: ADEQUATE
Special Requests: ADEQUATE

## 2023-09-15 ENCOUNTER — Ambulatory Visit (INDEPENDENT_AMBULATORY_CARE_PROVIDER_SITE_OTHER): Admitting: Otolaryngology

## 2023-10-09 ENCOUNTER — Emergency Department (HOSPITAL_BASED_OUTPATIENT_CLINIC_OR_DEPARTMENT_OTHER)

## 2023-10-09 ENCOUNTER — Other Ambulatory Visit: Payer: Self-pay

## 2023-10-09 ENCOUNTER — Encounter (HOSPITAL_BASED_OUTPATIENT_CLINIC_OR_DEPARTMENT_OTHER): Payer: Self-pay | Admitting: Internal Medicine

## 2023-10-09 ENCOUNTER — Inpatient Hospital Stay (HOSPITAL_BASED_OUTPATIENT_CLINIC_OR_DEPARTMENT_OTHER)
Admission: EM | Admit: 2023-10-09 | Discharge: 2023-10-15 | DRG: 871 | Disposition: A | Discharge status: Home Health | Source: Ambulatory Visit | Attending: Internal Medicine | Admitting: Internal Medicine

## 2023-10-09 ENCOUNTER — Emergency Department (HOSPITAL_BASED_OUTPATIENT_CLINIC_OR_DEPARTMENT_OTHER): Admitting: Radiology

## 2023-10-09 DIAGNOSIS — Z716 Tobacco abuse counseling: Secondary | ICD-10-CM

## 2023-10-09 DIAGNOSIS — E66812 Obesity, class 2: Secondary | ICD-10-CM | POA: Diagnosis present

## 2023-10-09 DIAGNOSIS — E876 Hypokalemia: Secondary | ICD-10-CM | POA: Diagnosis present

## 2023-10-09 DIAGNOSIS — Z9221 Personal history of antineoplastic chemotherapy: Secondary | ICD-10-CM

## 2023-10-09 DIAGNOSIS — Z9049 Acquired absence of other specified parts of digestive tract: Secondary | ICD-10-CM

## 2023-10-09 DIAGNOSIS — R6520 Severe sepsis without septic shock: Secondary | ICD-10-CM | POA: Diagnosis present

## 2023-10-09 DIAGNOSIS — A419 Sepsis, unspecified organism: Principal | ICD-10-CM | POA: Diagnosis present

## 2023-10-09 DIAGNOSIS — Z7951 Long term (current) use of inhaled steroids: Secondary | ICD-10-CM

## 2023-10-09 DIAGNOSIS — Z85038 Personal history of other malignant neoplasm of large intestine: Secondary | ICD-10-CM

## 2023-10-09 DIAGNOSIS — J159 Unspecified bacterial pneumonia: Secondary | ICD-10-CM | POA: Diagnosis present

## 2023-10-09 DIAGNOSIS — G545 Neuralgic amyotrophy: Secondary | ICD-10-CM | POA: Diagnosis present

## 2023-10-09 DIAGNOSIS — F17210 Nicotine dependence, cigarettes, uncomplicated: Secondary | ICD-10-CM | POA: Diagnosis present

## 2023-10-09 DIAGNOSIS — Z8701 Personal history of pneumonia (recurrent): Secondary | ICD-10-CM

## 2023-10-09 DIAGNOSIS — E872 Acidosis, unspecified: Secondary | ICD-10-CM | POA: Diagnosis present

## 2023-10-09 DIAGNOSIS — J440 Chronic obstructive pulmonary disease with acute lower respiratory infection: Secondary | ICD-10-CM | POA: Diagnosis present

## 2023-10-09 DIAGNOSIS — J441 Chronic obstructive pulmonary disease with (acute) exacerbation: Secondary | ICD-10-CM

## 2023-10-09 DIAGNOSIS — I5033 Acute on chronic diastolic (congestive) heart failure: Secondary | ICD-10-CM | POA: Diagnosis present

## 2023-10-09 DIAGNOSIS — R7303 Prediabetes: Secondary | ICD-10-CM | POA: Diagnosis present

## 2023-10-09 DIAGNOSIS — Z6834 Body mass index (BMI) 34.0-34.9, adult: Secondary | ICD-10-CM

## 2023-10-09 DIAGNOSIS — J189 Pneumonia, unspecified organism: Principal | ICD-10-CM | POA: Diagnosis present

## 2023-10-09 DIAGNOSIS — I509 Heart failure, unspecified: Secondary | ICD-10-CM

## 2023-10-09 DIAGNOSIS — N1832 Chronic kidney disease, stage 3b: Secondary | ICD-10-CM | POA: Diagnosis present

## 2023-10-09 DIAGNOSIS — Z79899 Other long term (current) drug therapy: Secondary | ICD-10-CM

## 2023-10-09 LAB — BASIC METABOLIC PANEL WITH GFR
Anion gap: 12 (ref 5–15)
BUN: 11 mg/dL (ref 8–23)
CO2: 27 mmol/L (ref 22–32)
Calcium: 10.1 mg/dL (ref 8.9–10.3)
Chloride: 94 mmol/L — ABNORMAL LOW (ref 98–111)
Creatinine, Ser: 1.02 mg/dL (ref 0.61–1.24)
GFR, Estimated: >60 mL/min (ref >60)
Glucose, Bld: 116 mg/dL — ABNORMAL HIGH (ref 70–99)
Potassium: 3.8 mmol/L (ref 3.5–5.1)
Sodium: 134 mmol/L — ABNORMAL LOW (ref 135–145)

## 2023-10-09 LAB — CBC
HCT: 45.2 % (ref 39.0–52.0)
Hemoglobin: 15.1 g/dL (ref 13.0–17.0)
MCH: 32.8 pg (ref 26.0–34.0)
MCHC: 33.4 g/dL (ref 30.0–36.0)
MCV: 98 fL (ref 80.0–100.0)
Platelets: 368 K/uL (ref 150–400)
RBC: 4.61 MIL/uL (ref 4.22–5.81)
RDW: 13.7 % (ref 11.5–15.5)
WBC: 35.8 K/uL — ABNORMAL HIGH (ref 4.0–10.5)
nRBC: 0 % (ref 0.0–0.2)

## 2023-10-09 LAB — URINALYSIS, W/ REFLEX TO CULTURE (INFECTION SUSPECTED)
Bacteria, UA: NONE SEEN
Bilirubin Urine: NEGATIVE
Glucose, UA: NEGATIVE mg/dL
Hgb urine dipstick: NEGATIVE
Ketones, ur: NEGATIVE mg/dL
Leukocytes,Ua: NEGATIVE
Nitrite: NEGATIVE
Specific Gravity, Urine: >1.046 — ABNORMAL HIGH (ref 1.005–1.030)
pH: 7.5 (ref 5.0–8.0)

## 2023-10-09 LAB — LACTIC ACID, PLASMA
Lactic Acid, Venous: 2.5 mmol/L (ref 0.5–1.9)
Lactic Acid, Venous: 2.8 mmol/L (ref 0.5–1.9)

## 2023-10-09 LAB — TROPONIN T, HIGH SENSITIVITY
Troponin T High Sensitivity: 24 ng/L — ABNORMAL HIGH (ref 0–19)
Troponin T High Sensitivity: 20 ng/L — ABNORMAL HIGH (ref 0–19)

## 2023-10-09 LAB — PRO BRAIN NATRIURETIC PEPTIDE: Pro Brain Natriuretic Peptide: 650 pg/mL — ABNORMAL HIGH (ref <300.0)

## 2023-10-09 LAB — PROTIME-INR
INR: 1.1 (ref 0.8–1.2)
Prothrombin Time: 14.8 s (ref 11.4–15.2)

## 2023-10-09 MED ORDER — NICOTINE 21 MG/24HR TD PT24
21.0000 mg | MEDICATED_PATCH | Freq: Once | TRANSDERMAL | Status: DC
  Administered 2023-10-09: 21 mg via TRANSDERMAL
  Filled 2023-10-09: qty 1

## 2023-10-09 MED ORDER — FUROSEMIDE 10 MG/ML IJ SOLN
40.0000 mg | Freq: Once | INTRAMUSCULAR | Status: AC
  Administered 2023-10-09: 40 mg via INTRAVENOUS
  Filled 2023-10-09: qty 4

## 2023-10-09 MED ORDER — LACTATED RINGERS IV BOLUS (SEPSIS)
500.0000 mL | Freq: Once | INTRAVENOUS | Status: AC
  Administered 2023-10-09: 500 mL via INTRAVENOUS

## 2023-10-09 MED ORDER — LACTATED RINGERS IV SOLN: INTRAVENOUS | Status: AC

## 2023-10-09 MED ORDER — VANCOMYCIN HCL 750 MG IV SOLR
750.0000 mg | Freq: Once | INTRAVENOUS | Status: AC
  Administered 2023-10-09: 750 mg via INTRAVENOUS
  Filled 2023-10-09: qty 15

## 2023-10-09 MED ORDER — IPRATROPIUM-ALBUTEROL 0.5-2.5 (3) MG/3ML IN SOLN
3.0000 mL | Freq: Four times a day (QID) | RESPIRATORY_TRACT | Status: DC | PRN
  Administered 2023-10-09 – 2023-10-10 (×2): 3 mL via RESPIRATORY_TRACT
  Filled 2023-10-09 (×2): qty 3

## 2023-10-09 MED ORDER — LACTATED RINGERS IV BOLUS
1000.0000 mL | Freq: Once | INTRAVENOUS | Status: AC
  Administered 2023-10-09: 1000 mL via INTRAVENOUS

## 2023-10-09 MED ORDER — OXYCODONE-ACETAMINOPHEN 5-325 MG PO TABS
2.0000 | ORAL_TABLET | Freq: Once | ORAL | Status: AC
  Administered 2023-10-09: 2 via ORAL
  Filled 2023-10-09: qty 2

## 2023-10-09 MED ORDER — IOHEXOL 350 MG/ML SOLN
75.0000 mL | Freq: Once | INTRAVENOUS | Status: AC | PRN
  Administered 2023-10-09: 75 mL via INTRAVENOUS

## 2023-10-09 MED ORDER — VANCOMYCIN HCL IN DEXTROSE 1-5 GM/200ML-% IV SOLN
1000.0000 mg | Freq: Once | INTRAVENOUS | Status: AC
  Administered 2023-10-09: 1000 mg via INTRAVENOUS
  Filled 2023-10-09: qty 200

## 2023-10-09 MED ORDER — GUAIFENESIN ER 600 MG PO TB12
1200.0000 mg | ORAL_TABLET | Freq: Two times a day (BID) | ORAL | Status: DC
  Administered 2023-10-10 – 2023-10-15 (×10): 1200 mg via ORAL
  Filled 2023-10-09 (×11): qty 2

## 2023-10-09 MED ORDER — SODIUM CHLORIDE 0.9 % IV SOLN
2.0000 g | Freq: Once | INTRAVENOUS | Status: AC
  Administered 2023-10-09: 2 g via INTRAVENOUS
  Filled 2023-10-09: qty 12.5

## 2023-10-09 MED ORDER — VANCOMYCIN HCL IN DEXTROSE 1-5 GM/200ML-% IV SOLN: 1000.0000 mg | Freq: Once | INTRAVENOUS | Status: DC

## 2023-10-09 MED ORDER — BUDESON-GLYCOPYRROL-FORMOTEROL 160-9-4.8 MCG/ACT IN AERO
2.0000 | INHALATION_SPRAY | Freq: Two times a day (BID) | RESPIRATORY_TRACT | Status: DC
Start: 2023-10-09 — End: 2023-10-09

## 2023-10-09 NOTE — Plan of Care (Signed)
  Lactic acid trending up 2.5 to 2.8.  Giving 1 L of LR bolus afterward continue maintenance fluid LR 150 cc/h. Also has to be careful and monitor respiratory status given patient has underlying CHF exacerbation as well with elevated BNP and bilateral lower extremity swelling.

## 2023-10-09 NOTE — ED Provider Notes (Signed)
 [place] [facility]MENT AT [facility] Provider Note   CSN: [id number] Arrival date & time: 10/09/23  1713     Patient presents with: Chest Pain   [name] is a 68 y.o. male.   Patient is a 68 year old male who presents with chest pain and shortness of breath.  He has a history of COPD, tobacco use, prior colon cancer, chronic kidney disease.  He was recently admitted for pneumonia from August 8 to August 12.  He was discharged on prednisone  and a 7-day course of Levaquin .  He states he does not feel like he ever got really any better since then.  He has had some progressive shortness of breath.  He has to sleep sitting up.  He started having some pains in his chest, mostly across the left side.  He says it is worse when he moves around.  He has had progressive swelling of his lower extremities bilaterally.  He said it started when he was in the hospital but has gotten worse at home.  He has had some subjective fevers.  No vomiting or diarrhea.       Prior to Admission medications   Medication Sig Start Date End Date Taking? Authorizing Provider  albuterol  (PROVENTIL ) (2.5 MG/3ML) 0.083% nebulizer solution Take 3 mLs (2.5 mg total) by nebulization every 4 (four) hours as needed for wheezing or shortness of breath. 01/15/21   Pearlean Manus, MD  albuterol  (VENTOLIN  HFA) 108 (90 Base) MCG/ACT inhaler Inhale 2 puffs into the lungs every 4 (four) hours as needed. 05/13/23   Renato Dorothey HERO, NP  b complex vitamins capsule Take 1 capsule by mouth daily.    [provider]  calcium  carbonate (TUMS - DOSED IN MG ELEMENTAL CALCIUM ) 500 MG chewable tablet Chew 500-1,000 mg by mouth as needed for indigestion or heartburn.    [provider]  chlorthalidone  (HYGROTON ) 25 MG tablet Take 1 tablet (25 mg total) by mouth every morning with food. 02/24/23   Renato Dorothey HERO, NP  Cholecalciferol (VITAMIN D3) 125 MCG (5000 UT) TABS Take 10,000 Units by mouth  daily.    [provider]  guaiFENesin  (MUCINEX ) 600 MG 12 hr tablet Take 2 tablets (1,200 mg total) by mouth 2 (two) times daily. 09/09/23 10/09/23  Regalado, Belkys A, MD  ipratropium-albuterol  (DUONEB) 0.5-2.5 (3) MG/3ML SOLN Inhale 3 mLs into the lungs every 6 (six) hours as needed. 09/09/23 10/09/23  Regalado, Belkys A, MD  linaclotide (LINZESS) 145 MCG CAPS capsule Take 145 mcg by mouth every other day.    [provider]  Magnesium 300 MG TABS Take 600 mg by mouth daily.    [provider]  oxyCODONE -acetaminophen  (PERCOCET/ROXICET) 5-325 MG tablet Take 1 tablet by mouth every 6 (six) hours as needed for severe pain or moderate pain. Patient taking differently: Take 1 tablet by mouth 4 (four) times daily as needed for severe pain (pain score 7-10) or moderate pain (pain score 4-6). 12/29/20   Lorriane Holmes, MD  tamsulosin  (FLOMAX ) 0.4 MG CAPS capsule Take 1 capsule (0.4 mg total) by mouth 2 (two) times daily. 07/03/23   Carolee Sherwood JONETTA DOUGLAS, MD  TRELEGY ELLIPTA 100-62.5-25 MCG/ACT AEPB Inhale 1 puff into the lungs daily. 08/15/21   [provider]    Allergies: Patient has no known allergies.    Review of Systems  Constitutional:  Positive for fatigue and fever. Negative for chills and diaphoresis.  HENT:  Negative for congestion, rhinorrhea and sneezing.  Eyes: Negative.   Respiratory:  Positive for cough and shortness of breath.   Cardiovascular:  Positive for chest pain and leg swelling.  Gastrointestinal:  Negative for abdominal pain, diarrhea, nausea and vomiting.  Genitourinary:  Negative for difficulty urinating, flank pain, frequency and hematuria.  Musculoskeletal:  Negative for arthralgias and back pain.  Skin:  Negative for rash.  Neurological:  Negative for dizziness, speech difficulty, weakness, numbness and headaches.    Updated Vital Signs BP 124/66   Pulse (!) 106   Temp 98.7 F (37.1 C) (Oral)   Resp (!) 24   SpO2 96%   Physical  Exam Constitutional:      Appearance: He is well-developed.  HENT:     Head: Normocephalic and atraumatic.  Eyes:     Pupils: Pupils are equal, round, and reactive to light.  Cardiovascular:     Rate and Rhythm: Regular rhythm. Tachycardia present.     Heart sounds: Normal heart sounds.  Pulmonary:     Effort: Pulmonary effort is normal. No respiratory distress.     Breath sounds: Wheezing and rales present.     Comments: Mild tachypnea few crackles in the lungs, more on the right side.  Few expiratory wheezes Chest:     Chest wall: No tenderness.  Abdominal:     General: Bowel sounds are normal.     Palpations: Abdomen is soft.     Tenderness: There is no abdominal tenderness. There is no guarding or rebound.     Comments: Large soft nontender ventral hernia  Musculoskeletal:        General: Normal range of motion.     Cervical back: Normal range of motion and neck supple.     Comments: 4+ pitting edema to lower extremities bilaterally, no warmth or erythema  Lymphadenopathy:     Cervical: No cervical adenopathy.  Skin:    General: Skin is warm and dry.     Findings: No rash.  Neurological:     Mental Status: He is alert and oriented to person, place, and time.     (all labs ordered are listed, but only abnormal results are displayed) Labs Reviewed  BASIC METABOLIC PANEL WITH GFR - Abnormal; Notable for the following components:      Result Value   Sodium 134 (*)    Chloride 94 (*)    Glucose, Bld 116 (*)    All other components within normal limits  CBC - Abnormal; Notable for the following components:   WBC 35.8 (*)    All other components within normal limits  LACTIC ACID, PLASMA - Abnormal; Notable for the following components:   Lactic Acid, Venous 2.5 (*)    All other components within normal limits  LACTIC ACID, PLASMA - Abnormal; Notable for the following components:   Lactic Acid, Venous 2.8 (*)    All other components within normal limits  URINALYSIS, W/  REFLEX TO CULTURE (INFECTION SUSPECTED) - Abnormal; Notable for the following components:   Specific Gravity, Urine >1.046 (*)    Protein, ur TRACE (*)    All other components within normal limits  PRO BRAIN NATRIURETIC PEPTIDE - Abnormal; Notable for the following components:   Pro Brain Natriuretic Peptide 650.0 (*)    All other components within normal limits  TROPONIN T, HIGH SENSITIVITY - Abnormal; Notable for the following components:   Troponin T High Sensitivity 24 (*)    All other components within normal limits  TROPONIN T, HIGH SENSITIVITY - Abnormal; Notable  for the following components:   Troponin T High Sensitivity 20 (*)    All other components within normal limits  CULTURE, BLOOD (ROUTINE X 2)  CULTURE, BLOOD (ROUTINE X 2)  RESPIRATORY PANEL BY PCR  EXPECTORATED SPUTUM ASSESSMENT W GRAM STAIN, RFLX TO RESP C  PROTIME-INR  LEGIONELLA PNEUMOPHILA SEROGP 1 UR AG  STREP PNEUMONIAE URINARY ANTIGEN  PROCALCITONIN  CBC  COMPREHENSIVE METABOLIC PANEL WITH GFR    EKG: EKG Interpretation Date/Time:  Thursday October 09 2023 17:21:44 EDT Ventricular Rate:  105 PR Interval:  160 QRS Duration:  86 QT Interval:  318 QTC Calculation: 420 R Axis:   58  Text Interpretation: Sinus tachycardia Nonspecific T wave abnormality Abnormal ECG When compared with ECG of 07-Sep-2023 19:01, Non-specific change in ST segment in Anterior leads Nonspecific T wave abnormality now evident in Anterolateral leads since last tracing no significant change Confirmed by Lenor Hollering [phone]) on 10/09/2023 6:09:16 PM  Radiology: CT Angio Chest PE W and/or Wo Contrast Result Date: 10/09/2023 CLINICAL DATA:  Concern for pulmonary embolism. Shortness of breath. EXAM: CT ANGIOGRAPHY CHEST WITH CONTRAST TECHNIQUE: Multidetector CT imaging of the chest was performed using the standard protocol during bolus administration of intravenous contrast. Multiplanar CT image reconstructions and MIPs were obtained  to evaluate the vascular anatomy. RADIATION DOSE REDUCTION: This exam was performed according to the departmental dose-optimization program which includes automated exposure control, adjustment of the mA and/or kV according to patient size and/or use of iterative reconstruction technique. CONTRAST:  75mL OMNIPAQUE  IOHEXOL  350 MG/ML SOLN COMPARISON:  Chest CT dated 09/05/2023. FINDINGS: Cardiovascular: There is no cardiomegaly or pericardial effusion. There is coronary vascular calcification of the LAD. Mild atherosclerotic calcification of the thoracic aorta. No aneurysmal dilatation. No pulmonary artery embolus identified. Mediastinum/Nodes: No hilar or mediastinal adenopathy. The esophagus is grossly unremarkable no mediastinal fluid collection. Lungs/Pleura: Background of emphysema. New area of consolidation in the superior segment of the left lower lobe consistent with pneumonia. Large area of consolidation in the right upper lobe the has slightly contracted since the prior CT. Several small cystic spaces within the consolidation measuring up to 2.7 x 1.6 cm may be related to background of emphysema or represent cavitary changes. Underlying mass is not excluded. Continued follow-up to resolution recommended. There is no pleural effusion or pneumothorax. The central airways are patent. Upper Abdomen: Partially visualized midline ventral hernia containing a segment of the transverse colon. Small bilateral renal nonobstructing calculi. Musculoskeletal: Degenerative changes of the spine. No acute osseous pathology. Review of the MIP images confirms the above findings. IMPRESSION: 1. No CT evidence of pulmonary artery embolus. 2. New area of consolidation in the superior segment of the left lower lobe consistent with pneumonia. Large area of consolidation in the right upper lobe. Follow-up to resolution recommended. 3. Partially visualized midline ventral hernia containing a segment of the transverse colon. 4. Small  bilateral renal nonobstructing calculi. 5. Aortic Atherosclerosis (ICD10-I70.0) and Emphysema (ICD10-J43.9). Electronically Signed   By: Vanetta Chou M.D.   On: 10/09/2023 18:55   DG Chest 2 View Result Date: 10/09/2023 CLINICAL DATA:  Chest pain. EXAM: CHEST - 2 VIEW COMPARISON:  September 07, 2023. FINDINGS: The heart size and mediastinal contours are within normal limits. Left subclavian Port-A-Cath is unchanged. Cavitary lesion is now noted in right upper lobe concerning for cavitary pneumonia or possibly malignancy. Increased left upper lobe opacity is noted concerning for pneumonia. Minimal left basilar subsegmental atelectasis or scarring is noted. The visualized skeletal structures are  unremarkable. IMPRESSION: Cavitary lesion is now noted in right upper lobe concerning for cavitary pneumonia or possibly malignancy. Increased left upper lobe opacity is noted concerning for pneumonia. CT scan of the chest is recommended for further evaluation. Electronically Signed   By: Lynwood Landy Raddle M.D.   On: 10/09/2023 17:45     Procedures   Medications Ordered in the ED  lactated ringers  infusion ( Intravenous New Bag/Given 10/09/23 1920)  ipratropium-albuterol  (DUONEB) 0.5-2.5 (3) MG/3ML nebulizer solution 3 mL (3 mLs Nebulization Given 10/09/23 2157)  guaiFENesin  (MUCINEX ) 12 hr tablet 1,200 mg (has no administration in time range)  nicotine  (NICODERM CQ  - dosed in mg/24 hours) patch 21 mg (21 mg Transdermal Patch Applied 10/09/23 2204)  furosemide  (LASIX ) injection 40 mg (has no administration in time range)  lactated ringers  bolus 500 mL (0 mLs Intravenous Stopped 10/09/23 1921)  ceFEPIme  (MAXIPIME ) 2 g in sodium chloride  0.9 % 100 mL IVPB (0 g Intravenous Stopped 10/09/23 1921)  vancomycin  (VANCOCIN ) IVPB 1000 mg/200 mL premix (0 mg Intravenous Stopped 10/09/23 2040)    Followed by  vancomycin  (VANCOCIN ) 750 mg in sodium chloride  0.9 % 250 mL IVPB (0 mg Intravenous Stopped 10/09/23 2142)  iohexol   (OMNIPAQUE ) 350 MG/ML injection 75 mL (75 mLs Intravenous Contrast Given 10/09/23 1837)  oxyCODONE -acetaminophen  (PERCOCET/ROXICET) 5-325 MG per tablet 2 tablet (2 tablets Oral Given 10/09/23 2206)                                    Medical Decision Making Amount and/or Complexity of Data Reviewed Labs: ordered. Radiology: ordered.  Risk OTC drugs. Prescription drug management. Decision regarding hospitalization.   This patient presents to the ED for concern of chest pain, shortness of breath, this involves an extensive number of treatment options, and is a complaint that carries with it a high risk of complications and morbidity.  I considered the following differential and admission for this acute, potentially life threatening condition.  The differential diagnosis includes pneumonia, PE, lung mass, ACS, CHF, pneumothorax  MDM:    Patient is a 68 year old who presents with worsening shortness of breath associated with some now chest pain and subjective fevers after recently being admitted for pneumonia.  He is mildly tachycardic and mildly tachypneic on evaluation.  His oxygen saturations are in the mid 90s on room air.  He is afebrile.  He has some mild expiratory wheezing but no significant wheezes on exam.  He has marked lower extremity pitting edema.  Chest x-ray shows cavitary appearing lesion in the right upper chest.  His white count is markedly elevated.  Was treated for possible sepsis with IV fluids and IV antibiotics.  Will check a chest CT. CT scan shows enlarging and is consistent with pneumonia and new infection in his left lung.  Labs show markedly elevated WBC count.  His blood pressure is stable.  He is afebrile here.  However he did meet sepsis criteria and was started on IV fluids and IV antibiotics.  He was given cefepime  and vancomycin .  Discussed with Dr. Sundil who will admit the patient for further treatment.  CRITICAL CARE Performed by: Andrea Ness Total critical  care time: 80 minutes Critical care time was exclusive of separately billable procedures and treating other patients. Critical care was necessary to treat or prevent imminent or life-threatening deterioration. Critical care was time spent personally by me on the following activities: development of treatment plan  with patient and/or surrogate as well as nursing, discussions with consultants, evaluation of patient's response to treatment, examination of patient, obtaining history from patient or surrogate, ordering and performing treatments and interventions, ordering and review of laboratory studies, ordering and review of radiographic studies, pulse oximetry and re-evaluation of patient's condition.   (Labs, imaging, consults)  Labs: I Ordered, and personally interpreted labs.  The pertinent results include: Elevated WBC count, elevated lactate  Imaging Studies ordered: I ordered imaging studies including chest x-ray, CT chest I independently visualized and interpreted imaging. I agree with the radiologist interpretation  Additional history obtained from ex-wife.  External records from outside source obtained and reviewed including history  Cardiac Monitoring: The patient was maintained on a cardiac monitor.  If on the cardiac monitor, I personally viewed and interpreted the cardiac monitored which showed an underlying rhythm of: Sinus tachycardia  Reevaluation: After the interventions noted above, I reevaluated the patient and found that they have :improved  Social Determinants of Health:  smoker  Disposition: Admit to hospital  Co morbidities that complicate the patient evaluation  Past Medical History:  Diagnosis Date   Anemia    IRON 2005   Anxiety    Arthritis    LOWER LUMBAR STENOSIS , ARTHRITIS HANDS AND SHOULDERS   Asthma    Cancer (HCC) 2005   COLON SURGERY AND CHEMO   Complication of anesthesia    WOKE UP DURING PORT PLACEMENT AND ENDOSCOPY, SLOW TO AWAKEN FROM  COLON RESECTION 2010   COPD (chronic obstructive pulmonary disease) (HCC)    Dyspnea    WITH EXERTION   GERD (gastroesophageal reflux disease)    Hydrocele, right    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    Pneumonia 2016 LAST    X 3 EPISODES IN PAST   Vitamin D deficiency      Medicines Meds ordered this encounter  Medications   lactated ringers  infusion   lactated ringers  bolus 500 mL    Reason 30 mL/kg dose is not being ordered:   First Lactic Acid Pending   DISCONTD: vancomycin  (VANCOCIN ) IVPB 1000 mg/200 mL premix    Indication::   Other Indication (list below)    Other Indication::   HCAP   ceFEPIme  (MAXIPIME ) 2 g in sodium chloride  0.9 % 100 mL IVPB    Antibiotic Indication::   HCAP   FOLLOWED BY Linked Order Group    vancomycin  (VANCOCIN ) IVPB 1000 mg/200 mL premix     Indication::   Other Indication (list below)     Other Indication::   HCAP    vancomycin  (VANCOCIN ) 750 mg in sodium chloride  0.9 % 250 mL IVPB     Indication::   Other Indication (list below)     Other Indication::   HCAP   iohexol  (OMNIPAQUE ) 350 MG/ML injection 75 mL   ipratropium-albuterol  (DUONEB) 0.5-2.5 (3) MG/3ML nebulizer solution 3 mL   DISCONTD: budesonide -glycopyrrolate -formoterol  (BREZTRI ) 160-9-4.8 MCG/ACT inhaler 2 puff   guaiFENesin  (MUCINEX ) 12 hr tablet 1,200 mg   nicotine  (NICODERM CQ  - dosed in mg/24 hours) patch 21 mg   furosemide  (LASIX ) injection 40 mg   oxyCODONE -acetaminophen  (PERCOCET/ROXICET) 5-325 MG per tablet 2 tablet    Refill:  0    I have reviewed the patients home medicines and have made adjustments as needed  Problem List / ED Course: Problem List Items Addressed This Visit       Respiratory   Multifocal pneumonia - Primary   Relevant Medications  ipratropium-albuterol  (DUONEB) 0.5-2.5 (3) MG/3ML nebulizer solution 3 mL   guaiFENesin  (MUCINEX ) 12 hr tablet 1,200 mg             Final diagnoses:  Multifocal pneumonia    ED Discharge  Orders     None          Lenor Hollering, MD 10/09/23 2210

## 2023-10-09 NOTE — Progress Notes (Signed)
 Elink following for sepsis protocol.

## 2023-10-09 NOTE — Plan of Care (Signed)
 RN reported that patient does not use Trelegy anymore and he is asking to take it off. Patient has history of COPD.  Continue DuoNeb as needed.

## 2023-10-09 NOTE — Plan of Care (Addendum)
 [facility] emergency department to [facility] cardiac telemetry unit tranfer:   68 year old man past medical history of COPD, colon cancer status post surgery and chemotherapy, CKD 3A, active smoking cigarette, prediabetic and morbid obesity present emergency department complaining of chest pain and shortness of breath for 2 days.He was recently admitted for pneumonia from August 8 to August 12. He was discharged on prednisone  and a 7-day course of Levaquin . He states he does not feel like he ever got really any better since then. He has had some progressive shortness of breath. He has to sleep sitting up. He started having some pains in his chest, mostly across the left side. He says it is worse when he moves around. He has had progressive swelling of his lower extremities bilaterally. He said it started when he was in the hospital but has gotten worse at home. He has had some subjective fevers. No vomiting or diarrhea.   At presentation to ED patient found tachycardic and tachypneic.  Afebrile blood pressure borderline soft.  O2 sat 96% room air. CBC showing leukocytosis 35.8.   BMP unremarkable except low sodium 134 at baseline. Flat troponin 2024 at baseline.  Previous troponin was 4351 1 month ago when admitted. Blood cultures are in process.  Elevated lactic acid level 2.5.  Elevated proBNP 650.  CTA chest no evidence of PE.  It shows new areas of consolidation.  New area of consolidation in the superior segment of the left lower lobe consistent with pneumonia. Large area of consolidation in the right upper lobe. Follow-up to resolution recommended. 3. Partially visualized midline ventral hernia containing a segment of the transverse colon. 4. Small bilateral renal nonobstructing calculi. 5. Aortic Atherosclerosis (ICD10-I70.0) and Emphysema (ICD10-J43.9).  Chest x-ray IMPRESSION: Cavitary lesion is now noted in right upper lobe concerning for cavitary pneumonia or possibly malignancy.  Increased left upper lobe opacity is noted concerning for pneumonia. CT scan of the chest is recommended for further evaluation.   In the ED code sepsis has been activated.  Patient received 500 mL of LR bolus and currently on maintenance fluid LR, cefepime  and vancomycin .  In the setting of elevated BNP concern of CHF exacerbation limiting IV fluid. Dr. Lenor reported that patient has bilateral lower extremity significant swelling however there is no evidence for cellulitis.  Hospitalist has been consulted for further management evaluation of sepsis in the setting of multifocal pneumonia, elevated troponin-secondary to demand ischemia and new onset of CHF with exacerbation.    Alizia Greif, MD [facility] 10/09/2023, 9:19 PM

## 2023-10-09 NOTE — ED Triage Notes (Signed)
 C/o left sided  CP x 2 days. Denies SHOB.Recent hospitalization for PNA.

## 2023-10-10 DIAGNOSIS — E876 Hypokalemia: Secondary | ICD-10-CM | POA: Diagnosis present

## 2023-10-10 DIAGNOSIS — A419 Sepsis, unspecified organism: Secondary | ICD-10-CM

## 2023-10-10 DIAGNOSIS — Z9049 Acquired absence of other specified parts of digestive tract: Secondary | ICD-10-CM | POA: Diagnosis not present

## 2023-10-10 DIAGNOSIS — R7303 Prediabetes: Secondary | ICD-10-CM | POA: Diagnosis present

## 2023-10-10 DIAGNOSIS — J9601 Acute respiratory failure with hypoxia: Secondary | ICD-10-CM | POA: Diagnosis not present

## 2023-10-10 DIAGNOSIS — Z9221 Personal history of antineoplastic chemotherapy: Secondary | ICD-10-CM | POA: Diagnosis not present

## 2023-10-10 DIAGNOSIS — Z6834 Body mass index (BMI) 34.0-34.9, adult: Secondary | ICD-10-CM | POA: Diagnosis not present

## 2023-10-10 DIAGNOSIS — J440 Chronic obstructive pulmonary disease with acute lower respiratory infection: Secondary | ICD-10-CM | POA: Diagnosis present

## 2023-10-10 DIAGNOSIS — F17210 Nicotine dependence, cigarettes, uncomplicated: Secondary | ICD-10-CM | POA: Diagnosis present

## 2023-10-10 DIAGNOSIS — Z8701 Personal history of pneumonia (recurrent): Secondary | ICD-10-CM | POA: Diagnosis not present

## 2023-10-10 DIAGNOSIS — J189 Pneumonia, unspecified organism: Secondary | ICD-10-CM | POA: Diagnosis present

## 2023-10-10 DIAGNOSIS — Z716 Tobacco abuse counseling: Secondary | ICD-10-CM | POA: Diagnosis not present

## 2023-10-10 DIAGNOSIS — G545 Neuralgic amyotrophy: Secondary | ICD-10-CM | POA: Diagnosis present

## 2023-10-10 DIAGNOSIS — R6520 Severe sepsis without septic shock: Secondary | ICD-10-CM

## 2023-10-10 DIAGNOSIS — Z79899 Other long term (current) drug therapy: Secondary | ICD-10-CM | POA: Diagnosis not present

## 2023-10-10 DIAGNOSIS — Z7951 Long term (current) use of inhaled steroids: Secondary | ICD-10-CM | POA: Diagnosis not present

## 2023-10-10 DIAGNOSIS — E872 Acidosis, unspecified: Secondary | ICD-10-CM | POA: Diagnosis present

## 2023-10-10 DIAGNOSIS — J159 Unspecified bacterial pneumonia: Secondary | ICD-10-CM | POA: Diagnosis present

## 2023-10-10 DIAGNOSIS — N1832 Chronic kidney disease, stage 3b: Secondary | ICD-10-CM | POA: Diagnosis present

## 2023-10-10 DIAGNOSIS — I5033 Acute on chronic diastolic (congestive) heart failure: Secondary | ICD-10-CM | POA: Diagnosis present

## 2023-10-10 DIAGNOSIS — I5031 Acute diastolic (congestive) heart failure: Secondary | ICD-10-CM | POA: Diagnosis not present

## 2023-10-10 DIAGNOSIS — E66812 Obesity, class 2: Secondary | ICD-10-CM | POA: Diagnosis present

## 2023-10-10 DIAGNOSIS — Z85038 Personal history of other malignant neoplasm of large intestine: Secondary | ICD-10-CM | POA: Diagnosis not present

## 2023-10-10 LAB — COMPREHENSIVE METABOLIC PANEL WITH GFR
ALT: 65 U/L — ABNORMAL HIGH (ref 0–44)
AST: 24 U/L (ref 15–41)
Albumin: 2.6 g/dL — ABNORMAL LOW (ref 3.5–5.0)
Alkaline Phosphatase: 91 U/L (ref 38–126)
Anion gap: 11 (ref 5–15)
BUN: 14 mg/dL (ref 8–23)
CO2: 26 mmol/L (ref 22–32)
Calcium: 9.8 mg/dL (ref 8.9–10.3)
Chloride: 96 mmol/L — ABNORMAL LOW (ref 98–111)
Creatinine, Ser: 0.89 mg/dL (ref 0.61–1.24)
GFR, Estimated: >60 mL/min (ref >60)
Glucose, Bld: 155 mg/dL — ABNORMAL HIGH (ref 70–99)
Potassium: 3.2 mmol/L — ABNORMAL LOW (ref 3.5–5.1)
Sodium: 133 mmol/L — ABNORMAL LOW (ref 135–145)
Total Bilirubin: 0.3 mg/dL (ref 0.0–1.2)
Total Protein: 5.8 g/dL — ABNORMAL LOW (ref 6.5–8.1)

## 2023-10-10 LAB — CBC
HCT: 39.6 % (ref 39.0–52.0)
Hemoglobin: 13.3 g/dL (ref 13.0–17.0)
MCH: 33.1 pg (ref 26.0–34.0)
MCHC: 33.6 g/dL (ref 30.0–36.0)
MCV: 98.5 fL (ref 80.0–100.0)
Platelets: 316 K/uL (ref 150–400)
RBC: 4.02 MIL/uL — ABNORMAL LOW (ref 4.22–5.81)
RDW: 13.5 % (ref 11.5–15.5)
WBC: 35.3 K/uL — ABNORMAL HIGH (ref 4.0–10.5)
nRBC: 0 % (ref 0.0–0.2)

## 2023-10-10 LAB — EXPECTORATED SPUTUM ASSESSMENT W GRAM STAIN, RFLX TO RESP C

## 2023-10-10 LAB — RESPIRATORY PANEL BY PCR

## 2023-10-10 LAB — PROCALCITONIN: Procalcitonin: 0.27 ng/mL

## 2023-10-10 LAB — STREP PNEUMONIAE URINARY ANTIGEN: Strep Pneumo Urinary Antigen: NEGATIVE

## 2023-10-10 LAB — MRSA NEXT GEN BY PCR, NASAL: MRSA by PCR Next Gen: NOT DETECTED

## 2023-10-10 LAB — LACTIC ACID, PLASMA
Lactic Acid, Venous: 2.3 mmol/L (ref 0.5–1.9)
Lactic Acid, Venous: 2.3 mmol/L (ref 0.5–1.9)

## 2023-10-10 MED ORDER — OXYCODONE-ACETAMINOPHEN 5-325 MG PO TABS
1.0000 | ORAL_TABLET | Freq: Once | ORAL | Status: AC
  Administered 2023-10-10: 1 via ORAL
  Filled 2023-10-10: qty 1

## 2023-10-10 MED ORDER — OXYCODONE-ACETAMINOPHEN 5-325 MG PO TABS
2.0000 | ORAL_TABLET | Freq: Two times a day (BID) | ORAL | Status: DC | PRN
  Filled 2023-10-10: qty 2

## 2023-10-10 MED ORDER — CHLORHEXIDINE GLUCONATE CLOTH 2 % EX PADS
6.0000 | MEDICATED_PAD | Freq: Every day | CUTANEOUS | Status: DC
  Administered 2023-10-10 – 2023-10-12 (×3): 6 via TOPICAL

## 2023-10-10 MED ORDER — TRAZODONE HCL 50 MG PO TABS
25.0000 mg | ORAL_TABLET | Freq: Every evening | ORAL | Status: DC | PRN
  Filled 2023-10-10: qty 1

## 2023-10-10 MED ORDER — NICOTINE 21 MG/24HR TD PT24
21.0000 mg | MEDICATED_PATCH | Freq: Every day | TRANSDERMAL | Status: DC
  Administered 2023-10-10 – 2023-10-15 (×6): 21 mg via TRANSDERMAL
  Filled 2023-10-10 (×6): qty 1

## 2023-10-10 MED ORDER — FENTANYL CITRATE PF 50 MCG/ML IJ SOSY
50.0000 ug | PREFILLED_SYRINGE | Freq: Once | INTRAMUSCULAR | Status: AC
  Administered 2023-10-10: 50 ug via INTRAVENOUS
  Filled 2023-10-10: qty 1

## 2023-10-10 MED ORDER — ORAL CARE MOUTH RINSE: 15.0000 mL | OROMUCOSAL | Status: DC | PRN

## 2023-10-10 MED ORDER — LACTATED RINGERS IV BOLUS
1000.0000 mL | Freq: Once | INTRAVENOUS | Status: AC
  Administered 2023-10-10: 1000 mL via INTRAVENOUS

## 2023-10-10 MED ORDER — ALBUTEROL SULFATE (2.5 MG/3ML) 0.083% IN NEBU
2.5000 mg | INHALATION_SOLUTION | RESPIRATORY_TRACT | Status: DC | PRN
  Administered 2023-10-10 – 2023-10-11 (×3): 2.5 mg via RESPIRATORY_TRACT
  Filled 2023-10-10 (×3): qty 3

## 2023-10-10 MED ORDER — ONDANSETRON HCL 4 MG/2ML IJ SOLN: 4.0000 mg | Freq: Four times a day (QID) | INTRAMUSCULAR | Status: DC | PRN

## 2023-10-10 MED ORDER — POTASSIUM CHLORIDE CRYS ER 20 MEQ PO TBCR
40.0000 meq | EXTENDED_RELEASE_TABLET | Freq: Once | ORAL | Status: AC
  Administered 2023-10-10: 40 meq via ORAL
  Filled 2023-10-10: qty 2

## 2023-10-10 MED ORDER — OXYCODONE-ACETAMINOPHEN 5-325 MG PO TABS
1.0000 | ORAL_TABLET | Freq: Four times a day (QID) | ORAL | Status: DC | PRN
  Administered 2023-10-10 (×3): 1 via ORAL
  Filled 2023-10-10 (×3): qty 1

## 2023-10-10 MED ORDER — HYDROMORPHONE HCL 1 MG/ML IJ SOLN
1.0000 mg | Freq: Once | INTRAMUSCULAR | Status: AC
  Administered 2023-10-10: 1 mg via INTRAVENOUS
  Filled 2023-10-10: qty 1

## 2023-10-10 MED ORDER — VANCOMYCIN HCL IN DEXTROSE 1-5 GM/200ML-% IV SOLN
1000.0000 mg | Freq: Two times a day (BID) | INTRAVENOUS | Status: AC
  Administered 2023-10-10 – 2023-10-12 (×6): 1000 mg via INTRAVENOUS
  Filled 2023-10-10 (×6): qty 200

## 2023-10-10 MED ORDER — ONDANSETRON HCL 4 MG PO TABS: 4.0000 mg | ORAL_TABLET | Freq: Four times a day (QID) | ORAL | Status: DC | PRN

## 2023-10-10 MED ORDER — ACETAMINOPHEN 650 MG RE SUPP: 650.0000 mg | Freq: Four times a day (QID) | RECTAL | Status: DC | PRN

## 2023-10-10 MED ORDER — SODIUM CHLORIDE 0.9 % IV SOLN
2.0000 g | Freq: Three times a day (TID) | INTRAVENOUS | Status: DC
  Administered 2023-10-10 – 2023-10-15 (×15): 2 g via INTRAVENOUS
  Filled 2023-10-10 (×15): qty 12.5

## 2023-10-10 MED ORDER — ACETAMINOPHEN 325 MG PO TABS: 650.0000 mg | ORAL_TABLET | Freq: Four times a day (QID) | ORAL | Status: DC | PRN

## 2023-10-10 MED ORDER — ENOXAPARIN SODIUM 40 MG/0.4ML IJ SOSY
40.0000 mg | PREFILLED_SYRINGE | INTRAMUSCULAR | Status: DC
  Administered 2023-10-10 – 2023-10-14 (×5): 40 mg via SUBCUTANEOUS
  Filled 2023-10-10 (×5): qty 0.4

## 2023-10-10 MED ORDER — ALUM & MAG HYDROXIDE-SIMETH 200-200-20 MG/5ML PO SUSP
15.0000 mL | ORAL | Status: AC | PRN
  Administered 2023-10-10 – 2023-10-13 (×2): 15 mL via ORAL
  Filled 2023-10-10 (×2): qty 30

## 2023-10-10 MED ORDER — LORAZEPAM 1 MG PO TABS
1.0000 mg | ORAL_TABLET | Freq: Four times a day (QID) | ORAL | Status: DC | PRN
  Administered 2023-10-10 – 2023-10-12 (×3): 1 mg via ORAL
  Filled 2023-10-10 (×3): qty 1

## 2023-10-10 NOTE — Progress Notes (Signed)
 Pharmacy Antibiotic Note  [name] is a 68 y.o. male admitted on 10/09/2023 with pneumonia. Pharmacy has been consulted for vancomycin  and cefepime  dosing. Pt is afebrile. WBC and lactic acid are elevated. He was recently hospitalized with pneumonia as well. First dose of antibiotics given last night.   Plan: Vancomycin  1g IV Q12H F/u renal fxn, C&S, clinical status and peak/trough at SS  Weight: 103.9 kg (229 lb)  Temp (24hrs), Avg:98.3 F (36.8 C), Min:97.5 F (36.4 C), Max:98.8 F (37.1 C)  Recent Labs  Lab 10/09/23 1730 10/09/23 1818 10/09/23 2110 10/10/23 0200 10/10/23 0544 10/10/23 0615  WBC 35.8*  --   --   --  35.3*  --   CREATININE 1.02  --   --   --   --  0.89  LATICACIDVEN  --  2.5* 2.8* 2.3* 2.3*  --     Estimated Creatinine Clearance: 92.8 mL/min (by C-G formula based on SCr of 0.89 mg/dL).    No Known Allergies  Antimicrobials this admission: Vanc 9/11>> Cefepime  9/11>>  Dose adjustments this admission: N/A  Microbiology results: Pending  Thank you for allowing pharmacy to be a part of this patient's care.  Garo Heidelberg, Vernell Helling 10/10/2023 8:07 AM

## 2023-10-10 NOTE — H&P (Signed)
 History and Physical  BOEN STERBENZ FMW:[id number] DOB: 07/26/1955 DOA: 10/09/2023  PCP: Renato Dorothey HERO, NP   Chief Complaint: Weakness, cough, left-sided chest pain  HPI: [name] is a 68 y.o. male with medical history significant for COPD on room air, CKD stage III, ongoing tobacco abuse hospitalized August 2025 with community-acquired pneumonia and severe sepsis who is now being admitted to the hospital with recurrent severe sepsis due to multifocal pneumonia.  Patient states that when he was discharged home approximately 1 month ago and completed his oral antibiotic, he felt that he never completely got back to baseline.  He had persistent dyspnea with exertion and generalized weakness.  States that over the last few days, his generalized weakness and fatigue have worsened, in the last 48 hours he has also developed pretty severe left-sided chest pain that is worse with his nonproductive cough.  It also hurts when he takes deep breaths.  He was worked up this morning at the [facility], where he had CT scan of the chest which shows no PE but bilateral pulmonary consolidation.  He was started on broad-spectrum IV antibiotics, and admitted to the hospitalist service.  He was started on IV fluids with caution, due to concern for lower extremity edema which she says he developed during his last hospitalization.  Review of Systems: Please see HPI for pertinent positives and negatives. A complete 10 system review of systems are otherwise negative.  Past Medical History:  Diagnosis Date   Anemia    IRON 2005   Anxiety    Arthritis    LOWER LUMBAR STENOSIS , ARTHRITIS HANDS AND SHOULDERS   Asthma    Cancer (HCC) 2005   COLON SURGERY AND CHEMO   Complication of anesthesia    WOKE UP DURING PORT PLACEMENT AND ENDOSCOPY, SLOW TO AWAKEN FROM COLON RESECTION 2010   COPD (chronic obstructive pulmonary disease) (HCC)    Dyspnea    WITH EXERTION   GERD (gastroesophageal reflux  disease)    Hydrocele, right    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    Pneumonia 2016 LAST    X 3 EPISODES IN PAST   Vitamin D deficiency    Past Surgical History:  Procedure Laterality Date   COLON RESECTION  2010   HERNIA REPAIR     HYDROCELE EXCISION Right 12/16/2016   Procedure: HYDROCELECTOMY ADULT RGHT;  Surgeon: Carolee Sherwood JONETTA DOUGLAS, MD;  Location: [facility];  Service: Urology;  Laterality: Right;   PORT PLACEMENT     LEFT CHEST   UPPER GI ENDOSCOPY     Social History:  reports that he has been smoking cigarettes. He has a 45 pack-year smoking history. He has never used smokeless tobacco. He reports that he does not drink alcohol and does not use drugs.  No Known Allergies  History reviewed. No pertinent family history.   Prior to Admission medications   Medication Sig Start Date End Date Taking? Authorizing Provider  predniSONE  (STERAPRED UNI-PAK 21 TAB) 10 MG (21) TBPK tablet Take 10 mg by mouth as directed. **Dose pack** 10/03/23  Yes [provider]  promethazine (PHENERGAN) 25 MG tablet Take 25 mg by mouth every 6 (six) hours as needed. 10/02/23  Yes [provider]  albuterol  (PROVENTIL ) (2.5 MG/3ML) 0.083% nebulizer solution Take 3 mLs (2.5 mg total) by nebulization every 4 (four) hours as needed for wheezing or shortness of breath. 01/15/21   Pearlean Manus, MD  albuterol  (VENTOLIN   HFA) 108 (90 Base) MCG/ACT inhaler Inhale 2 puffs into the lungs every 4 (four) hours as needed. 05/13/23   Renato Dorothey HERO, NP  b complex vitamins capsule Take 1 capsule by mouth daily.    [provider]  calcium  carbonate (TUMS - DOSED IN MG ELEMENTAL CALCIUM ) 500 MG chewable tablet Chew 500-1,000 mg by mouth as needed for indigestion or heartburn.    [provider]  chlorthalidone  (HYGROTON ) 25 MG tablet Take 1 tablet (25 mg total) by mouth every morning with food. 02/24/23   Renato Dorothey HERO, NP  Cholecalciferol  (VITAMIN D3) 125 MCG (5000 UT) TABS Take 10,000 Units by mouth daily.    [provider]  ipratropium-albuterol  (DUONEB) 0.5-2.5 (3) MG/3ML SOLN Inhale 3 mLs into the lungs every 6 (six) hours as needed. 09/09/23 10/09/23  Regalado, Belkys A, MD  linaclotide (LINZESS) 145 MCG CAPS capsule Take 145 mcg by mouth every other day.    [provider]  Magnesium 300 MG TABS Take 600 mg by mouth daily.    [provider]  oxyCODONE -acetaminophen  (PERCOCET/ROXICET) 5-325 MG tablet Take 1 tablet by mouth every 6 (six) hours as needed for severe pain or moderate pain. Patient taking differently: Take 1 tablet by mouth 4 (four) times daily as needed for severe pain (pain score 7-10) or moderate pain (pain score 4-6). 12/29/20   Lorriane Holmes, MD  tamsulosin  (FLOMAX ) 0.4 MG CAPS capsule Take 1 capsule (0.4 mg total) by mouth 2 (two) times daily. 07/03/23   Carolee Sherwood JONETTA DOUGLAS, MD  TRELEGY ELLIPTA 100-62.5-25 MCG/ACT AEPB Inhale 1 puff into the lungs daily. 08/15/21   [provider]    Physical Exam: BP 135/61 (BP Location: Right Arm)   Pulse (!) 104   Temp 98.2 F (36.8 C) (Oral)   Resp (!) 25   Ht 5' 8 (1.727 m)   Wt 99.2 kg   SpO2 (!) 87%   BMI 33.25 kg/m  General:  Alert, oriented, calm, in no acute distress, looks tired but nontoxic.  Resting comfortably on room air, speaking in full sentences sitting up in a chair at the bedside.  His friend and ex-wife is at the bedside. Cardiovascular: RRR, no murmurs or rubs, no peripheral edema  Respiratory: clear to auscultation bilaterally but diminished at the left upper lung zone, no wheezes, no crackles  Abdomen: soft, nontender, nondistended, normal bowel tones heard  Skin: dry, no rashes  Musculoskeletal: no joint effusions, normal range of motion  Psychiatric: appropriate affect, normal speech  Neurologic: extraocular muscles intact, clear speech, moving all extremities with intact sensorium         Labs on  Admission:  Basic Metabolic Panel: Recent Labs  Lab 10/09/23 1730 10/10/23 0615  NA 134* 133*  K 3.8 3.2*  CL 94* 96*  CO2 27 26  GLUCOSE 116* 155*  BUN 11 14  CREATININE 1.02 0.89  CALCIUM  10.1 9.8   Liver Function Tests: Recent Labs  Lab 10/10/23 0615  AST 24  ALT 65*  ALKPHOS 91  BILITOT 0.3  PROT 5.8*  ALBUMIN 2.6*   No results for input(s): LIPASE, AMYLASE in the last 168 hours. No results for input(s): AMMONIA in the last 168 hours. CBC: Recent Labs  Lab 10/09/23 1730 10/10/23 0544  WBC 35.8* 35.3*  HGB 15.1 13.3  HCT 45.2 39.6  MCV 98.0 98.5  PLT 368 316   Cardiac Enzymes: No results for input(s): CKTOTAL, CKMB, CKMBINDEX, TROPONINI in the last 168 hours.  BNP (last 3 results) No results for input(s): BNP in the last 8760 hours.  ProBNP (last 3 results) Recent Labs    09/05/23 0126 10/09/23 1818  PROBNP 559.0* 650.0*    CBG: No results for input(s): GLUCAP in the last 168 hours.  Radiological Exams on Admission: CT Angio Chest PE W and/or Wo Contrast Result Date: 10/09/2023 CLINICAL DATA:  Concern for pulmonary embolism. Shortness of breath. EXAM: CT ANGIOGRAPHY CHEST WITH CONTRAST TECHNIQUE: Multidetector CT imaging of the chest was performed using the standard protocol during bolus administration of intravenous contrast. Multiplanar CT image reconstructions and MIPs were obtained to evaluate the vascular anatomy. RADIATION DOSE REDUCTION: This exam was performed according to the departmental dose-optimization program which includes automated exposure control, adjustment of the mA and/or kV according to patient size and/or use of iterative reconstruction technique. CONTRAST:  75mL OMNIPAQUE  IOHEXOL  350 MG/ML SOLN COMPARISON:  Chest CT dated 09/05/2023. FINDINGS: Cardiovascular: There is no cardiomegaly or pericardial effusion. There is coronary vascular calcification of the LAD. Mild atherosclerotic calcification of the thoracic aorta.  No aneurysmal dilatation. No pulmonary artery embolus identified. Mediastinum/Nodes: No hilar or mediastinal adenopathy. The esophagus is grossly unremarkable no mediastinal fluid collection. Lungs/Pleura: Background of emphysema. New area of consolidation in the superior segment of the left lower lobe consistent with pneumonia. Large area of consolidation in the right upper lobe the has slightly contracted since the prior CT. Several small cystic spaces within the consolidation measuring up to 2.7 x 1.6 cm may be related to background of emphysema or represent cavitary changes. Underlying mass is not excluded. Continued follow-up to resolution recommended. There is no pleural effusion or pneumothorax. The central airways are patent. Upper Abdomen: Partially visualized midline ventral hernia containing a segment of the transverse colon. Small bilateral renal nonobstructing calculi. Musculoskeletal: Degenerative changes of the spine. No acute osseous pathology. Review of the MIP images confirms the above findings. IMPRESSION: 1. No CT evidence of pulmonary artery embolus. 2. New area of consolidation in the superior segment of the left lower lobe consistent with pneumonia. Large area of consolidation in the right upper lobe. Follow-up to resolution recommended. 3. Partially visualized midline ventral hernia containing a segment of the transverse colon. 4. Small bilateral renal nonobstructing calculi. 5. Aortic Atherosclerosis (ICD10-I70.0) and Emphysema (ICD10-J43.9). Electronically Signed   By: Vanetta Chou M.D.   On: 10/09/2023 18:55   DG Chest 2 View Result Date: 10/09/2023 CLINICAL DATA:  Chest pain. EXAM: CHEST - 2 VIEW COMPARISON:  September 07, 2023. FINDINGS: The heart size and mediastinal contours are within normal limits. Left subclavian Port-A-Cath is unchanged. Cavitary lesion is now noted in right upper lobe concerning for cavitary pneumonia or possibly malignancy. Increased left upper lobe opacity  is noted concerning for pneumonia. Minimal left basilar subsegmental atelectasis or scarring is noted. The visualized skeletal structures are unremarkable. IMPRESSION: Cavitary lesion is now noted in right upper lobe concerning for cavitary pneumonia or possibly malignancy. Increased left upper lobe opacity is noted concerning for pneumonia. CT scan of the chest is recommended for further evaluation. Electronically Signed   By: Lynwood Landy Raddle M.D.   On: 10/09/2023 17:45   Assessment/Plan [name] is a 68 y.o. male with medical history significant for COPD on room air, CKD stage III, ongoing tobacco abuse hospitalized August 2025 with community-acquired pneumonia and severe sepsis who is now being admitted to the hospital with recurrent severe sepsis due to multifocal pneumonia.  Severe sepsis-meeting criteria with leukocytosis, tachycardia, lactate  2.3.  Source is multifocal pneumonia.  Possible that he never cleared the pneumonia for which she was admitted to the hospital about 1 month ago. -Inpatient admission -Monitor closely on stepdown -Empiric IV vancomycin  and IV cefepime  -Negative respiratory virus panel  Volume overload-patient with elevated BNP, looks moderately volume overloaded on exam, has pitting bilateral lower extremity edema.  Suspect volume overload related to fluid resuscitation during his prior hospitalization.  Last echo 2022 without obvious abnormality, though difficult windows.  Note that he did receive a dose of IV Lasix  on the evening of 9/11. 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 Inpatient status is judged to be reasonable and necessary in order to provide the required intensity of service to ensure the patient's safety. The patient's presenting symptoms, physical exam findings, and initial radiographic and laboratory data in the context of their chronic comorbidities is felt to place them at high risk for further clinical deterioration. Furthermore, it is not anticipated that the patient will be medically stable for discharge from the hospital within 2 midnights of admission.    I certify that at the point of admission it is my clinical judgment that the patient will require inpatient hospital care spanning beyond 2 midnights from the point of admission due to high intensity of service, high risk for further deterioration and high frequency of surveillance required  Time spent: 55 minutes  Natausha Jungwirth CHRISTELLA Gail MD [facility] Pager [phone]  If 7PM-7AM, please contact night-coverage [email or web address] Password [email or web address]  10/10/2023, 4:33 PM

## 2023-10-10 NOTE — ED Notes (Signed)
 Called Karen at [facility] for transport

## 2023-10-10 NOTE — Plan of Care (Signed)

## 2023-10-10 NOTE — ED Notes (Signed)
 Patient refuses to wear cardiac monitor. Agrees to having vitals taken intermittently. SPO2 level between 88-94%. Refuses to wear O2. Pain medication administered and IV antibiotics started. Provided patient with apple juice upon request.

## 2023-10-10 NOTE — ED Notes (Signed)
 Pt called for nebulizer tx.  Pt was given duo neb that was ordered.  Pts sat were 88-89% on RA while laying down.  I attempted to place pt on 02 via [place] and pt refused.  I sat pt up for his nebulizer tx and his sats increased to 90-91%.  PT was adamant about not wearing oxygen.  Pt stated 02 doesn't help, it stays low even on oxygen.  RN made aware.

## 2023-10-11 ENCOUNTER — Inpatient Hospital Stay (HOSPITAL_COMMUNITY)

## 2023-10-11 DIAGNOSIS — I5031 Acute diastolic (congestive) heart failure: Secondary | ICD-10-CM | POA: Diagnosis not present

## 2023-10-11 DIAGNOSIS — J189 Pneumonia, unspecified organism: Secondary | ICD-10-CM

## 2023-10-11 DIAGNOSIS — A419 Sepsis, unspecified organism: Secondary | ICD-10-CM | POA: Diagnosis not present

## 2023-10-11 LAB — BASIC METABOLIC PANEL WITH GFR
Anion gap: 9 (ref 5–15)
BUN: 10 mg/dL (ref 8–23)
CO2: 27 mmol/L (ref 22–32)
Calcium: 9.4 mg/dL (ref 8.9–10.3)
Chloride: 95 mmol/L — ABNORMAL LOW (ref 98–111)
Creatinine, Ser: 0.8 mg/dL (ref 0.61–1.24)
GFR, Estimated: >60 mL/min (ref >60)
Glucose, Bld: 96 mg/dL (ref 70–99)
Potassium: 3.5 mmol/L (ref 3.5–5.1)
Sodium: 131 mmol/L — ABNORMAL LOW (ref 135–145)

## 2023-10-11 LAB — LEGIONELLA PNEUMOPHILA SEROGP 1 UR AG: L. pneumophila Serogp 1 Ur Ag: NEGATIVE

## 2023-10-11 LAB — CBC
HCT: 39.5 % (ref 39.0–52.0)
Hemoglobin: 12.8 g/dL — ABNORMAL LOW (ref 13.0–17.0)
MCH: 32.5 pg (ref 26.0–34.0)
MCHC: 32.4 g/dL (ref 30.0–36.0)
MCV: 100.3 fL — ABNORMAL HIGH (ref 80.0–100.0)
Platelets: 299 K/uL (ref 150–400)
RBC: 3.94 MIL/uL — ABNORMAL LOW (ref 4.22–5.81)
RDW: 13.4 % (ref 11.5–15.5)
WBC: 32.7 K/uL — ABNORMAL HIGH (ref 4.0–10.5)
nRBC: 0 % (ref 0.0–0.2)

## 2023-10-11 LAB — ECHOCARDIOGRAM COMPLETE
AR max vel: 2.71 cm2
AV Area VTI: 3.12 cm2
AV Area mean vel: 2.59 cm2
AV Mean grad: 5 mmHg
AV Peak grad: 8 mmHg
Ao pk vel: 1.41 m/s
Area-P 1/2: 3.02 cm2
Height: 68 in
Weight: 3499.14 [oz_av]

## 2023-10-11 LAB — LACTIC ACID, PLASMA
Lactic Acid, Venous: 1.9 mmol/L (ref 0.5–1.9)
Lactic Acid, Venous: 1.8 mmol/L (ref 0.5–1.9)

## 2023-10-11 MED ORDER — TAMSULOSIN HCL 0.4 MG PO CAPS
0.8000 mg | ORAL_CAPSULE | Freq: Every day | ORAL | Status: DC
  Administered 2023-10-11 – 2023-10-15 (×5): 0.8 mg via ORAL
  Filled 2023-10-11 (×5): qty 2

## 2023-10-11 MED ORDER — IPRATROPIUM-ALBUTEROL 0.5-2.5 (3) MG/3ML IN SOLN
3.0000 mL | RESPIRATORY_TRACT | Status: DC | PRN
  Administered 2023-10-12 (×3): 3 mL via RESPIRATORY_TRACT
  Filled 2023-10-11 (×3): qty 3

## 2023-10-11 MED ORDER — OXYCODONE-ACETAMINOPHEN 5-325 MG PO TABS
2.0000 | ORAL_TABLET | Freq: Four times a day (QID) | ORAL | Status: DC | PRN
  Administered 2023-10-11 – 2023-10-15 (×13): 2 via ORAL
  Filled 2023-10-11 (×13): qty 2

## 2023-10-11 MED ORDER — PERFLUTREN LIPID MICROSPHERE
1.0000 mL | INTRAVENOUS | Status: AC | PRN
  Administered 2023-10-11: 2 mL via INTRAVENOUS

## 2023-10-11 MED ORDER — OXYCODONE-ACETAMINOPHEN 5-325 MG PO TABS
2.0000 | ORAL_TABLET | Freq: Two times a day (BID) | ORAL | Status: DC | PRN
  Administered 2023-10-11: 2 via ORAL

## 2023-10-11 NOTE — Progress Notes (Signed)
 PROGRESS NOTE    [name]  FMW:[id number] DOB: 1955-04-24 DOA: 10/09/2023 PCP: Renato Dorothey HERO, NP    Brief Narrative:  68 year old with history of COPD usually on room air, community dwelling, ongoing smoker with recently treated pneumonia about a month ago completed antibiotics presented back to the emergency room with persistent dyspnea with exertion, generalized weakness and over the last 2 days pleuritic pain on the left side of the chest worse with nonproductive cough. At the emergency room, blood pressure initially 67/33-responded to IV fluids.  Lactic acid was 2.3.  Responded to IV fluids.  WBC count 35.3.  Chest x-ray and CT scan consistent with bilateral pneumonia.  Admitted with broad-spectrum antibiotics.  Subjective: Patient seen and examined in the morning rounds.  He was getting echocardiogram.  Patient tells me that he feels much better today.  He has pain with taking deep breaths and dry cough.  He has collected some thin mucoid looking sputum on the sputum cup. Afebrile overnight.  Assessment & Plan:   Severe sepsis secondary to bilateral, multifocal pneumonia.  Presented with leukocytosis, lactic acidosis and tachycardia.  CT scan with bilateral pneumonia.  No cavitation on the CT scan. Antibiotics to treat bacterial pneumonia, continue vancomycin  and cefepime  today given recurrent pneumonia. Chest physiotherapy, incentive spirometry, deep breathing exercises, sputum induction, mucolytic's and bronchodilators. Sputum cultures, blood cultures, Legionella and streptococcal antigen.  So far. Supplemental oxygen to keep saturations more than 90%. RVP panel negative.  COVID, flu and RSV negative.  Bilateral leg swelling: Recently started.  Elevated BNP.  Previous normal echo.  New echocardiogram done today.  Results pending.  Holding diuresis given sepsis.  Smoker: Counseled to quit.  Nicotine  patch.  Hypokalemia: Replaced.  Obesity class II: Recommended  weight loss.  DVT prophylaxis: enoxaparin  (LOVENOX ) injection 40 mg Start: 10/10/23 2200   Code Status: Full code Family Communication: None at the bedside Disposition Plan: Status is: Inpatient Remains inpatient appropriate because: Significant symptoms, IV antibiotics Patient has  stabilized, lactic acid has normalized.  He can be transferred out of stepdown unit to telemonitoring unit.     Consultants:  None  Procedures:  None  Antimicrobials:  Vancomycin  and cefepime  9/12---     Objective: Vitals:   10/11/23 0700 10/11/23 0800 10/11/23 0900 10/11/23 1000  BP: (!) 101/48 (!) 101/48  (!) 96/37  Pulse: (!) 103 (!) 119 (!) 113 93  Resp: (!) 22 (!) 23 (!) 26 (!) 22  Temp:  98.4 F (36.9 C)    TempSrc:  Oral    SpO2: 92% 91% 92% 93%  Weight:      Height:        Intake/Output Summary (Last 24 hours) at 10/11/2023 1139 Last data filed at 10/11/2023 1016 Gross per 24 hour  Intake 866.52 ml  Output 100 ml  Net 766.52 ml   Filed Weights   10/10/23 0800 10/10/23 1335  Weight: 103.9 kg 99.2 kg    Examination:  General exam: Appears calm and comfortable.  Looks comfortable.  Pleasant and interactive. Respiratory system: Clear to auscultation. Respiratory effort normal.  Pleuritic pain on deep breathing. Cardiovascular system: S1 & S2 heard, RRR. No JVD, murmurs, rubs, gallops or clicks.  2+ bilateral pedal edema.. Gastrointestinal system: Abdomen is nondistended, soft and nontender. No organomegaly or masses felt. Normal bowel sounds heard. Central nervous system: Alert and oriented. No focal neurological deficits. Extremities: Symmetric 5 x 5 power. Skin: No rashes, lesions or ulcers Psychiatry: Judgement and insight appear normal. Mood &  affect appropriate.     Data Reviewed: I have personally reviewed following labs and imaging studies  CBC: Recent Labs  Lab 10/09/23 1730 10/10/23 0544 10/11/23 0302  WBC 35.8* 35.3* 32.7*  HGB 15.1 13.3 12.8*  HCT 45.2  39.6 39.5  MCV 98.0 98.5 100.3*  PLT 368 316 299   Basic Metabolic Panel: Recent Labs  Lab 10/09/23 1730 10/10/23 0615 10/11/23 0302  NA 134* 133* 131*  K 3.8 3.2* 3.5  CL 94* 96* 95*  CO2 27 26 27   GLUCOSE 116* 155* 96  BUN 11 14 10   CREATININE 1.02 0.89 0.80  CALCIUM  10.1 9.8 9.4   GFR: Estimated Creatinine Clearance: 100.9 mL/min (by C-G formula based on SCr of 0.8 mg/dL). Liver Function Tests: Recent Labs  Lab 10/10/23 0615  AST 24  ALT 65*  ALKPHOS 91  BILITOT 0.3  PROT 5.8*  ALBUMIN 2.6*   No results for input(s): LIPASE, AMYLASE in the last 168 hours. No results for input(s): AMMONIA in the last 168 hours. Coagulation Profile: Recent Labs  Lab 10/09/23 1818  INR 1.1   Cardiac Enzymes: No results for input(s): CKTOTAL, CKMB, CKMBINDEX, TROPONINI in the last 168 hours. BNP (last 3 results) Recent Labs    09/05/23 0126 10/09/23 1818  PROBNP 559.0* 650.0*   HbA1C: No results for input(s): HGBA1C in the last 72 hours. CBG: No results for input(s): GLUCAP in the last 168 hours. Lipid Profile: No results for input(s): CHOL, HDL, LDLCALC, TRIG, CHOLHDL, LDLDIRECT in the last 72 hours. Thyroid Function Tests: No results for input(s): TSH, T4TOTAL, FREET4, T3FREE, THYROIDAB in the last 72 hours. Anemia Panel: No results for input(s): VITAMINB12, FOLATE, FERRITIN, TIBC, IRON, RETICCTPCT in the last 72 hours. Sepsis Labs: Recent Labs  Lab 10/09/23 2110 10/09/23 2216 10/10/23 0200 10/10/23 0544 10/11/23 0918  PROCALCITON  --  0.27  --   --   --   LATICACIDVEN 2.8*  --  2.3* 2.3* 1.9    Recent Results (from the past 240 hours)  Culture, blood (Routine x 2)     Status: None (Preliminary result)   Collection Time: 10/09/23  5:55 PM   Specimen: BLOOD  Result Value Ref Range Status   Specimen Description   Final    BLOOD BLOOD LEFT FOREARM Performed at [facility], [address], [place], [place] [address]    Special Requests   Final    BOTTLES DRAWN AEROBIC AND ANAEROBIC Blood Culture adequate volume Performed at [facility], [address] [place], [place] [address]    Culture   Final    NO GROWTH 2 DAYS Performed at [facility], [address], [place], [place] [address]    Report Status PENDING  Incomplete  Culture, blood (Routine x 2)     Status: None (Preliminary result)   Collection Time: 10/09/23  8:10 PM   Specimen: BLOOD  Result Value Ref Range Status   Specimen Description   Final    BLOOD RIGHT ANTECUBITAL Performed at [facility], [address], [place], [place] [address]    Special Requests   Final    BOTTLES DRAWN AEROBIC AND ANAEROBIC Blood Culture adequate volume Performed at [facility], [address] [place], [place] [address]    Culture   Final    NO GROWTH 2 DAYS Performed at [facility], [address] [place], [place] [address]    Report Status PENDING  Incomplete  Respiratory (~20  pathogens) panel by PCR     Status: None   Collection Time: 10/09/23 10:16 PM   Specimen: Nasopharyngeal Swab; Respiratory  Result Value Ref Range Status   Adenovirus NOT DETECTED NOT DETECTED Final   Coronavirus 229E NOT DETECTED NOT DETECTED Final    Comment: (NOTE) The Coronavirus on the Respiratory Panel, DOES NOT test for the novel  Coronavirus (2019 nCoV)    Coronavirus HKU1 NOT DETECTED NOT DETECTED Final   Coronavirus NL63 NOT DETECTED NOT DETECTED Final   Coronavirus OC43 NOT DETECTED NOT DETECTED Final   Metapneumovirus NOT DETECTED NOT DETECTED Final   Rhinovirus / Enterovirus NOT DETECTED NOT DETECTED Final   Influenza A NOT DETECTED NOT DETECTED Final   Influenza B NOT DETECTED NOT DETECTED Final   Parainfluenza Virus 1 NOT DETECTED NOT DETECTED Final   Parainfluenza Virus 2 NOT DETECTED NOT DETECTED Final   Parainfluenza Virus 3 NOT DETECTED NOT  DETECTED Final   Parainfluenza Virus 4 NOT DETECTED NOT DETECTED Final   Respiratory Syncytial Virus NOT DETECTED NOT DETECTED Final   Bordetella pertussis NOT DETECTED NOT DETECTED Final   Bordetella Parapertussis NOT DETECTED NOT DETECTED Final   Chlamydophila pneumoniae NOT DETECTED NOT DETECTED Final   Mycoplasma pneumoniae NOT DETECTED NOT DETECTED Final    Comment: Performed at [facility], [address] [place], [place] [address]  Expectorated Sputum Assessment w Gram Stain, Rflx to Resp Cult     Status: None   Collection Time: 10/09/23 10:16 PM   Specimen: Sputum  Result Value Ref Range Status   Specimen Description   Final    SPU Performed at [facility], [address], [place], [place] [address]    Special Requests   Final    Immunocompromised Performed at [facility], [address] [place], [place] [address]    Sputum evaluation   Final    THIS SPECIMEN IS ACCEPTABLE FOR SPUTUM CULTURE Performed at [facility], [address], [place], [place] [address]    Report Status 10/10/2023 FINAL  Final  Culture, Respiratory w Gram Stain     Status: None (Preliminary result)   Collection Time: 10/09/23 10:16 PM   Specimen: Sputum  Result Value Ref Range Status   Specimen Description SPU  Final   Special Requests Immunocompromised Reflexed from [id number]  Final   Gram Stain   Final    ABUNDANT WBC PRESENT, PREDOMINANTLY PMN NO ORGANISMS SEEN Performed at [facility], [address], [place], [place] [address]    Culture PENDING  Incomplete   Report Status PENDING  Incomplete  MRSA Next Gen by PCR, Nasal     Status: None   Collection Time: 10/10/23  1:40 PM   Specimen: Nasal Mucosa; Nasal Swab  Result Value Ref Range Status   MRSA by PCR Next Gen NOT DETECTED NOT DETECTED Final    Comment: (NOTE) The GeneXpert MRSA Assay (FDA approved for NASAL specimens only), is one component of a comprehensive MRSA  colonization surveillance program. It is not intended to diagnose MRSA infection nor to guide or monitor treatment for MRSA infections. Test performance is not FDA approved in patients less than 52 years old. Performed at [facility], [address] [place], [place] [address]          Radiology Studies: CT Angio Chest PE W and/or Wo Contrast Result Date: 10/09/2023 CLINICAL DATA:  Concern for pulmonary embolism. Shortness of breath. EXAM: CT ANGIOGRAPHY CHEST  WITH CONTRAST TECHNIQUE: Multidetector CT imaging of the chest was performed using the standard protocol during bolus administration of intravenous contrast. Multiplanar CT image reconstructions and MIPs were obtained to evaluate the vascular anatomy. RADIATION DOSE REDUCTION: This exam was performed according to the departmental dose-optimization program which includes automated exposure control, adjustment of the mA and/or kV according to patient size and/or use of iterative reconstruction technique. CONTRAST:  75mL OMNIPAQUE  IOHEXOL  350 MG/ML SOLN COMPARISON:  Chest CT dated 09/05/2023. FINDINGS: Cardiovascular: There is no cardiomegaly or pericardial effusion. There is coronary vascular calcification of the LAD. Mild atherosclerotic calcification of the thoracic aorta. No aneurysmal dilatation. No pulmonary artery embolus identified. Mediastinum/Nodes: No hilar or mediastinal adenopathy. The esophagus is grossly unremarkable no mediastinal fluid collection. Lungs/Pleura: Background of emphysema. New area of consolidation in the superior segment of the left lower lobe consistent with pneumonia. Large area of consolidation in the right upper lobe the has slightly contracted since the prior CT. Several small cystic spaces within the consolidation measuring up to 2.7 x 1.6 cm may be related to background of emphysema or represent cavitary changes. Underlying mass is not excluded. Continued follow-up to resolution recommended.  There is no pleural effusion or pneumothorax. The central airways are patent. Upper Abdomen: Partially visualized midline ventral hernia containing a segment of the transverse colon. Small bilateral renal nonobstructing calculi. Musculoskeletal: Degenerative changes of the spine. No acute osseous pathology. Review of the MIP images confirms the above findings. IMPRESSION: 1. No CT evidence of pulmonary artery embolus. 2. New area of consolidation in the superior segment of the left lower lobe consistent with pneumonia. Large area of consolidation in the right upper lobe. Follow-up to resolution recommended. 3. Partially visualized midline ventral hernia containing a segment of the transverse colon. 4. Small bilateral renal nonobstructing calculi. 5. Aortic Atherosclerosis (ICD10-I70.0) and Emphysema (ICD10-J43.9). Electronically Signed   By: Vanetta Chou M.D.   On: 10/09/2023 18:55   DG Chest 2 View Result Date: 10/09/2023 CLINICAL DATA:  Chest pain. EXAM: CHEST - 2 VIEW COMPARISON:  September 07, 2023. FINDINGS: The heart size and mediastinal contours are within normal limits. Left subclavian Port-A-Cath is unchanged. Cavitary lesion is now noted in right upper lobe concerning for cavitary pneumonia or possibly malignancy. Increased left upper lobe opacity is noted concerning for pneumonia. Minimal left basilar subsegmental atelectasis or scarring is noted. The visualized skeletal structures are unremarkable. IMPRESSION: Cavitary lesion is now noted in right upper lobe concerning for cavitary pneumonia or possibly malignancy. Increased left upper lobe opacity is noted concerning for pneumonia. CT scan of the chest is recommended for further evaluation. Electronically Signed   By: Lynwood Landy Raddle M.D.   On: 10/09/2023 17:45        Scheduled Meds:  Chlorhexidine  Gluconate Cloth  6 each Topical Daily   enoxaparin  (LOVENOX ) injection  40 mg Subcutaneous Q24H   guaiFENesin   1,200 mg Oral BID   nicotine    21 mg Transdermal Daily   tamsulosin   0.8 mg Oral QPC breakfast   Continuous Infusions:  ceFEPime  (MAXIPIME ) IV Stopped (10/11/23 0841)   vancomycin  Stopped (10/11/23 1010)     LOS: 1 day    Time spent: 55 minutes    Renato Applebaum, MD [facility]

## 2023-10-11 NOTE — Plan of Care (Signed)
  Problem: Clinical Measurements: Goal: Respiratory complications will improve Outcome: Progressing   Problem: Clinical Measurements: Goal: Diagnostic test results will improve Outcome: Progressing   Problem: Clinical Measurements: Goal: Will remain free from infection Outcome: Progressing   Problem: Nutrition: Goal: Adequate nutrition will be maintained Outcome: Progressing

## 2023-10-12 DIAGNOSIS — J189 Pneumonia, unspecified organism: Secondary | ICD-10-CM | POA: Diagnosis not present

## 2023-10-12 DIAGNOSIS — A419 Sepsis, unspecified organism: Secondary | ICD-10-CM | POA: Diagnosis not present

## 2023-10-12 LAB — COMPREHENSIVE METABOLIC PANEL WITH GFR
ALT: 86 U/L — ABNORMAL HIGH (ref 0–44)
AST: 56 U/L — ABNORMAL HIGH (ref 15–41)
Albumin: 2.5 g/dL — ABNORMAL LOW (ref 3.5–5.0)
Alkaline Phosphatase: 148 U/L — ABNORMAL HIGH (ref 38–126)
Anion gap: 12 (ref 5–15)
BUN: 10 mg/dL (ref 8–23)
CO2: 26 mmol/L (ref 22–32)
Calcium: 9.8 mg/dL (ref 8.9–10.3)
Chloride: 95 mmol/L — ABNORMAL LOW (ref 98–111)
Creatinine, Ser: 0.8 mg/dL (ref 0.61–1.24)
GFR, Estimated: >60 mL/min (ref >60)
Glucose, Bld: 88 mg/dL (ref 70–99)
Potassium: 3.9 mmol/L (ref 3.5–5.1)
Sodium: 133 mmol/L — ABNORMAL LOW (ref 135–145)
Total Bilirubin: 0.4 mg/dL (ref 0.0–1.2)
Total Protein: 6.3 g/dL — ABNORMAL LOW (ref 6.5–8.1)

## 2023-10-12 LAB — CBC WITH DIFFERENTIAL/PLATELET
Abs Immature Granulocytes: 0.56 K/uL — ABNORMAL HIGH (ref 0.00–0.07)
Basophils Absolute: 0.1 K/uL (ref 0.0–0.1)
Basophils Relative: 0 %
Eosinophils Absolute: 0.7 K/uL — ABNORMAL HIGH (ref 0.0–0.5)
Eosinophils Relative: 2 %
HCT: 37.2 % — ABNORMAL LOW (ref 39.0–52.0)
Hemoglobin: 12.2 g/dL — ABNORMAL LOW (ref 13.0–17.0)
Immature Granulocytes: 2 %
Lymphocytes Relative: 5 %
Lymphs Abs: 1.3 K/uL (ref 0.7–4.0)
MCH: 32.4 pg (ref 26.0–34.0)
MCHC: 32.8 g/dL (ref 30.0–36.0)
MCV: 98.7 fL (ref 80.0–100.0)
Monocytes Absolute: 2.9 K/uL — ABNORMAL HIGH (ref 0.1–1.0)
Monocytes Relative: 10 %
Neutro Abs: 22.5 K/uL — ABNORMAL HIGH (ref 1.7–7.7)
Neutrophils Relative %: 81 %
Platelets: 296 K/uL (ref 150–400)
RBC: 3.77 MIL/uL — ABNORMAL LOW (ref 4.22–5.81)
RDW: 13.4 % (ref 11.5–15.5)
WBC: 28 K/uL — ABNORMAL HIGH (ref 4.0–10.5)
nRBC: 0 % (ref 0.0–0.2)

## 2023-10-12 LAB — MAGNESIUM: Magnesium: 2 mg/dL (ref 1.7–2.4)

## 2023-10-12 MED ORDER — POTASSIUM CHLORIDE CRYS ER 20 MEQ PO TBCR
40.0000 meq | EXTENDED_RELEASE_TABLET | Freq: Every day | ORAL | Status: DC
  Administered 2023-10-12 – 2023-10-15 (×4): 40 meq via ORAL
  Filled 2023-10-12 (×4): qty 2

## 2023-10-12 MED ORDER — FUROSEMIDE 40 MG PO TABS
40.0000 mg | ORAL_TABLET | Freq: Every day | ORAL | Status: DC
  Administered 2023-10-12 – 2023-10-15 (×4): 40 mg via ORAL
  Filled 2023-10-12 (×4): qty 1

## 2023-10-12 MED ORDER — ENSURE PLUS HIGH PROTEIN PO LIQD
237.0000 mL | Freq: Two times a day (BID) | ORAL | Status: DC
  Administered 2023-10-12 – 2023-10-15 (×6): 237 mL via ORAL

## 2023-10-12 MED ORDER — IPRATROPIUM-ALBUTEROL 0.5-2.5 (3) MG/3ML IN SOLN
3.0000 mL | Freq: Three times a day (TID) | RESPIRATORY_TRACT | Status: DC
  Administered 2023-10-12 – 2023-10-13 (×2): 3 mL via RESPIRATORY_TRACT
  Filled 2023-10-12 (×2): qty 3

## 2023-10-12 NOTE — Progress Notes (Signed)
 PROGRESS NOTE    FALLOU HULBERT  FMW:[id number] DOB: [birth year] DOA: 10/09/2023 PCP: Renato Dorothey HERO, NP    Brief Narrative:  68 year old with history of COPD usually on room air, community dwelling, ongoing smoker with recently treated pneumonia about a month ago completed antibiotics presented back to the emergency room with persistent dyspnea with exertion, generalized weakness and over the last 2 days pleuritic pain on the left side of the chest worse with nonproductive cough. At the emergency room, blood pressure initially 67/33-responded to IV fluids.  Lactic acid was 2.3.  Responded to IV fluids.  WBC count 35.3.  Chest x-ray and CT scan consistent with bilateral pneumonia.  Admitted with broad-spectrum antibiotics.  Subjective: Patient seen and examined today.  Still has left-sided pleuritic pain but much better than yesterday.  On 2 L oxygen.  Afebrile overnight.  WBC count 28,000.  Cultures negative so far.  Assessment & Plan:   Severe sepsis secondary to bilateral, multifocal pneumonia.  Presented with leukocytosis, lactic acidosis and tachycardia.  CT scan with bilateral pneumonia.  No cavitation on the CT scan. Recent hospitalization.  Currently on vancomycin  and cefepime .  Cultures negative.  MRSA swab negative.  Will stop vancomycin  in 72 hours today.  Continue cefepime . Chest physiotherapy, incentive spirometry, deep breathing exercises, sputum induction, mucolytic's and bronchodilators. Sputum cultures, blood cultures, Legionella and streptococcal antigen.  So far. Supplemental oxygen to keep saturations more than 90%. RVP panel negative.  COVID, flu and RSV negative.  Bilateral leg swelling: Recently started.  Elevated BNP.  Previous normal echo.  Echocardiogram with normal ejection fraction.  Blood pressures are adequate.  Will start low-dose diuretics today.  TED hose.  Smoker: Counseled to quit.  Nicotine  patch.  Hypokalemia: Replaced and adequate.  Obesity  class II: Recommended weight loss.  DVT prophylaxis: Place TED hose Start: 10/12/23 1101 enoxaparin  (LOVENOX ) injection 40 mg Start: 10/10/23 2200   Code Status: Full code Family Communication: None at the bedside Disposition Plan: Status is: Inpatient Remains inpatient appropriate because: Significant symptoms, IV antibiotics Patient has  stabilized, lactic acid has normalized.  Patient can transfer to MedSurg bed.   Consultants:  None  Procedures:  None  Antimicrobials:  Vancomycin  and cefepime  9/12---     Objective: Vitals:   10/12/23 0305 10/12/23 0400 10/12/23 0625 10/12/23 0800  BP: (!) 110/53 (!) 98/53 (!) 152/87   Pulse: 90 85 (!) 102   Resp: (!) 25 17 (!) 22   Temp:  97.8 F (36.6 C)  98.4 F (36.9 C)  TempSrc:  Oral  Oral  SpO2: (!) 87% (!) 89% (!) 89%   Weight:      Height:        Intake/Output Summary (Last 24 hours) at 10/12/2023 1103 Last data filed at 10/12/2023 0200 Gross per 24 hour  Intake 656.17 ml  Output --  Net 656.17 ml   Filed Weights   10/10/23 0800 10/10/23 1335  Weight: 103.9 kg 99.2 kg    Examination:  General exam: Send interactive.  Comfortable today.   Respiratory system: Clear to auscultation. Respiratory effort normal.  Pleuritic pain on deep breathing. Cardiovascular system: S1 & S2 heard, RRR. No JVD, murmurs, rubs, gallops or clicks.  2+ bilateral pedal edema.. Gastrointestinal system: Abdomen is nondistended, soft and nontender. No organomegaly or masses felt. Normal bowel sounds heard.  Patient has large midline hernia without complication.  Without tenderness. Central nervous system: Alert and oriented. No focal neurological deficits. Extremities: Symmetric 5 x 5 power.  Skin: No rashes, lesions or ulcers Psychiatry: Judgement and insight appear normal. Mood & affect appropriate.     Data Reviewed: I have personally reviewed following labs and imaging studies  CBC: Recent Labs  Lab 10/09/23 1730 10/10/23 0544  10/11/23 0302 10/12/23 0304  WBC 35.8* 35.3* 32.7* 28.0*  NEUTROABS  --   --   --  22.5*  HGB 15.1 13.3 12.8* 12.2*  HCT 45.2 39.6 39.5 37.2*  MCV 98.0 98.5 100.3* 98.7  PLT 368 316 299 296   Basic Metabolic Panel: Recent Labs  Lab 10/09/23 1730 10/10/23 0615 10/11/23 0302 10/12/23 0642  NA 134* 133* 131* 133*  K 3.8 3.2* 3.5 3.9  CL 94* 96* 95* 95*  CO2 27 26 27 26   GLUCOSE 116* 155* 96 88  BUN 11 14 10 10   CREATININE 1.02 0.89 0.80 0.80  CALCIUM  10.1 9.8 9.4 9.8  MG  --   --   --  2.0   GFR: Estimated Creatinine Clearance: 100.9 mL/min (by C-G formula based on SCr of 0.8 mg/dL). Liver Function Tests: Recent Labs  Lab 10/10/23 0615 10/12/23 0642  AST 24 56*  ALT 65* 86*  ALKPHOS 91 148*  BILITOT 0.3 0.4  PROT 5.8* 6.3*  ALBUMIN 2.6* 2.5*   No results for input(s): LIPASE, AMYLASE in the last 168 hours. No results for input(s): AMMONIA in the last 168 hours. Coagulation Profile: Recent Labs  Lab 10/09/23 1818  INR 1.1   Cardiac Enzymes: No results for input(s): CKTOTAL, CKMB, CKMBINDEX, TROPONINI in the last 168 hours. BNP (last 3 results) Recent Labs    09/05/23 0126 10/09/23 1818  PROBNP 559.0* 650.0*   HbA1C: No results for input(s): HGBA1C in the last 72 hours. CBG: No results for input(s): GLUCAP in the last 168 hours. Lipid Profile: No results for input(s): CHOL, HDL, LDLCALC, TRIG, CHOLHDL, LDLDIRECT in the last 72 hours. Thyroid Function Tests: No results for input(s): TSH, T4TOTAL, FREET4, T3FREE, THYROIDAB in the last 72 hours. Anemia Panel: No results for input(s): VITAMINB12, FOLATE, FERRITIN, TIBC, IRON, RETICCTPCT in the last 72 hours. Sepsis Labs: Recent Labs  Lab 10/09/23 2216 10/10/23 0200 10/10/23 0544 10/11/23 0918 10/11/23 1228  PROCALCITON 0.27  --   --   --   --   LATICACIDVEN  --  2.3* 2.3* 1.9 1.8    Recent Results (from the past 240 hours)  Culture, blood  (Routine x 2)     Status: None (Preliminary result)   Collection Time: 10/09/23  5:55 PM   Specimen: BLOOD  Result Value Ref Range Status   Specimen Description   Final    BLOOD BLOOD LEFT FOREARM Performed at [facility], [address], [place], [place] [address]    Special Requests   Final    BOTTLES DRAWN AEROBIC AND ANAEROBIC Blood Culture adequate volume Performed at [facility], [address] [place], [place] [address]    Culture   Final    NO GROWTH 3 DAYS Performed at [facility], [address], [place], [place] [address]    Report Status PENDING  Incomplete  Culture, blood (Routine x 2)     Status: None (Preliminary result)   Collection Time: 10/09/23  8:10 PM   Specimen: BLOOD  Result Value Ref Range Status   Specimen Description   Final    BLOOD RIGHT ANTECUBITAL Performed at [facility], [address], [place], [place] [address]    Special Requests  Final    BOTTLES DRAWN AEROBIC AND ANAEROBIC Blood Culture adequate volume Performed at [facility], [address] [place], [place] [address]    Culture   Final    NO GROWTH 3 DAYS Performed at [facility], [address], [place], [place] [address]    Report Status PENDING  Incomplete  Respiratory (~20 pathogens) panel by PCR     Status: None   Collection Time: 10/09/23 10:16 PM   Specimen: Nasopharyngeal Swab; Respiratory  Result Value Ref Range Status   Adenovirus NOT DETECTED NOT DETECTED Final   Coronavirus 229E NOT DETECTED NOT DETECTED Final    Comment: (NOTE) The Coronavirus on the Respiratory Panel, DOES NOT test for the novel  Coronavirus (2019 nCoV)    Coronavirus HKU1 NOT DETECTED NOT DETECTED Final   Coronavirus NL63 NOT DETECTED NOT DETECTED Final   Coronavirus OC43 NOT DETECTED NOT DETECTED Final   Metapneumovirus NOT DETECTED NOT DETECTED Final   Rhinovirus / Enterovirus NOT DETECTED NOT  DETECTED Final   Influenza A NOT DETECTED NOT DETECTED Final   Influenza B NOT DETECTED NOT DETECTED Final   Parainfluenza Virus 1 NOT DETECTED NOT DETECTED Final   Parainfluenza Virus 2 NOT DETECTED NOT DETECTED Final   Parainfluenza Virus 3 NOT DETECTED NOT DETECTED Final   Parainfluenza Virus 4 NOT DETECTED NOT DETECTED Final   Respiratory Syncytial Virus NOT DETECTED NOT DETECTED Final   Bordetella pertussis NOT DETECTED NOT DETECTED Final   Bordetella Parapertussis NOT DETECTED NOT DETECTED Final   Chlamydophila pneumoniae NOT DETECTED NOT DETECTED Final   Mycoplasma pneumoniae NOT DETECTED NOT DETECTED Final    Comment: Performed at [facility], [address], [place], [place] [address]  Expectorated Sputum Assessment w Gram Stain, Rflx to Resp Cult     Status: None   Collection Time: 10/09/23 10:16 PM   Specimen: Sputum  Result Value Ref Range Status   Specimen Description   Final    SPU Performed at [facility], [address] [place], [place] [address]    Special Requests   Final    Immunocompromised Performed at [facility], [address], [place], [place] [address]    Sputum evaluation   Final    THIS SPECIMEN IS ACCEPTABLE FOR SPUTUM CULTURE Performed at [facility], [address], [place], [place] [address]    Report Status 10/10/2023 FINAL  Final  Culture, Respiratory w Gram Stain     Status: None (Preliminary result)   Collection Time: 10/09/23 10:16 PM   Specimen: Sputum  Result Value Ref Range Status   Specimen Description SPU  Final   Special Requests Immunocompromised Reflexed from [id number]  Final   Gram Stain   Final    ABUNDANT WBC PRESENT, PREDOMINANTLY PMN NO ORGANISMS SEEN Performed at [facility], [address], [place], [place] [address]    Culture PENDING  Incomplete   Report Status PENDING  Incomplete  MRSA Next Gen by PCR, Nasal     Status: None   Collection Time: 10/10/23  1:40  PM   Specimen: Nasal Mucosa; Nasal Swab  Result Value Ref Range Status   MRSA by PCR Next Gen NOT DETECTED NOT DETECTED Final    Comment: (NOTE) The GeneXpert MRSA Assay (FDA approved for NASAL specimens only), is one component of a comprehensive MRSA colonization surveillance program. It is not intended to diagnose MRSA infection nor to guide or monitor treatment for MRSA  infections. Test performance is not FDA approved in patients less than 66 years old. Performed at [facility], [address], [place], [place] [address]          Radiology Studies: ECHOCARDIOGRAM COMPLETE Result Date: 10/11/2023    ECHOCARDIOGRAM REPORT   Patient Name:   TERRIL CHESTNUT Date of Exam: 10/11/2023 Medical Rec #:  [id number]         Height:       68.0 in Accession #:    [id number]        Weight:       218.7 lb Date of Birth:  [birth year]        BSA:          2.123 m Patient Age:    67 years          BP:           101/48 mmHg Patient Gender: M                 HR:           105 bpm. Exam Location:  Inpatient Procedure: 2D Echo, Cardiac Doppler, Color Doppler and Intracardiac            Opacification Agent (Both Spectral and Color Flow Doppler were            utilized during procedure). Indications:    CHF-Acute Diastolic I50.31  History:        Patient has prior history of Echocardiogram examinations, most                 recent 01/13/2021. COPD.  Sonographer:    Jayson Gaskins Referring Phys: [id number] MIR M [facility]  Sonographer Comments: Technically difficult study due to poor echo windows. IMPRESSIONS  1. Technically difficult study, very limited views. Left ventricular ejection fraction, by estimation, is 60 to 65%. The left ventricle has normal function. The left ventricle has no regional wall motion abnormalities.  2. Right ventricular systolic function is normal. The right ventricular size is normal.  3. The mitral valve was not well visualized. No evidence of mitral valve regurgitation.   4. The aortic valve was not well visualized. Aortic valve regurgitation is not visualized. No aortic stenosis is present. FINDINGS  Left Ventricle: Left ventricular ejection fraction, by estimation, is 60 to 65%. The left ventricle has normal function. The left ventricle has no regional wall motion abnormalities. Definity  contrast agent was given IV to delineate the left ventricular  endocardial borders. The left ventricular internal cavity size was normal in size. Suboptimal image quality limits for assessment of left ventricular hypertrophy. Left ventricular diastolic parameters are indeterminate. Right Ventricle: The right ventricular size is normal. No increase in right ventricular wall thickness. Right ventricular systolic function is normal. Left Atrium: Left atrial size was not well visualized. Right Atrium: Right atrial size was not well visualized. Pericardium: There is no evidence of pericardial effusion. Mitral Valve: The mitral valve was not well visualized. No evidence of mitral valve regurgitation. Tricuspid Valve: The tricuspid valve is not well visualized. Tricuspid valve regurgitation is trivial. Aortic Valve: The aortic valve was not well visualized. Aortic valve regurgitation is not visualized. No aortic stenosis is present. Aortic valve mean gradient measures 5.0 mmHg. Aortic valve peak gradient measures 8.0 mmHg. Aortic valve area, by VTI measures 3.12 cm. Pulmonic Valve: The pulmonic valve was not well visualized. Pulmonic valve regurgitation is not visualized. Aorta: The aortic root was not well visualized. IAS/Shunts: The  interatrial septum was not well visualized.  LEFT VENTRICLE PLAX 2D LVOT diam:     1.90 cm   Diastology LV SV:         65        LV e' medial:    5.44 cm/s LV SV Index:   31        LV E/e' medial:  11.1 LVOT Area:     2.84 cm  LV e' lateral:   7.51 cm/s                          LV E/e' lateral: 8.0  RIGHT VENTRICLE RV S prime:     16.80 cm/s TAPSE (M-mode): 2.1 cm LEFT  ATRIUM           Index LA Vol (A4C): 16.2 ml 7.63 ml/m  AORTIC VALVE AV Area (Vmax):    2.71 cm AV Area (Vmean):   2.59 cm AV Area (VTI):     3.12 cm AV Vmax:           141.00 cm/s AV Vmean:          105.000 cm/s AV VTI:            0.210 m AV Peak Grad:      8.0 mmHg AV Mean Grad:      5.0 mmHg LVOT Vmax:         135.00 cm/s LVOT Vmean:        95.800 cm/s LVOT VTI:          0.231 m LVOT/AV VTI ratio: 1.10 MITRAL VALVE MV Area (PHT): 3.02 cm    SHUNTS MV Decel Time: 251 msec    Systemic VTI:  0.23 m MV E velocity: 60.40 cm/s  Systemic Diam: 1.90 cm MV A velocity: 87.40 cm/s MV E/A ratio:  0.69 Lonni Nanas MD Electronically signed by Lonni Nanas MD Signature Date/Time: 10/11/2023/12:42:32 PM    Final         Scheduled Meds:  Chlorhexidine  Gluconate Cloth  6 each Topical Daily   enoxaparin  (LOVENOX ) injection  40 mg Subcutaneous Q24H   feeding supplement  237 mL Oral BID BM   furosemide   40 mg Oral Daily   guaiFENesin   1,200 mg Oral BID   nicotine   21 mg Transdermal Daily   potassium chloride   40 mEq Oral Daily   tamsulosin   0.8 mg Oral QPC breakfast   Continuous Infusions:  ceFEPime  (MAXIPIME ) IV Stopped (10/12/23 1000)   vancomycin  1,000 mg (10/12/23 1037)     LOS: 2 days    Time spent: 55 minutes    Renato Applebaum, MD [facility]

## 2023-10-12 NOTE — Plan of Care (Signed)
  Problem: Education: Goal: Knowledge of General Education information will improve Description: Including pain rating scale, medication(s)/side effects and non-pharmacologic comfort measures Outcome: Progressing   Problem: Health Behavior/Discharge Planning: Goal: Ability to manage health-related needs will improve Outcome: Progressing   Problem: Clinical Measurements: Goal: Ability to maintain clinical measurements within normal limits will improve Outcome: Progressing Goal: Will remain free from infection Outcome: Progressing Goal: Diagnostic test results will improve Outcome: Progressing Goal: Respiratory complications will improve Outcome: Progressing Goal: Cardiovascular complication will be avoided Outcome: Progressing   Problem: Activity: Goal: Risk for activity intolerance will decrease Outcome: Progressing   Problem: Nutrition: Goal: Adequate nutrition will be maintained Outcome: Progressing   Problem: Coping: Goal: Level of anxiety will decrease Outcome: Progressing   Problem: Elimination: Goal: Will not experience complications related to bowel motility Outcome: Progressing Goal: Will not experience complications related to urinary retention Outcome: Progressing   Problem: Pain Managment: Goal: General experience of comfort will improve and/or be controlled Outcome: Progressing   Problem: Safety: Goal: Ability to remain free from injury will improve Outcome: Progressing   Problem: Skin Integrity: Goal: Risk for impaired skin integrity will decrease Outcome: Progressing   [name] BSN, RN, CCRP, CCRN 10/12/2023 2:43 AM

## 2023-10-13 DIAGNOSIS — A419 Sepsis, unspecified organism: Secondary | ICD-10-CM | POA: Diagnosis not present

## 2023-10-13 DIAGNOSIS — J189 Pneumonia, unspecified organism: Secondary | ICD-10-CM | POA: Diagnosis not present

## 2023-10-13 LAB — CBC WITH DIFFERENTIAL/PLATELET
Abs Immature Granulocytes: 0.85 K/uL — ABNORMAL HIGH (ref 0.00–0.07)
Basophils Absolute: 0.1 K/uL (ref 0.0–0.1)
Basophils Relative: 0 %
Eosinophils Absolute: 0.6 K/uL — ABNORMAL HIGH (ref 0.0–0.5)
Eosinophils Relative: 2 %
HCT: 41.8 % (ref 39.0–52.0)
Hemoglobin: 13 g/dL (ref 13.0–17.0)
Immature Granulocytes: 3 %
Lymphocytes Relative: 5 %
Lymphs Abs: 1.2 K/uL (ref 0.7–4.0)
MCH: 31.5 pg (ref 26.0–34.0)
MCHC: 31.1 g/dL (ref 30.0–36.0)
MCV: 101.2 fL — ABNORMAL HIGH (ref 80.0–100.0)
Monocytes Absolute: 2.8 K/uL — ABNORMAL HIGH (ref 0.1–1.0)
Monocytes Relative: 10 %
Neutro Abs: 21.2 K/uL — ABNORMAL HIGH (ref 1.7–7.7)
Neutrophils Relative %: 80 %
Platelets: 334 K/uL (ref 150–400)
RBC: 4.13 MIL/uL — ABNORMAL LOW (ref 4.22–5.81)
RDW: 13.5 % (ref 11.5–15.5)
WBC: 26.8 K/uL — ABNORMAL HIGH (ref 4.0–10.5)
nRBC: 0 % (ref 0.0–0.2)

## 2023-10-13 MED ORDER — IPRATROPIUM-ALBUTEROL 0.5-2.5 (3) MG/3ML IN SOLN
3.0000 mL | Freq: Two times a day (BID) | RESPIRATORY_TRACT | Status: DC
  Administered 2023-10-13 – 2023-10-15 (×4): 3 mL via RESPIRATORY_TRACT
  Filled 2023-10-13 (×4): qty 3

## 2023-10-13 NOTE — TOC Initial Note (Addendum)
 Transition of Care [facility]) - Initial/Assessment Note    Patient Details  Name: [name] MRN: [id number] Date of Birth: Sep 02, 1955  [facility]) CM/SW Contact:    Sheri ONEIDA Sharps, LCSW Phone Number: 10/13/2023, 10:30 AM  Clinical Narrative:                 Pt from home w/ adult dtr. Pt currently on 2L O2. Pt continues medical workup. Following for dc needs.  Pt recommended for HHPT/OT. HH setup w/ [facility].    Barriers to Discharge: Continued Medical Work up   Patient Goals and CMS Choice Patient states their goals for this hospitalization and ongoing recovery are:: return home   Choice offered to / list presented to : NA      Expected Discharge Plan and Services   Discharge Planning Services: NA   Living arrangements for the past 2 months: Single Family Home                 DME Arranged: N/A DME Agency: NA       HH Arranged: NA HH Agency: NA        Prior Living Arrangements/Services Living arrangements for the past 2 months: Single Family Home Lives with:: Adult Children Patient language and need for interpreter reviewed:: Yes Do you feel safe going back to the place where you live?: Yes      Need for Family Participation in Patient Care: Yes (Comment) Care giver support system in place?: Yes (comment)   Criminal Activity/Legal Involvement Pertinent to Current Situation/Hospitalization: No - Comment as needed  Activities of Daily Living   ADL Screening (condition at time of admission) Independently performs ADLs?: Yes (appropriate for developmental age) Is the patient deaf or have difficulty hearing?: No Does the patient have difficulty seeing, even when wearing glasses/contacts?: No Does the patient have difficulty concentrating, remembering, or making decisions?: No  Permission Sought/Granted                  Emotional Assessment Appearance:: Appears stated age Attitude/Demeanor/Rapport: Engaged Affect (typically  observed): Accepting Orientation: : Oriented to Situation, Oriented to Self, Oriented to Place, Oriented to  Time Alcohol / Substance Use: Not Applicable Psych Involvement: No (comment)  Admission diagnosis:  Sepsis (HCC) [A41.9] Sepsis due to pneumonia (HCC) [J18.9, A41.9] Multifocal pneumonia [J18.9] Patient Active Problem List   Diagnosis Date Noted   Sepsis due to pneumonia (HCC) 10/10/2023   Sepsis (HCC) 10/09/2023   Acute exacerbation of CHF (congestive heart failure) (HCC) 10/09/2023   Multifocal pneumonia 09/05/2023   COPD with acute exacerbation (HCC) 01/15/2021   Obesity, Class III, BMI 40-49.9 (morbid obesity) 01/15/2021   Acute respiratory failure with hypoxia (HCC) 01/12/2021   COPD (chronic obstructive pulmonary disease) (HCC) 01/12/2021   GERD (gastroesophageal reflux disease) 01/12/2021   Chronic pain 01/12/2021   Dyspnea 03/20/2017   Ventral hernia 03/20/2017   Cancer (HCC) 2005   PCP:  Renato Dorothey HERO, NP Pharmacy:   [facility] [phone] - [place], [place] - [address] [place] [address] Phone: [phone] Fax: [phone]     Social Drivers of Health (SDOH) Social History: SDOH Screenings   Food Insecurity: No Food Insecurity (10/10/2023)  Housing: Low Risk  (10/10/2023)  Transportation Needs: No Transportation Needs (10/10/2023)  Utilities: Not At Risk (10/10/2023)  Social Connections: Socially Isolated (10/10/2023)  Tobacco Use: High Risk (10/09/2023)   SDOH Interventions:     Readmission Risk Interventions    10/13/2023  10:27 AM  Readmission Risk Prevention Plan  Transportation Screening Complete  PCP or Specialist Appt within 5-7 Days Complete  Home Care Screening Complete  Medication Review (RN CM) Complete

## 2023-10-13 NOTE — Progress Notes (Signed)
 SATURATION QUALIFICATIONS: (This note is used to comply with regulatory documentation for home oxygen)  Patient Saturations on Room Air at Rest = 90-93%  Patient Saturations on Room Air while Ambulating = 86%   [place], PT Acute Rehab

## 2023-10-13 NOTE — Evaluation (Signed)
 Physical Therapy Evaluation Patient Details Name: [name] MRN: [id number] DOB: [birth year] Today's Date: 10/13/2023  History of Present Illness  Pt is a 68 y/o male presenting to therapy following hospitalization admission on 10/09/2023 due to severe sepsis secondary to PNA, hypotension and B LE pitting edema. Pt having DOE, weakness and pleuritic L side chest pain with non-productive cough. Pt had completed course of ABX from prior hospitalization about one month ago. Pt PMH includes but is not limited to: COPD, anemia, colon cancer s/p sx and chemo, neuropathy, Parsonage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, CKD, and tobacco use.  Clinical Impression    Pt admitted with above diagnosis.  Pt currently with functional limitations due to the deficits listed below (see PT Problem List). Pt in bed when PT arrived. Pt is mod I for bed mobility tasks with use of hospital bed, S for sit to stand  from EOB no UE support/AD, gait tasks in hallway with IV pole and CGA progressing to S and min cues for posture, safety and coordinated breathing 80 feet. Pt desaturated to 86% on RA with gait tasks requiring 1:57 to recover once seated in recliner with cues for coordination breathing strategies. Pt reported it was nearly 3 x longer for his recovery today vs typical. Pt reported feeling like he was struggling to breath but not necessarily SOB. Pt encouraged to sit up, use incentive spirometer and perform ankle pumps with B LE elevated. Pt verbalized understanding. Pt left in recliner and all needs in place. Pt will benefit from acute skilled PT to increase their independence and safety with mobility to allow discharge.   Please see walking O2 note.       If plan is discharge home, recommend the following: A little help with walking and/or transfers;A little help with bathing/dressing/bathroom;Assistance with cooking/housework;Assist for transportation;Help with stairs or ramp for entrance   Can travel by private vehicle         Equipment Recommendations None recommended by PT  Recommendations for Other Services       Functional Status Assessment Patient has had a recent decline in their functional status and demonstrates the ability to make significant improvements in function in a reasonable and predictable amount of time.     Precautions / Restrictions Precautions Precautions: Fall (monitor O2) Restrictions Weight Bearing Restrictions Per Provider Order: No      Mobility  Bed Mobility Overal bed mobility: Modified Independent             General bed mobility comments: HOB elevated, pt in long sit when PT arrived    Transfers Overall transfer level: Needs assistance Equipment used: None Transfers: Sit to/from Stand Sit to Stand: Supervision           General transfer comment: min cues    Ambulation/Gait Ambulation/Gait assistance: Supervision, Contact guard assist Gait Distance (Feet): 80 Feet Assistive device: IV Pole Gait Pattern/deviations: Decreased step length - right, Decreased step length - left, Wide base of support Gait velocity: decreased     General Gait Details: decreased stride length with step almost through pattern, min cues for posture and coordinated breathing  Stairs            Wheelchair Mobility     Tilt Bed    Modified Rankin (Stroke Patients Only)       Balance Overall balance assessment: History of Falls, Mild deficits observed, not formally tested  Pertinent Vitals/Pain Pain Assessment Pain Assessment: 0-10 Pain Score: 4  Pain Location: neck--chronic and L side pleural pain increasing with coughing Pain Descriptors / Indicators: Aching, Discomfort, Dull, Grimacing Pain Intervention(s): Limited activity within patient's tolerance, Monitored during session, Repositioned    Home Living Family/patient expects to be discharged to:: Private residence Living Arrangements:  Children Available Help at Discharge: Family;Available PRN/intermittently Type of Home: House Home Access: Stairs to enter Entrance Stairs-Rails:  (pole near door) Entrance Stairs-Number of Steps: 3 steep steps   Home Layout: One level Home Equipment: Educational psychologist (4 wheels) Additional Comments: daughter works; pt reports they only see each other around 1 hour per day    Prior Function Prior Level of Function : Independent/Modified Independent;History of Falls (last six months)             Mobility Comments: use of rollator for home and community navigation ADLs Comments: Indep with ADLs, IADLs. reports some recent issues stepping over tub     Extremity/Trunk Assessment        Lower Extremity Assessment Lower Extremity Assessment: Overall WFL for tasks assessed (B LE pitting edema and compression stockings donned at eval)    Cervical / Trunk Assessment Cervical / Trunk Assessment: Normal (pt reports hx of chronic neck pain)  Communication   Communication Communication: No apparent difficulties    Cognition Arousal: Alert Behavior During Therapy: WFL for tasks assessed/performed   PT - Cognitive impairments: No apparent impairments                         Following commands: Intact       Cueing       General Comments      Exercises General Exercises - Lower Extremity Ankle Circles/Pumps: AROM, Both, 10 reps (pt encouraged to perform ankle pumps to help address B LE edema)   Assessment/Plan    PT Assessment Patient needs continued PT services  PT Problem List Decreased activity tolerance;Decreased balance;Decreased mobility;Cardiopulmonary status limiting activity;Pain       PT Treatment Interventions DME instruction;Gait training;Stair training;Functional mobility training;Therapeutic activities;Therapeutic exercise;Balance training;Neuromuscular re-education;Patient/family education    PT Goals (Current goals can be found in the Care  Plan section)  Acute Rehab PT Goals Patient Stated Goal: to have the PNA out of my system and get home PT Goal Formulation: With patient Time For Goal Achievement: 10/27/23 Potential to Achieve Goals: Good    Frequency Min 3X/week     Co-evaluation               AM-PAC PT 6 Clicks Mobility  Outcome Measure Help needed turning from your back to your side while in a flat bed without using bedrails?: None Help needed moving from lying on your back to sitting on the side of a flat bed without using bedrails?: None Help needed moving to and from a bed to a chair (including a wheelchair)?: A Little Help needed standing up from a chair using your arms (e.g., wheelchair or bedside chair)?: A Little Help needed to walk in hospital room?: A Little Help needed climbing 3-5 steps with a railing? : A Lot 6 Click Score: 19    End of Session Equipment Utilized During Treatment: Gait belt Activity Tolerance: Patient tolerated treatment well;No increased pain Patient left: in chair;with call bell/phone within reach Nurse Communication: Mobility status;Other (comment) (O2 findings) PT Visit Diagnosis: Unsteadiness on feet (R26.81);History of falling (Z91.81);Pain;Difficulty in walking, not elsewhere classified (R26.2) Pain - Right/Left:  (  neck and chest)    Time: 8963-8946 PT Time Calculation (min) (ACUTE ONLY): 17 min   Charges:   PT Evaluation $PT Eval Low Complexity: 1 Low   PT General Charges $$ ACUTE PT VISIT: 1 Visit         [place], PT Acute Rehab   [place] VEAR Drone 10/13/2023, 11:27 AM

## 2023-10-13 NOTE — Progress Notes (Signed)
 PROGRESS NOTE    [name]  FMW:[id number] DOB: 12/15/1955 DOA: 10/09/2023 PCP: Renato Dorothey HERO, NP    Brief Narrative:  68 year old with history of COPD usually on room air, community dwelling, ongoing smoker with recently treated pneumonia about a month ago completed antibiotics presented back to the emergency room with persistent dyspnea with exertion, generalized weakness and over the last 2 days pleuritic pain on the left side of the chest worse with nonproductive cough. At the emergency room, blood pressure initially 67/33-responded to IV fluids.  Lactic acid was 2.3.  Responded to IV fluids.  WBC count 35.3.  Chest x-ray and CT scan consistent with bilateral pneumonia.  Admitted with broad-spectrum antibiotics.  Subjective: Patient seen and examined.  Now with copious cough and mucoid sputum.  Afebrile.  WBC count still elevated but improved than before.  On 2 L oxygen. Blood cultures negative.  Sputum culture with rare Pseudomonas.  Assessment & Plan:   Severe sepsis secondary to bilateral, multifocal pneumonia.  Presented with leukocytosis, lactic acidosis and tachycardia.  CT scan with bilateral pneumonia.  No cavitation on the CT scan. Recent hospitalization.   Treated with vancomycin  and cefepime .   Vancomycin  72 hours -negative cultures -MRSA swab negative.  Discontinued.   Continue cefepime .  Blood cultures negative.  Sputum cultures with rare Pseudomonas likely colonized.  Will continue cefepime  until clinical improvement.   Chest physiotherapy, incentive spirometry, deep breathing exercises, sputum induction, mucolytic's and bronchodilators. Supplemental oxygen to keep saturations more than 90%. RVP panel negative.  COVID, flu and RSV negative. Mobilize in the hallway.  Aggressive chest physio.  Bilateral leg swelling: Recently started.  Elevated BNP.  Previous normal echo.  Echocardiogram with normal ejection fraction.  Blood pressures are adequate.  Will  start low-dose diuretics today.  TED hose.  Smoker: Counseled to quit.  Nicotine  patch.  Hypokalemia: Replaced and adequate.  Obesity class II: Recommended weight loss.  DVT prophylaxis: Place TED hose Start: 10/12/23 1101 enoxaparin  (LOVENOX ) injection 40 mg Start: 10/10/23 2200   Code Status: Full code Family Communication: None at the bedside Disposition Plan: Status is: Inpatient Remains inpatient appropriate because: Significant symptoms, IV antibiotics   Consultants:  None  Procedures:  None  Antimicrobials:  Vancomycin  9/12--- 9/14 Cefepime  9/12--     Objective: Vitals:   10/12/23 2010 10/12/23 2011 10/13/23 0622 10/13/23 0742  BP: 121/67  (!) 108/58   Pulse: 95  86   Resp: 20  18   Temp: 98.8 F (37.1 C)  (!) 97.4 F (36.3 C)   TempSrc: Oral  Oral   SpO2: (!) 89% 90% 96% 92%  Weight:      Height:        Intake/Output Summary (Last 24 hours) at 10/13/2023 1106 Last data filed at 10/13/2023 0600 Gross per 24 hour  Intake 780 ml  Output 400 ml  Net 380 ml   Filed Weights   10/10/23 0800 10/10/23 1335  Weight: 103.9 kg 99.2 kg    Examination:  General exam: Pleasant.  Mildly anxious with unrelenting cough. Respiratory system: Conducted upper airway sounds.  Poor inspiratory effort.  On 2 L oxygen.   Cardiovascular system: S1 & S2 heard, RRR. No JVD, murmurs, rubs, gallops or clicks.  1+ bilateral pedal edema. Wearing TED hoses.  Gastrointestinal system: Abdomen is nondistended, soft and nontender. No organomegaly or masses felt. Normal bowel sounds heard.  Patient has large midline hernia without complication.  Without tenderness. Central nervous system: Alert and oriented. No  focal neurological deficits. Extremities: Symmetric 5 x 5 power. Skin: No rashes, lesions or ulcers Psychiatry: Judgement and insight appear normal. Mood & affect appropriate.     Data Reviewed: I have personally reviewed following labs and imaging studies  CBC: Recent  Labs  Lab 10/09/23 1730 10/10/23 0544 10/11/23 0302 10/12/23 0304 10/13/23 0445  WBC 35.8* 35.3* 32.7* 28.0* 26.8*  NEUTROABS  --   --   --  22.5* 21.2*  HGB 15.1 13.3 12.8* 12.2* 13.0  HCT 45.2 39.6 39.5 37.2* 41.8  MCV 98.0 98.5 100.3* 98.7 101.2*  PLT 368 316 299 296 334   Basic Metabolic Panel: Recent Labs  Lab 10/09/23 1730 10/10/23 0615 10/11/23 0302 10/12/23 0642  NA 134* 133* 131* 133*  K 3.8 3.2* 3.5 3.9  CL 94* 96* 95* 95*  CO2 27 26 27 26   GLUCOSE 116* 155* 96 88  BUN 11 14 10 10   CREATININE 1.02 0.89 0.80 0.80  CALCIUM  10.1 9.8 9.4 9.8  MG  --   --   --  2.0   GFR: Estimated Creatinine Clearance: 100.9 mL/min (by C-G formula based on SCr of 0.8 mg/dL). Liver Function Tests: Recent Labs  Lab 10/10/23 0615 10/12/23 0642  AST 24 56*  ALT 65* 86*  ALKPHOS 91 148*  BILITOT 0.3 0.4  PROT 5.8* 6.3*  ALBUMIN 2.6* 2.5*   No results for input(s): LIPASE, AMYLASE in the last 168 hours. No results for input(s): AMMONIA in the last 168 hours. Coagulation Profile: Recent Labs  Lab 10/09/23 1818  INR 1.1   Cardiac Enzymes: No results for input(s): CKTOTAL, CKMB, CKMBINDEX, TROPONINI in the last 168 hours. BNP (last 3 results) Recent Labs    09/05/23 0126 10/09/23 1818  PROBNP 559.0* 650.0*   HbA1C: No results for input(s): HGBA1C in the last 72 hours. CBG: No results for input(s): GLUCAP in the last 168 hours. Lipid Profile: No results for input(s): CHOL, HDL, LDLCALC, TRIG, CHOLHDL, LDLDIRECT in the last 72 hours. Thyroid Function Tests: No results for input(s): TSH, T4TOTAL, FREET4, T3FREE, THYROIDAB in the last 72 hours. Anemia Panel: No results for input(s): VITAMINB12, FOLATE, FERRITIN, TIBC, IRON, RETICCTPCT in the last 72 hours. Sepsis Labs: Recent Labs  Lab 10/09/23 2216 10/10/23 0200 10/10/23 0544 10/11/23 0918 10/11/23 1228  PROCALCITON 0.27  --   --   --   --   LATICACIDVEN  --   2.3* 2.3* 1.9 1.8    Recent Results (from the past 240 hours)  Culture, blood (Routine x 2)     Status: None (Preliminary result)   Collection Time: 10/09/23  5:55 PM   Specimen: BLOOD  Result Value Ref Range Status   Specimen Description   Final    BLOOD BLOOD LEFT FOREARM Performed at [facility], [address] [place], [place] [address]    Special Requests   Final    BOTTLES DRAWN AEROBIC AND ANAEROBIC Blood Culture adequate volume Performed at [facility], [address], [place], [place] [address]    Culture   Final    NO GROWTH 4 DAYS Performed at [facility], [address] [place], [place] [address]    Report Status PENDING  Incomplete  Culture, blood (Routine x 2)     Status: None (Preliminary result)   Collection Time: 10/09/23  8:10 PM   Specimen: BLOOD  Result Value Ref Range Status   Specimen Description   Final    BLOOD RIGHT ANTECUBITAL Performed at  [facility], [address], [place], [place] [address]    Special Requests   Final    BOTTLES DRAWN AEROBIC AND ANAEROBIC Blood Culture adequate volume Performed at [facility], [address], [place], [place] [address]    Culture   Final    NO GROWTH 4 DAYS Performed at [facility], [address] [place], [place] [address]    Report Status PENDING  Incomplete  Respiratory (~20 pathogens) panel by PCR     Status: None   Collection Time: 10/09/23 10:16 PM   Specimen: Nasopharyngeal Swab; Respiratory  Result Value Ref Range Status   Adenovirus NOT DETECTED NOT DETECTED Final   Coronavirus 229E NOT DETECTED NOT DETECTED Final    Comment: (NOTE) The Coronavirus on the Respiratory Panel, DOES NOT test for the novel  Coronavirus (2019 nCoV)    Coronavirus HKU1 NOT DETECTED NOT DETECTED Final   Coronavirus NL63 NOT DETECTED NOT DETECTED Final   Coronavirus OC43 NOT DETECTED NOT DETECTED Final    Metapneumovirus NOT DETECTED NOT DETECTED Final   Rhinovirus / Enterovirus NOT DETECTED NOT DETECTED Final   Influenza A NOT DETECTED NOT DETECTED Final   Influenza B NOT DETECTED NOT DETECTED Final   Parainfluenza Virus 1 NOT DETECTED NOT DETECTED Final   Parainfluenza Virus 2 NOT DETECTED NOT DETECTED Final   Parainfluenza Virus 3 NOT DETECTED NOT DETECTED Final   Parainfluenza Virus 4 NOT DETECTED NOT DETECTED Final   Respiratory Syncytial Virus NOT DETECTED NOT DETECTED Final   Bordetella pertussis NOT DETECTED NOT DETECTED Final   Bordetella Parapertussis NOT DETECTED NOT DETECTED Final   Chlamydophila pneumoniae NOT DETECTED NOT DETECTED Final   Mycoplasma pneumoniae NOT DETECTED NOT DETECTED Final    Comment: Performed at [facility], [address], [place], [place] [address]  Expectorated Sputum Assessment w Gram Stain, Rflx to Resp Cult     Status: None   Collection Time: 10/09/23 10:16 PM   Specimen: Sputum  Result Value Ref Range Status   Specimen Description   Final    SPU Performed at [facility], [address] [place], [place] [address]    Special Requests   Final    Immunocompromised Performed at [facility], [address] [place], [place] [address]    Sputum evaluation   Final    THIS SPECIMEN IS ACCEPTABLE FOR SPUTUM CULTURE Performed at [facility], [address] [place], [place] [address]    Report Status 10/10/2023 FINAL  Final  Culture, Respiratory w Gram Stain     Status: None (Preliminary result)   Collection Time: 10/09/23 10:16 PM   Specimen: Sputum  Result Value Ref Range Status   Specimen Description SPU  Final   Special Requests Immunocompromised Reflexed from [id number]  Final   Gram Stain   Final    ABUNDANT WBC PRESENT, PREDOMINANTLY PMN NO ORGANISMS SEEN    Culture   Final    RARE PSEUDOMONAS AERUGINOSA CULTURE REINCUBATED FOR BETTER GROWTH Performed at [facility], [address] [place], [place] [address]    Report Status PENDING  Incomplete  MRSA Next Gen by PCR, Nasal     Status: None   Collection Time: 10/10/23  1:40 PM   Specimen: Nasal Mucosa; Nasal Swab  Result Value Ref Range Status   MRSA by PCR Next Gen NOT DETECTED NOT DETECTED Final    Comment: (NOTE) The GeneXpert MRSA Assay (FDA approved for  NASAL specimens only), is one component of a comprehensive MRSA colonization surveillance program. It is not intended to diagnose MRSA infection nor to guide or monitor treatment for MRSA infections. Test performance is not FDA approved in patients less than 42 years old. Performed at [facility], [address], [place], [place] [address]          Radiology Studies: No results found.       Scheduled Meds:  enoxaparin  (LOVENOX ) injection  40 mg Subcutaneous Q24H   feeding supplement  237 mL Oral BID BM   furosemide   40 mg Oral Daily   guaiFENesin   1,200 mg Oral BID   ipratropium-albuterol   3 mL Nebulization BID   nicotine   21 mg Transdermal Daily   potassium chloride   40 mEq Oral Daily   tamsulosin   0.8 mg Oral QPC breakfast   Continuous Infusions:  ceFEPime  (MAXIPIME ) IV 2 g (10/13/23 0824)     LOS: 3 days    Time spent: 55 minutes    Renato Applebaum, MD [facility]

## 2023-10-14 DIAGNOSIS — A419 Sepsis, unspecified organism: Secondary | ICD-10-CM | POA: Diagnosis not present

## 2023-10-14 DIAGNOSIS — J189 Pneumonia, unspecified organism: Secondary | ICD-10-CM | POA: Diagnosis not present

## 2023-10-14 LAB — CBC WITH DIFFERENTIAL/PLATELET
Abs Immature Granulocytes: 0.6 K/uL — ABNORMAL HIGH (ref 0.00–0.07)
Basophils Absolute: 0.1 K/uL (ref 0.0–0.1)
Basophils Relative: 0 %
Eosinophils Absolute: 0.4 K/uL (ref 0.0–0.5)
Eosinophils Relative: 2 %
HCT: 39.3 % (ref 39.0–52.0)
Hemoglobin: 12.3 g/dL — ABNORMAL LOW (ref 13.0–17.0)
Immature Granulocytes: 3 %
Lymphocytes Relative: 7 %
Lymphs Abs: 1.4 K/uL (ref 0.7–4.0)
MCH: 31.3 pg (ref 26.0–34.0)
MCHC: 31.3 g/dL (ref 30.0–36.0)
MCV: 100 fL (ref 80.0–100.0)
Monocytes Absolute: 2.1 K/uL — ABNORMAL HIGH (ref 0.1–1.0)
Monocytes Relative: 10 %
Neutro Abs: 16.3 K/uL — ABNORMAL HIGH (ref 1.7–7.7)
Neutrophils Relative %: 78 %
Platelets: 301 K/uL (ref 150–400)
RBC: 3.93 MIL/uL — ABNORMAL LOW (ref 4.22–5.81)
RDW: 13.4 % (ref 11.5–15.5)
WBC: 20.9 K/uL — ABNORMAL HIGH (ref 4.0–10.5)
nRBC: 0 % (ref 0.0–0.2)

## 2023-10-14 LAB — CULTURE, RESPIRATORY W GRAM STAIN

## 2023-10-14 LAB — CULTURE, BLOOD (ROUTINE X 2)
Culture: NO GROWTH
Culture: NO GROWTH
Special Requests: ADEQUATE
Special Requests: ADEQUATE

## 2023-10-14 MED ORDER — ALUM & MAG HYDROXIDE-SIMETH 200-200-20 MG/5ML PO SUSP
30.0000 mL | Freq: Four times a day (QID) | ORAL | Status: DC | PRN
  Administered 2023-10-14 (×2): 30 mL via ORAL
  Filled 2023-10-14 (×2): qty 30

## 2023-10-14 NOTE — Progress Notes (Signed)
 PROGRESS NOTE    [name]  FMW:[id number] DOB: 1955-05-13 DOA: 10/09/2023 PCP: Renato Dorothey HERO, NP    Brief Narrative:  68 year old with history of COPD usually on room air, community dwelling, ongoing smoker with recently treated pneumonia about a month ago completed antibiotics presented back to the emergency room with persistent dyspnea with exertion, generalized weakness and over the last 2 days pleuritic pain on the left side of the chest worse with nonproductive cough. At the emergency room, blood pressure initially 67/33-responded to IV fluids.  Lactic acid was 2.3.  Responded to IV fluids.  WBC count 35.3.  Chest x-ray and CT scan consistent with bilateral pneumonia.  Admitted with broad-spectrum antibiotics.  Subjective: Patient seen and examined.  Afebrile.  Grooming up.  On room air at rest.  Gets out of breath on mobility.  Apparently, he does have oxygen at home.  WBC count 20,000. Dry cough present.  Assessment & Plan:   Severe sepsis secondary to bilateral, multifocal pneumonia.  Presented with leukocytosis, lactic acidosis and tachycardia.  CT scan with bilateral pneumonia.  No cavitation on the CT scan. Recent hospitalization.   Treated with vancomycin  and cefepime .   Vancomycin  72 hours -negative cultures -MRSA swab negative.  Discontinued.   Continue cefepime .  Blood cultures negative.  Sputum cultures with rare Pseudomonas likely colonized.  Will continue cefepime  for 5 days, may complete therapy by tomorrow morning.  Given severity of symptoms, will treat with 10 days of antibiotics.  Additional 5 days of oral antibiotics starting tomorrow. Chest physiotherapy, incentive spirometry, deep breathing exercises, sputum induction, mucolytic's and bronchodilators. Supplemental oxygen to keep saturations more than 90%. RVP panel negative.  COVID, flu and RSV negative. Mobilize in the hallway.  Aggressive chest physio.  Bilateral leg swelling: Recently started.   Elevated BNP.  Previous normal echo.  Echocardiogram with normal ejection fraction.  Blood pressures are adequate.  Will start low-dose diuretics today.  TED hose.  Smoker: Counseled to quit.  Nicotine  patch.  Hypokalemia: Replaced and adequate.  Recheck tomorrow morning.  Obesity class II: Recommended weight loss.  DVT prophylaxis: Place TED hose Start: 10/12/23 1101 enoxaparin  (LOVENOX ) injection 40 mg Start: 10/10/23 2200   Code Status: Full code Family Communication: None at the bedside Disposition Plan: Status is: Inpatient Remains inpatient appropriate because: Significant symptoms, IV antibiotics   Consultants:  None  Procedures:  None  Antimicrobials:  Vancomycin  9/12--- 9/14 Cefepime  9/12--     Objective: Vitals:   10/13/23 1229 10/13/23 2047 10/14/23 0430 10/14/23 0808  BP: 106/62 101/72 (!) 103/56   Pulse: 97 89 83   Resp: 17 19 18    Temp: 98.5 F (36.9 C) 97.8 F (36.6 C) 97.7 F (36.5 C)   TempSrc: Oral Oral Oral   SpO2: 90% 95% 93% 92%  Weight:      Height:       No intake or output data in the 24 hours ending 10/14/23 1151  Filed Weights   10/10/23 0800 10/10/23 1335  Weight: 103.9 kg 99.2 kg    Examination:  General exam: Pleasant and interactive. Respiratory system: Conducted upper airway sounds.  Poor inspiratory effort.  On room air today. Cardiovascular system: S1 & S2 heard, RRR. No JVD, murmurs, rubs, gallops or clicks.  1+ bilateral pedal edema. Wearing TED hoses.  Gastrointestinal system: Abdomen is nondistended, soft and nontender. No organomegaly or masses felt. Normal bowel sounds heard.  Patient has large midline hernia without complication.  Without tenderness. Central nervous system:  Alert and oriented. No focal neurological deficits. Extremities: Symmetric 5 x 5 power. Skin: No rashes, lesions or ulcers Psychiatry: Judgement and insight appear normal. Mood & affect appropriate.     Data Reviewed: I have personally  reviewed following labs and imaging studies  CBC: Recent Labs  Lab 10/10/23 0544 10/11/23 0302 10/12/23 0304 10/13/23 0445 10/14/23 0506  WBC 35.3* 32.7* 28.0* 26.8* 20.9*  NEUTROABS  --   --  22.5* 21.2* 16.3*  HGB 13.3 12.8* 12.2* 13.0 12.3*  HCT 39.6 39.5 37.2* 41.8 39.3  MCV 98.5 100.3* 98.7 101.2* 100.0  PLT 316 299 296 334 301   Basic Metabolic Panel: Recent Labs  Lab 10/09/23 1730 10/10/23 0615 10/11/23 0302 10/12/23 0642  NA 134* 133* 131* 133*  K 3.8 3.2* 3.5 3.9  CL 94* 96* 95* 95*  CO2 27 26 27 26   GLUCOSE 116* 155* 96 88  BUN 11 14 10 10   CREATININE 1.02 0.89 0.80 0.80  CALCIUM  10.1 9.8 9.4 9.8  MG  --   --   --  2.0   GFR: Estimated Creatinine Clearance: 100.9 mL/min (by C-G formula based on SCr of 0.8 mg/dL). Liver Function Tests: Recent Labs  Lab 10/10/23 0615 10/12/23 0642  AST 24 56*  ALT 65* 86*  ALKPHOS 91 148*  BILITOT 0.3 0.4  PROT 5.8* 6.3*  ALBUMIN 2.6* 2.5*   No results for input(s): LIPASE, AMYLASE in the last 168 hours. No results for input(s): AMMONIA in the last 168 hours. Coagulation Profile: Recent Labs  Lab 10/09/23 1818  INR 1.1   Cardiac Enzymes: No results for input(s): CKTOTAL, CKMB, CKMBINDEX, TROPONINI in the last 168 hours. BNP (last 3 results) Recent Labs    09/05/23 0126 10/09/23 1818  PROBNP 559.0* 650.0*   HbA1C: No results for input(s): HGBA1C in the last 72 hours. CBG: No results for input(s): GLUCAP in the last 168 hours. Lipid Profile: No results for input(s): CHOL, HDL, LDLCALC, TRIG, CHOLHDL, LDLDIRECT in the last 72 hours. Thyroid Function Tests: No results for input(s): TSH, T4TOTAL, FREET4, T3FREE, THYROIDAB in the last 72 hours. Anemia Panel: No results for input(s): VITAMINB12, FOLATE, FERRITIN, TIBC, IRON, RETICCTPCT in the last 72 hours. Sepsis Labs: Recent Labs  Lab 10/09/23 2216 10/10/23 0200 10/10/23 0544 10/11/23 0918  10/11/23 1228  PROCALCITON 0.27  --   --   --   --   LATICACIDVEN  --  2.3* 2.3* 1.9 1.8    Recent Results (from the past 240 hours)  Culture, blood (Routine x 2)     Status: None   Collection Time: 10/09/23  5:55 PM   Specimen: BLOOD  Result Value Ref Range Status   Specimen Description   Final    BLOOD BLOOD LEFT FOREARM Performed at [facility], [address] [place], [place] [address]    Special Requests   Final    BOTTLES DRAWN AEROBIC AND ANAEROBIC Blood Culture adequate volume Performed at [facility], [address] [place], [place] [address]    Culture   Final    NO GROWTH 5 DAYS Performed at [facility], [address], [place], [place] [address]    Report Status 10/14/2023 FINAL  Final  Culture, blood (Routine x 2)     Status: None   Collection Time: 10/09/23  8:10 PM   Specimen: BLOOD  Result Value Ref Range Status   Specimen Description   Final    BLOOD RIGHT ANTECUBITAL Performed at [facility], [address], [place], [place] [address]    Special Requests   Final    BOTTLES DRAWN AEROBIC AND ANAEROBIC Blood Culture adequate volume Performed at [facility], [address] [place], [place] [address]    Culture   Final    NO GROWTH 5 DAYS Performed at [facility], [address] [place], [place] [address]    Report Status 10/14/2023 FINAL  Final  Respiratory (~20 pathogens) panel by PCR     Status: None   Collection Time: 10/09/23 10:16 PM   Specimen: Nasopharyngeal Swab; Respiratory  Result Value Ref Range Status   Adenovirus NOT DETECTED NOT DETECTED Final   Coronavirus 229E NOT DETECTED NOT DETECTED Final    Comment: (NOTE) The Coronavirus on the Respiratory Panel, DOES NOT test for the novel  Coronavirus (2019 nCoV)    Coronavirus HKU1 NOT DETECTED NOT DETECTED Final   Coronavirus NL63 NOT DETECTED NOT DETECTED Final   Coronavirus OC43 NOT  DETECTED NOT DETECTED Final   Metapneumovirus NOT DETECTED NOT DETECTED Final   Rhinovirus / Enterovirus NOT DETECTED NOT DETECTED Final   Influenza A NOT DETECTED NOT DETECTED Final   Influenza B NOT DETECTED NOT DETECTED Final   Parainfluenza Virus 1 NOT DETECTED NOT DETECTED Final   Parainfluenza Virus 2 NOT DETECTED NOT DETECTED Final   Parainfluenza Virus 3 NOT DETECTED NOT DETECTED Final   Parainfluenza Virus 4 NOT DETECTED NOT DETECTED Final   Respiratory Syncytial Virus NOT DETECTED NOT DETECTED Final   Bordetella pertussis NOT DETECTED NOT DETECTED Final   Bordetella Parapertussis NOT DETECTED NOT DETECTED Final   Chlamydophila pneumoniae NOT DETECTED NOT DETECTED Final   Mycoplasma pneumoniae NOT DETECTED NOT DETECTED Final    Comment: Performed at [facility], [address] [place], [place] [address]  Expectorated Sputum Assessment w Gram Stain, Rflx to Resp Cult     Status: None   Collection Time: 10/09/23 10:16 PM   Specimen: Sputum  Result Value Ref Range Status   Specimen Description   Final    SPU Performed at [facility], [address], [place], [place] [address]    Special Requests   Final    Immunocompromised Performed at [facility], [address] [place], [place] [address]    Sputum evaluation   Final    THIS SPECIMEN IS ACCEPTABLE FOR SPUTUM CULTURE Performed at [facility], [address] [place], [place] [address]    Report Status 10/10/2023 FINAL  Final  Culture, Respiratory w Gram Stain     Status: None (Preliminary result)   Collection Time: 10/09/23 10:16 PM   Specimen: Sputum  Result Value Ref Range Status   Specimen Description SPU  Final   Special Requests Immunocompromised Reflexed from [id number]  Final   Gram Stain   Final    ABUNDANT WBC PRESENT, PREDOMINANTLY PMN NO ORGANISMS SEEN    Culture   Final    RARE PSEUDOMONAS AERUGINOSA SUSCEPTIBILITIES TO FOLLOW Performed at [facility], [address], [place], [place] [address]    Report Status PENDING  Incomplete  MRSA Next Gen by PCR, Nasal     Status: None   Collection Time: 10/10/23  1:40 PM   Specimen: Nasal Mucosa; Nasal Swab  Result Value Ref Range Status   MRSA by PCR Next Gen NOT DETECTED NOT DETECTED Final    Comment: (NOTE) The GeneXpert MRSA Assay (FDA approved for NASAL specimens  only), is one component of a comprehensive MRSA colonization surveillance program. It is not intended to diagnose MRSA infection nor to guide or monitor treatment for MRSA infections. Test performance is not FDA approved in patients less than 69 years old. Performed at [facility], [address], [place], [place] [address]          Radiology Studies: No results found.       Scheduled Meds:  enoxaparin  (LOVENOX ) injection  40 mg Subcutaneous Q24H   feeding supplement  237 mL Oral BID BM   furosemide   40 mg Oral Daily   guaiFENesin   1,200 mg Oral BID   ipratropium-albuterol   3 mL Nebulization BID   nicotine   21 mg Transdermal Daily   potassium chloride   40 mEq Oral Daily   tamsulosin   0.8 mg Oral QPC breakfast   Continuous Infusions:  ceFEPime  (MAXIPIME ) IV 2 g (10/14/23 0950)     LOS: 4 days    Time spent: 40 minutes    Renato Applebaum, MD [facility]

## 2023-10-14 NOTE — Evaluation (Signed)
 Occupational Therapy Evaluation Patient Details Name: [name] MRN: [id number] DOB: Mar 07, 1955 Today's Date: 10/14/2023   History of Present Illness   Pt is a 68 y/o male presenting to therapy following hospitalization admission on 10/09/2023 due to severe sepsis secondary to PNA, hypotension and B LE pitting edema. Pt having DOE, weakness and pleuritic L side chest pain with non-productive cough. Pt had completed course of ABX from prior hospitalization about one month ago. Pt PMH includes but is not limited to: COPD, anemia, colon cancer s/p sx and chemo, neuropathy, Parsonage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, CKD, and tobacco use.     Clinical Impressions PTA, pt lives with daughter and reports typically Modified Independent with ADLs, basic IADLs and mobility using Rollator. Pt presents now with minor deficits in cardiopulmonary endurance. Overall, pt able to manage in-room mobility and ADLs without physical assist or safety concerns. SpO2 90% on RA after > 10 min standing activities. Encouraged incentive spirometer/flutter valve use, frequent OOB activity and SpO2 monitoring at home via pulse ox. Anticipate no OT needs upon DC.     If plan is discharge home, recommend the following:   Help with stairs or ramp for entrance     Functional Status Assessment   Patient has had a recent decline in their functional status and demonstrates the ability to make significant improvements in function in a reasonable and predictable amount of time.     Equipment Recommendations   None recommended by OT     Recommendations for Other Services         Precautions/Restrictions   Precautions Precautions: Fall Precaution/Restrictions Comments: relatively low fall risk, monitor O2 Restrictions Weight Bearing Restrictions Per Provider Order: No     Mobility Bed Mobility Overal bed mobility: Modified Independent                  Transfers Overall transfer level: Modified  independent Equipment used: None Transfers: Sit to/from Stand Sit to Stand: Modified independent (Device/Increase time)                  Balance Overall balance assessment: Mild deficits observed, not formally tested                                         ADL either performed or assessed with clinical judgement   ADL Overall ADL's : Needs assistance/impaired Eating/Feeding: Independent   Grooming: Modified independent;Standing;Oral care Grooming Details (indicate cue type and reason): OT also assisted pt to wash hair standing at sink, able to stand > 10 min without issues Upper Body Bathing: Modified independent   Lower Body Bathing: Supervison/ safety;Sit to/from stand;Sitting/lateral leans   Upper Body Dressing : Modified independent   Lower Body Dressing: Supervision/safety;Sitting/lateral leans;Sit to/from stand   Toilet Transfer: Supervision/safety;Ambulation   Toileting- Clothing Manipulation and Hygiene: Supervision/safety;Sit to/from stand;Sitting/lateral lean               Vision Ability to See in Adequate Light: 0 Adequate Patient Visual Report: No change from baseline Vision Assessment?: No apparent visual deficits     Perception         Praxis         Pertinent Vitals/Pain Pain Assessment Pain Assessment: No/denies pain     Extremity/Trunk Assessment Upper Extremity Assessment Upper Extremity Assessment: Overall WFL for tasks assessed;Right hand dominant   Lower Extremity Assessment Lower Extremity Assessment: Defer to PT evaluation  Cervical / Trunk Assessment Cervical / Trunk Assessment: Normal   Communication Communication Communication: No apparent difficulties   Cognition Arousal: Alert Behavior During Therapy: WFL for tasks assessed/performed Cognition: No apparent impairments                               Following commands: Intact       Cueing  General Comments   Cueing Techniques:  Verbal cues      Exercises     Shoulder Instructions      Home Living Family/patient expects to be discharged to:: Private residence Living Arrangements: Children Available Help at Discharge: Family;Available PRN/intermittently Type of Home: House Home Access: Stairs to enter [employer] of Steps: 3 steep steps   Home Layout: One level     Bathroom Shower/Tub: Chief Strategy Officer: Standard Bathroom Accessibility: Yes   Home Equipment: Educational psychologist (4 wheels)   Additional Comments: daughter works; pt reports they only see each other around 1 hour per day      Prior Functioning/Environment Prior Level of Function : Independent/Modified Independent;History of Falls (last six months)             Mobility Comments: use of rollator for home and community navigation ADLs Comments: Indep with ADLs, IADLs. reports some recent issues stepping over tub    OT Problem List: Cardiopulmonary status limiting activity;Decreased activity tolerance   OT Treatment/Interventions: Self-care/ADL training;Therapeutic exercise;Energy conservation;DME and/or AE instruction;Therapeutic activities;Patient/family education;Balance training      OT Goals(Current goals can be found in the care plan section)   Acute Rehab OT Goals Patient Stated Goal: resolve PNA OT Goal Formulation: With patient Time For Goal Achievement: 10/28/23 Potential to Achieve Goals: Good   OT Frequency:  Min 2X/week    Co-evaluation              AM-PAC OT 6 Clicks Daily Activity     Outcome Measure Help from another person eating meals?: None Help from another person taking care of personal grooming?: A Little Help from another person toileting, which includes using toliet, bedpan, or urinal?: A Little Help from another person bathing (including washing, rinsing, drying)?: A Little Help from another person to put on and taking off regular upper body clothing?:  None Help from another person to put on and taking off regular lower body clothing?: A Little 6 Click Score: 20   End of Session Nurse Communication: Mobility status  Activity Tolerance: Patient tolerated treatment well Patient left: in bed;with call bell/phone within reach;with nursing/sitter in room  OT Visit Diagnosis: Muscle weakness (generalized) (M62.81)                Time: 9150-9076 OT Time Calculation (min): 34 min Charges:  OT General Charges $OT Visit: 1 Visit OT Evaluation $OT Eval Low Complexity: 1 Low OT Treatments $Self Care/Home Management : 8-22 mins  Mliss NOVAK, OTR/L Acute Rehab Services Office: [phone]   Mliss Fish 10/14/2023, 9:31 AM

## 2023-10-14 NOTE — Progress Notes (Addendum)
 Physical Therapy Treatment Patient Details Name: [name] MRN: [id number] DOB: 1956-01-28 Today's Date: 10/14/2023   History of Present Illness Pt is a 68 y/o male presenting to therapy following hospitalization admission on 10/09/2023 due to severe sepsis secondary to PNA, hypotension and B LE pitting edema. Pt having DOE, weakness and pleuritic L side chest pain with non-productive cough. Pt had completed course of ABX from prior hospitalization about one month ago. Pt PMH includes but is not limited to: COPD, anemia, colon cancer s/p sx and chemo, neuropathy, Parsonage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, CKD, and tobacco use.    PT Comments   Pt admitted with above diagnosis.  Pt currently with functional limitations due to the deficits listed below (see PT Problem List). Pt in bed when PT arrived, resting, easily roused and agreeable to therapy intervention. Pt reported need to void bladder. Pt demonstrated ability to perform bed mobility mod I, transfer tasks mod I no AD and short amb bout in personal room no AD mod I. Pt O2 saturation at rest on RA 92% and with mobility tasks in room maintained 90-91% on RA. Pt elected to use personal rollator for gait training in hallway with S and min cues for posture, proper distance from Rollator and coordinated breathing for 100 feet. Pt desaturated with exertion 85% requiring cues for purse lip breathing and seated therapeutic rest break seated EOB and 1:03 to recover to 90% on RA, pt indicated not feeling SOB and typically will desaturate around 88% on RA and quickly recover < 30s to 90s in home setting. Pt encouraged to use AD for energy conservation and monitor O2 with pt reporting having pulse oximeter and O2 concentrator in home setting. Pt verbalized understanding. Pt left seated EOB and all needs in place.  to Pt will benefit from acute skilled PT to increase their independence and safety with mobility to allow discharge.      If plan is discharge home, recommend  the following: A little help with walking and/or transfers;A little help with bathing/dressing/bathroom;Assistance with cooking/housework;Assist for transportation;Help with stairs or ramp for entrance   Can travel by private vehicle        Equipment Recommendations  None recommended by PT    Recommendations for Other Services       Precautions / Restrictions Precautions Precautions: Fall Precaution/Restrictions Comments: monitor O2 Restrictions Weight Bearing Restrictions Per Provider Order: No     Mobility  Bed Mobility Overal bed mobility: Modified Independent             General bed mobility comments: HOB elevated    Transfers Overall transfer level: Modified independent Equipment used: None Transfers: Sit to/from Stand Sit to Stand: Modified independent (Device/Increase time)                Ambulation/Gait Ambulation/Gait assistance: Supervision Gait Distance (Feet): 100 Feet Assistive device: Rollator (4 wheels) Gait Pattern/deviations: Decreased step length - right, Decreased step length - left, Wide base of support, Trunk flexed Gait velocity: decreased     General Gait Details: decreased stride length with step almost through pattern, min cues for posture and coordinated breathing with pt desaturating at end of amb bout to 85% on RA requring seated therapeutic rest break and 1:03 to recover to 90% on RA   Stairs             Wheelchair Mobility     Tilt Bed    Modified Rankin (Stroke Patients Only)       Balance Overall balance  assessment: Mild deficits observed, not formally tested                                          Communication Communication Communication: No apparent difficulties  Cognition Arousal: Alert Behavior During Therapy: WFL for tasks assessed/performed   PT - Cognitive impairments: No apparent impairments                         Following commands: Intact      Cueing  Cueing Techniques: Verbal cues  Exercises      General Comments        Pertinent Vitals/Pain Pain Assessment Pain Assessment: 0-10 Pain Score: 3  Pain Location: neck and shoulders--chronic and L side pleural soreness increasing with coughing Pain Descriptors / Indicators: Aching, Discomfort, Dull, Grimacing Pain Intervention(s): Monitored during session    Home Living Family/patient expects to be discharged to:: Private residence Living Arrangements: Children Available Help at Discharge: Family;Available PRN/intermittently Type of Home: House Home Access: Stairs to enter   [employer] of Steps: 3 steep steps   Home Layout: One level Home Equipment: Educational psychologist (4 wheels) Additional Comments: daughter works; pt reports they only see each other around 1 hour per day    Prior Function            PT Goals (current goals can now be found in the care plan section) Acute Rehab PT Goals Patient Stated Goal: to have the PNA out of my system and get home PT Goal Formulation: With patient Time For Goal Achievement: 10/27/23 Potential to Achieve Goals: Good Progress towards PT goals: Progressing toward goals    Frequency    Min 3X/week      PT Plan      Co-evaluation              AM-PAC PT 6 Clicks Mobility   Outcome Measure  Help needed turning from your back to your side while in a flat bed without using bedrails?: None Help needed moving from lying on your back to sitting on the side of a flat bed without using bedrails?: None Help needed moving to and from a bed to a chair (including a wheelchair)?: None Help needed standing up from a chair using your arms (e.g., wheelchair or bedside chair)?: None Help needed to walk in hospital room?: None Help needed climbing 3-5 steps with a railing? : A Little 6 Click Score: 23    End of Session Equipment Utilized During Treatment: Gait belt Activity Tolerance: Patient tolerated treatment  well;No increased pain Patient left: with call bell/phone within reach;in bed Nurse Communication: Mobility status;Other (comment) (O2 findings) PT Visit Diagnosis: Unsteadiness on feet (R26.81);History of falling (Z91.81);Pain;Difficulty in walking, not elsewhere classified (R26.2) Pain - Right/Left:  (neck and chest and shoulders)     Time: 8886-8868 PT Time Calculation (min) (ACUTE ONLY): 18 min  Charges:    $Gait Training: 8-22 mins PT General Charges $$ ACUTE PT VISIT: 1 Visit                     [place], PT Acute Rehab    [place] VEAR Drone 10/14/2023, 11:37 AM

## 2023-10-15 DIAGNOSIS — A419 Sepsis, unspecified organism: Secondary | ICD-10-CM | POA: Diagnosis not present

## 2023-10-15 DIAGNOSIS — J189 Pneumonia, unspecified organism: Secondary | ICD-10-CM | POA: Diagnosis not present

## 2023-10-15 LAB — CBC WITH DIFFERENTIAL/PLATELET
Abs Immature Granulocytes: 0.75 K/uL — ABNORMAL HIGH (ref 0.00–0.07)
Basophils Absolute: 0.1 K/uL (ref 0.0–0.1)
Basophils Relative: 1 %
Eosinophils Absolute: 0.2 K/uL (ref 0.0–0.5)
Eosinophils Relative: 1 %
HCT: 38.8 % — ABNORMAL LOW (ref 39.0–52.0)
Hemoglobin: 12.1 g/dL — ABNORMAL LOW (ref 13.0–17.0)
Immature Granulocytes: 4 %
Lymphocytes Relative: 7 %
Lymphs Abs: 1.4 K/uL (ref 0.7–4.0)
MCH: 31 pg (ref 26.0–34.0)
MCHC: 31.2 g/dL (ref 30.0–36.0)
MCV: 99.5 fL (ref 80.0–100.0)
Monocytes Absolute: 2.2 K/uL — ABNORMAL HIGH (ref 0.1–1.0)
Monocytes Relative: 12 %
Neutro Abs: 14.2 K/uL — ABNORMAL HIGH (ref 1.7–7.7)
Neutrophils Relative %: 75 %
Platelets: 337 K/uL (ref 150–400)
RBC: 3.9 MIL/uL — ABNORMAL LOW (ref 4.22–5.81)
RDW: 13.6 % (ref 11.5–15.5)
WBC: 18.7 K/uL — ABNORMAL HIGH (ref 4.0–10.5)
nRBC: 0 % (ref 0.0–0.2)

## 2023-10-15 LAB — MAGNESIUM: Magnesium: 2 mg/dL (ref 1.7–2.4)

## 2023-10-15 LAB — BASIC METABOLIC PANEL WITH GFR
Anion gap: 11 (ref 5–15)
BUN: 14 mg/dL (ref 8–23)
CO2: 29 mmol/L (ref 22–32)
Calcium: 9.9 mg/dL (ref 8.9–10.3)
Chloride: 97 mmol/L — ABNORMAL LOW (ref 98–111)
Creatinine, Ser: 0.84 mg/dL (ref 0.61–1.24)
GFR, Estimated: >60 mL/min (ref >60)
Glucose, Bld: 94 mg/dL (ref 70–99)
Potassium: 3.9 mmol/L (ref 3.5–5.1)
Sodium: 137 mmol/L (ref 135–145)

## 2023-10-15 MED ORDER — FUROSEMIDE 40 MG PO TABS: 40.0000 mg | ORAL_TABLET | Freq: Every day | ORAL | 2 refills | Status: DC

## 2023-10-15 MED ORDER — LEVOFLOXACIN 750 MG PO TABS: 750.0000 mg | ORAL_TABLET | Freq: Every day | ORAL | 0 refills | Status: AC

## 2023-10-15 MED ORDER — POTASSIUM CHLORIDE CRYS ER 20 MEQ PO TBCR: 20.0000 meq | EXTENDED_RELEASE_TABLET | Freq: Every day | ORAL | 0 refills | Status: DC

## 2023-10-15 MED ORDER — GUAIFENESIN ER 600 MG PO TB12: 1200.0000 mg | ORAL_TABLET | Freq: Two times a day (BID) | ORAL | 0 refills | Status: AC

## 2023-10-15 NOTE — Progress Notes (Signed)
 Discharge instructions given to patient questions asked and answered.

## 2023-10-15 NOTE — Discharge Summary (Signed)
 Physician Discharge Summary  JAKOTA MANTHEI FMW:[id number] DOB: 03/16/1955 DOA: 10/09/2023  PCP: Renato Dorothey HERO, NP  Admit date: 10/09/2023 Discharge date: 10/15/2023  Admitted From: Home Disposition: Home with home health  Recommendations for Outpatient Follow-up:  Follow up with PCP in 1-2 weeks Please obtain BMP/CBC in one week Will send referral to pulmonary for follow-up  Home Health: PT Equipment/Devices: Oxygen available at home  Discharge Condition: Fair CODE STATUS: Full code Diet recommendation: Low-salt diet  Discharge summary: 68 year old with history of COPD, chronic hypoxemia on intermittent oxygen use, ongoing smoker recently treated for pneumonia a month ago came back to the emergency room with persistent dyspnea with exertion, generalized weakness, pleuritic left-sided chest pain, nonproductive cough.  In the emergency room chest x-ray and CT scan consistent with bilateral multifocal pneumonia.  WBC count was 35.3.  He was admitted and treated with broad-spectrum antibiotics then with cefepime .  Today is day 6 of IV antibiotics.  Clinically improving.  Does have significant underlying COPD and still smokes.  Stabilized to go home.  Severe sepsis secondary to bilateral, multifocal pneumonia.  Presented with leukocytosis, lactic acidosis and tachycardia.  CT scan with bilateral pneumonia.  No cavitation on the CT scan. Recent hospitalization.   Treated with vancomycin  and cefepime .   Vancomycin  72 hours -negative cultures -MRSA swab negative.  Discontinued.   Cefepime  day 6 today.  Blood cultures negative.  Sputum culture with rare Pseudomonas this is likely colonized.  Given significant infection, will treat with additional 4 days of Levaquin  to complete total 10 days of therapy for complicated pneumonia. Extensive counseling to quit smoking done. Patient will continue to use chest patient therapy, incentive at home. RVP panel, COVID, flu and RSV were  negative. Patient is already optimized on COPD therapy. He will benefit with follow-up at pulmonary clinic, referral sent.   Bilateral leg swelling: Recently started.  Elevated BNP.  Previous normal echo.  Echocardiogram with normal ejection fraction.  Blood pressures are adequate.   Changing chlorthalidone  to Lasix  40 mg daily, potassium 20 mEq daily on discharge.  TED hose.   Smoker: Counseled to quit.  Encouraged to use nicotine  patch.   Hypokalemia: Replaced and adequate.  Discharging on supplemental potassium as he is going home on oral Lasix .   Obesity class II: Recommended weight loss.  Stable to discharge home.  Patient does have oxygen at home as needed.   Discharge Diagnoses:  Principal Problem:   Sepsis (HCC) Active Problems:   Multifocal pneumonia   Acute exacerbation of CHF (congestive heart failure) (HCC)   Sepsis due to pneumonia [facility])    Discharge Instructions  Discharge Instructions     Diet - low sodium heart healthy   Complete by: As directed    Increase activity slowly   Complete by: As directed       Allergies as of 10/15/2023   No Known Allergies      Medication List     STOP taking these medications    chlorthalidone  25 MG tablet Commonly known as: HYGROTON        TAKE these medications    albuterol  (2.5 MG/3ML) 0.083% nebulizer solution Commonly known as: PROVENTIL  Take 3 mLs (2.5 mg total) by nebulization every 4 (four) hours as needed for wheezing or shortness of breath. What changed: Another medication with the same name was changed. Make sure you understand how and when to take each.   albuterol  108 (90 Base) MCG/ACT inhaler Commonly known as: VENTOLIN  HFA Inhale 2 puffs  into the lungs every 4 (four) hours as needed. What changed: reasons to take this   b complex vitamins capsule Take 1 capsule by mouth daily.   calcium  carbonate 500 MG chewable tablet Commonly known as: TUMS - dosed in mg elemental calcium  Chew  500-1,000 mg by mouth 2 (two) times daily as needed for indigestion or heartburn.   furosemide  40 MG tablet Commonly known as: LASIX  Take 1 tablet (40 mg total) by mouth daily.   guaiFENesin  600 MG 12 hr tablet Commonly known as: MUCINEX  Take 2 tablets (1,200 mg total) by mouth 2 (two) times daily for 14 days.   ipratropium-albuterol  0.5-2.5 (3) MG/3ML Soln Commonly known as: DUONEB Inhale 3 mLs into the lungs every 6 (six) hours as needed. What changed:  when to take this additional instructions   levofloxacin  750 MG tablet Commonly known as: Levaquin  Take 1 tablet (750 mg total) by mouth daily for 4 days.   linaclotide 145 MCG Caps capsule Commonly known as: LINZESS Take 145 mcg by mouth daily as needed (for constipation).   Magnesium 300 MG Tabs Take 600 mg by mouth daily.   oxyCODONE -acetaminophen  5-325 MG tablet Commonly known as: PERCOCET/ROXICET Take 1 tablet by mouth every 6 (six) hours as needed for severe pain or moderate pain. What changed:  how much to take when to take this   potassium chloride  SA 20 MEQ tablet Commonly known as: KLOR-CON  M Take 1 tablet (20 mEq total) by mouth daily.   promethazine 25 MG tablet Commonly known as: PHENERGAN Take 25 mg by mouth every 6 (six) hours as needed for nausea or vomiting.   tamsulosin  0.4 MG Caps capsule Commonly known as: FLOMAX  Take 1 capsule (0.4 mg total) by mouth 2 (two) times daily. What changed:  how much to take when to take this   Trelegy Ellipta 100-62.5-25 MCG/ACT Aepb Generic drug: Fluticasone-Umeclidin-Vilant Inhale 1 puff into the lungs daily.   Vitamin D3 125 MCG (5000 UT) Tabs Take 10,000 Units by mouth daily.        Follow-up Information     [facility]  Follow up.   Contact information: [address] [place] [address] [phone]                No Known Allergies  Consultations: None   Procedures/Studies: ECHOCARDIOGRAM  COMPLETE Result Date: 10/11/2023    ECHOCARDIOGRAM REPORT   Patient Name:   CEDRIK HEINDL Date of Exam: 10/11/2023 Medical Rec #:  [id number]         Height:       68.0 in Accession #:    [id number]        Weight:       218.7 lb Date of Birth:  August 26, 1955        BSA:          2.123 m Patient Age:    67 years          BP:           101/48 mmHg Patient Gender: M                 HR:           105 bpm. Exam Location:  Inpatient Procedure: 2D Echo, Cardiac Doppler, Color Doppler and Intracardiac            Opacification Agent (Both Spectral and Color Flow Doppler were            utilized  during procedure). Indications:    CHF-Acute Diastolic I50.31  History:        Patient has prior history of Echocardiogram examinations, most                 recent 01/13/2021. COPD.  Sonographer:    Jayson Gaskins Referring Phys: [id number] [facility]  Sonographer Comments: Technically difficult study due to poor echo windows. IMPRESSIONS  1. Technically difficult study, very limited views. Left ventricular ejection fraction, by estimation, is 60 to 65%. The left ventricle has normal function. The left ventricle has no regional wall motion abnormalities.  2. Right ventricular systolic function is normal. The right ventricular size is normal.  3. The mitral valve was not well visualized. No evidence of mitral valve regurgitation.  4. The aortic valve was not well visualized. Aortic valve regurgitation is not visualized. No aortic stenosis is present. FINDINGS  Left Ventricle: Left ventricular ejection fraction, by estimation, is 60 to 65%. The left ventricle has normal function. The left ventricle has no regional wall motion abnormalities. Definity  contrast agent was given IV to delineate the left ventricular  endocardial borders. The left ventricular internal cavity size was normal in size. Suboptimal image quality limits for assessment of left ventricular hypertrophy. Left ventricular diastolic parameters are indeterminate. Right  Ventricle: The right ventricular size is normal. No increase in right ventricular wall thickness. Right ventricular systolic function is normal. Left Atrium: Left atrial size was not well visualized. Right Atrium: Right atrial size was not well visualized. Pericardium: There is no evidence of pericardial effusion. Mitral Valve: The mitral valve was not well visualized. No evidence of mitral valve regurgitation. Tricuspid Valve: The tricuspid valve is not well visualized. Tricuspid valve regurgitation is trivial. Aortic Valve: The aortic valve was not well visualized. Aortic valve regurgitation is not visualized. No aortic stenosis is present. Aortic valve mean gradient measures 5.0 mmHg. Aortic valve peak gradient measures 8.0 mmHg. Aortic valve area, by VTI measures 3.12 cm. Pulmonic Valve: The pulmonic valve was not well visualized. Pulmonic valve regurgitation is not visualized. Aorta: The aortic root was not well visualized. IAS/Shunts: The interatrial septum was not well visualized.  LEFT VENTRICLE PLAX 2D LVOT diam:     1.90 cm   Diastology LV SV:         65        LV e' medial:    5.44 cm/s LV SV Index:   31        LV E/e' medial:  11.1 LVOT Area:     2.84 cm  LV e' lateral:   7.51 cm/s                          LV E/e' lateral: 8.0  RIGHT VENTRICLE RV S prime:     16.80 cm/s TAPSE (M-mode): 2.1 cm LEFT ATRIUM           Index LA Vol (A4C): 16.2 ml 7.63 ml/m  AORTIC VALVE AV Area (Vmax):    2.71 cm AV Area (Vmean):   2.59 cm AV Area (VTI):     3.12 cm AV Vmax:           141.00 cm/s AV Vmean:          105.000 cm/s AV VTI:            0.210 m AV Peak Grad:      8.0 mmHg AV Mean Grad:      5.0  mmHg LVOT Vmax:         135.00 cm/s LVOT Vmean:        95.800 cm/s LVOT VTI:          0.231 m LVOT/AV VTI ratio: 1.10 MITRAL VALVE MV Area (PHT): 3.02 cm    SHUNTS MV Decel Time: 251 msec    Systemic VTI:  0.23 m MV E velocity: 60.40 cm/s  Systemic Diam: 1.90 cm MV A velocity: 87.40 cm/s MV E/A ratio:  0.69 Lonni Nanas MD Electronically signed by Lonni Nanas MD Signature Date/Time: 10/11/2023/12:42:32 PM    Final    CT Angio Chest PE W and/or Wo Contrast Result Date: 10/09/2023 CLINICAL DATA:  Concern for pulmonary embolism. Shortness of breath. EXAM: CT ANGIOGRAPHY CHEST WITH CONTRAST TECHNIQUE: Multidetector CT imaging of the chest was performed using the standard protocol during bolus administration of intravenous contrast. Multiplanar CT image reconstructions and MIPs were obtained to evaluate the vascular anatomy. RADIATION DOSE REDUCTION: This exam was performed according to the departmental dose-optimization program which includes automated exposure control, adjustment of the mA and/or kV according to patient size and/or use of iterative reconstruction technique. CONTRAST:  75mL OMNIPAQUE  IOHEXOL  350 MG/ML SOLN COMPARISON:  Chest CT dated 09/05/2023. FINDINGS: Cardiovascular: There is no cardiomegaly or pericardial effusion. There is coronary vascular calcification of the LAD. Mild atherosclerotic calcification of the thoracic aorta. No aneurysmal dilatation. No pulmonary artery embolus identified. Mediastinum/Nodes: No hilar or mediastinal adenopathy. The esophagus is grossly unremarkable no mediastinal fluid collection. Lungs/Pleura: Background of emphysema. New area of consolidation in the superior segment of the left lower lobe consistent with pneumonia. Large area of consolidation in the right upper lobe the has slightly contracted since the prior CT. Several small cystic spaces within the consolidation measuring up to 2.7 x 1.6 cm may be related to background of emphysema or represent cavitary changes. Underlying mass is not excluded. Continued follow-up to resolution recommended. There is no pleural effusion or pneumothorax. The central airways are patent. Upper Abdomen: Partially visualized midline ventral hernia containing a segment of the transverse colon. Small bilateral renal nonobstructing  calculi. Musculoskeletal: Degenerative changes of the spine. No acute osseous pathology. Review of the MIP images confirms the above findings. IMPRESSION: 1. No CT evidence of pulmonary artery embolus. 2. New area of consolidation in the superior segment of the left lower lobe consistent with pneumonia. Large area of consolidation in the right upper lobe. Follow-up to resolution recommended. 3. Partially visualized midline ventral hernia containing a segment of the transverse colon. 4. Small bilateral renal nonobstructing calculi. 5. Aortic Atherosclerosis (ICD10-I70.0) and Emphysema (ICD10-J43.9). Electronically Signed   By: Vanetta Chou M.D.   On: 10/09/2023 18:55   DG Chest 2 View Result Date: 10/09/2023 CLINICAL DATA:  Chest pain. EXAM: CHEST - 2 VIEW COMPARISON:  September 07, 2023. FINDINGS: The heart size and mediastinal contours are within normal limits. Left subclavian Port-A-Cath is unchanged. Cavitary lesion is now noted in right upper lobe concerning for cavitary pneumonia or possibly malignancy. Increased left upper lobe opacity is noted concerning for pneumonia. Minimal left basilar subsegmental atelectasis or scarring is noted. The visualized skeletal structures are unremarkable. IMPRESSION: Cavitary lesion is now noted in right upper lobe concerning for cavitary pneumonia or possibly malignancy. Increased left upper lobe opacity is noted concerning for pneumonia. CT scan of the chest is recommended for further evaluation. Electronically Signed   By: Lynwood Landy Raddle M.D.   On: 10/09/2023 17:45   (Echo, Carotid, EGD,  Colonoscopy, ERCP)    Subjective: Patient seen and examined.  No overnight events.  Gets some shortness of breath on initial walking.  Still has some dry cough.  Afebrile.  Comfortable with plan to go home.   Discharge Exam: Vitals:   10/14/23 2031 10/15/23 0507  BP: (!) 147/64 107/64  Pulse: 100 (!) 105  Resp: 18 19  Temp: 99.1 F (37.3 C) 98.2 F (36.8 C)  SpO2: 94%  95%   Vitals:   10/14/23 1844 10/14/23 1955 10/14/23 2031 10/15/23 0507  BP: (!) 145/80  (!) 147/64 107/64  Pulse: (!) 105  100 (!) 105  Resp: 18  18 19   Temp: 98.7 F (37.1 C)  99.1 F (37.3 C) 98.2 F (36.8 C)  TempSrc: Oral  Oral Oral  SpO2: 92% 94% 94% 95%  Weight:      Height:        General: Pt is alert, awake, not in acute distress.  On room air at rest. Cardiovascular: RRR, S1/S2 +, no rubs, no gallops Respiratory: CTA bilaterally, no wheezing, no rhonchi, some conducted upper airway sounds.  Not in any distress. Abdominal: Soft, NT, ND, bowel sounds +, large midline umbilical hernia that is readily reducible. Extremities: Trace bilateral pedal edema.    The results of significant diagnostics from this hospitalization (including imaging, microbiology, ancillary and laboratory) are listed below for reference.     Microbiology: Recent Results (from the past 240 hours)  Culture, blood (Routine x 2)     Status: None   Collection Time: 10/09/23  5:55 PM   Specimen: BLOOD  Result Value Ref Range Status   Specimen Description   Final    BLOOD BLOOD LEFT FOREARM Performed at [facility], [address], [place], [place] [address]    Special Requests   Final    BOTTLES DRAWN AEROBIC AND ANAEROBIC Blood Culture adequate volume Performed at [facility], [address], [place], [place] [address]    Culture   Final    NO GROWTH 5 DAYS Performed at [facility], [address] [place], [place] [address]    Report Status 10/14/2023 FINAL  Final  Culture, blood (Routine x 2)     Status: None   Collection Time: 10/09/23  8:10 PM   Specimen: BLOOD  Result Value Ref Range Status   Specimen Description   Final    BLOOD RIGHT ANTECUBITAL Performed at [facility], [address], [place], [place] [address]    Special Requests   Final    BOTTLES DRAWN AEROBIC AND ANAEROBIC Blood Culture adequate  volume Performed at [facility], [address] [place], [place] [address]    Culture   Final    NO GROWTH 5 DAYS Performed at [facility], [address] [place], [place] [address]    Report Status 10/14/2023 FINAL  Final  Respiratory (~20 pathogens) panel by PCR     Status: None   Collection Time: 10/09/23 10:16 PM   Specimen: Nasopharyngeal Swab; Respiratory  Result Value Ref Range Status   Adenovirus NOT DETECTED NOT DETECTED Final   Coronavirus 229E NOT DETECTED NOT DETECTED Final    Comment: (NOTE) The Coronavirus on the Respiratory Panel, DOES NOT test for the novel  Coronavirus (2019 nCoV)    Coronavirus HKU1 NOT DETECTED NOT DETECTED Final   Coronavirus NL63 NOT DETECTED NOT DETECTED Final   Coronavirus OC43 NOT DETECTED NOT DETECTED Final   Metapneumovirus NOT DETECTED  NOT DETECTED Final   Rhinovirus / Enterovirus NOT DETECTED NOT DETECTED Final   Influenza A NOT DETECTED NOT DETECTED Final   Influenza B NOT DETECTED NOT DETECTED Final   Parainfluenza Virus 1 NOT DETECTED NOT DETECTED Final   Parainfluenza Virus 2 NOT DETECTED NOT DETECTED Final   Parainfluenza Virus 3 NOT DETECTED NOT DETECTED Final   Parainfluenza Virus 4 NOT DETECTED NOT DETECTED Final   Respiratory Syncytial Virus NOT DETECTED NOT DETECTED Final   Bordetella pertussis NOT DETECTED NOT DETECTED Final   Bordetella Parapertussis NOT DETECTED NOT DETECTED Final   Chlamydophila pneumoniae NOT DETECTED NOT DETECTED Final   Mycoplasma pneumoniae NOT DETECTED NOT DETECTED Final    Comment: Performed at [facility], [address] [place], [place] [address]  Expectorated Sputum Assessment w Gram Stain, Rflx to Resp Cult     Status: None   Collection Time: 10/09/23 10:16 PM   Specimen: Sputum  Result Value Ref Range Status   Specimen Description   Final    SPU Performed at [facility], [address], [place], [place] [address]    Special  Requests   Final    Immunocompromised Performed at [facility], [address] [place], [place] [address]    Sputum evaluation   Final    THIS SPECIMEN IS ACCEPTABLE FOR SPUTUM CULTURE Performed at [facility], [address] [place], [place] [address]    Report Status 10/10/2023 FINAL  Final  Culture, Respiratory w Gram Stain     Status: None   Collection Time: 10/09/23 10:16 PM   Specimen: Sputum  Result Value Ref Range Status   Specimen Description SPU  Final   Special Requests Immunocompromised Reflexed from [id number]  Final   Gram Stain   Final    ABUNDANT WBC PRESENT, PREDOMINANTLY PMN NO ORGANISMS SEEN Performed at [facility], [address], [place], [place] [address]    Culture FEW PSEUDOMONAS AERUGINOSA  Final   Report Status 10/14/2023 FINAL  Final   Organism ID, Bacteria PSEUDOMONAS AERUGINOSA  Final      Susceptibility   Pseudomonas aeruginosa - MIC*    MEROPENEM <=0.25 SENSITIVE Sensitive     CIPROFLOXACIN 0.25 SENSITIVE Sensitive     IMIPENEM 2 SENSITIVE Sensitive     PIP/TAZO Value in next row Sensitive ug/mL     16 SENSITIVEThis is a modified FDA-approved test that has been validated and its performance characteristics determined by the reporting laboratory.  This laboratory is certified under the Clinical Laboratory Improvement Amendments CLIA as qualified to perform high complexity clinical laboratory testing.    CEFTAZIDIME/AVIBACTAM Value in next row Sensitive ug/mL     16 SENSITIVEThis is a modified FDA-approved test that has been validated and its performance characteristics determined by the reporting laboratory.  This laboratory is certified under the Clinical Laboratory Improvement Amendments CLIA as qualified to perform high complexity clinical laboratory testing.    CEFTOLOZANE/TAZOBACTAM Value in next row Sensitive ug/mL     16 SENSITIVEThis is a modified FDA-approved test that has been validated and its performance  characteristics determined by the reporting laboratory.  This laboratory is certified under the Clinical Laboratory Improvement Amendments CLIA as qualified to perform high complexity clinical laboratory testing.    TOBRAMYCIN Value in next row Sensitive      16 SENSITIVEThis is a modified FDA-approved test that has been validated and its performance characteristics determined by the reporting laboratory.  This laboratory is certified  under the Clinical Laboratory Improvement Amendments CLIA as qualified to perform high complexity clinical laboratory testing.    CEFTAZIDIME Value in next row Sensitive      16 SENSITIVEThis is a modified FDA-approved test that has been validated and its performance characteristics determined by the reporting laboratory.  This laboratory is certified under the Clinical Laboratory Improvement Amendments CLIA as qualified to perform high complexity clinical laboratory testing.    * FEW PSEUDOMONAS AERUGINOSA  MRSA Next Gen by PCR, Nasal     Status: None   Collection Time: 10/10/23  1:40 PM   Specimen: Nasal Mucosa; Nasal Swab  Result Value Ref Range Status   MRSA by PCR Next Gen NOT DETECTED NOT DETECTED Final    Comment: (NOTE) The GeneXpert MRSA Assay (FDA approved for NASAL specimens only), is one component of a comprehensive MRSA colonization surveillance program. It is not intended to diagnose MRSA infection nor to guide or monitor treatment for MRSA infections. Test performance is not FDA approved in patients less than 22 years old. Performed at [facility], [address] [place], [place] [address]      Labs: BNP (last 3 results) No results for input(s): BNP in the last 8760 hours. Basic Metabolic Panel: Recent Labs  Lab 10/09/23 1730 10/10/23 0615 10/11/23 0302 10/12/23 0642 10/15/23 0530  NA 134* 133* 131* 133* 137  K 3.8 3.2* 3.5 3.9 3.9  CL 94* 96* 95* 95* 97*  CO2 27 26 27 26 29   GLUCOSE 116* 155* 96 88 94  BUN 11  14 10 10 14   CREATININE 1.02 0.89 0.80 0.80 0.84  CALCIUM  10.1 9.8 9.4 9.8 9.9  MG  --   --   --  2.0 2.0   Liver Function Tests: Recent Labs  Lab 10/10/23 0615 10/12/23 0642  AST 24 56*  ALT 65* 86*  ALKPHOS 91 148*  BILITOT 0.3 0.4  PROT 5.8* 6.3*  ALBUMIN 2.6* 2.5*   No results for input(s): LIPASE, AMYLASE in the last 168 hours. No results for input(s): AMMONIA in the last 168 hours. CBC: Recent Labs  Lab 10/11/23 0302 10/12/23 0304 10/13/23 0445 10/14/23 0506 10/15/23 0530  WBC 32.7* 28.0* 26.8* 20.9* 18.7*  NEUTROABS  --  22.5* 21.2* 16.3* 14.2*  HGB 12.8* 12.2* 13.0 12.3* 12.1*  HCT 39.5 37.2* 41.8 39.3 38.8*  MCV 100.3* 98.7 101.2* 100.0 99.5  PLT 299 296 334 301 337   Cardiac Enzymes: No results for input(s): CKTOTAL, CKMB, CKMBINDEX, TROPONINI in the last 168 hours. BNP: Invalid input(s): POCBNP CBG: No results for input(s): GLUCAP in the last 168 hours. D-Dimer No results for input(s): DDIMER in the last 72 hours. Hgb A1c No results for input(s): HGBA1C in the last 72 hours. Lipid Profile No results for input(s): CHOL, HDL, LDLCALC, TRIG, CHOLHDL, LDLDIRECT in the last 72 hours. Thyroid function studies No results for input(s): TSH, T4TOTAL, T3FREE, THYROIDAB in the last 72 hours.  Invalid input(s): FREET3 Anemia work up No results for input(s): VITAMINB12, FOLATE, FERRITIN, TIBC, IRON, RETICCTPCT in the last 72 hours. Urinalysis    Component Value Date/Time   COLORURINE YELLOW 10/09/2023 1755   APPEARANCEUR CLEAR 10/09/2023 1755   LABSPEC >1.046 (H) 10/09/2023 1755   PHURINE 7.5 10/09/2023 1755   GLUCOSEU NEGATIVE 10/09/2023 1755   HGBUR NEGATIVE 10/09/2023 1755   BILIRUBINUR NEGATIVE 10/09/2023 1755   KETONESUR NEGATIVE 10/09/2023 1755   PROTEINUR TRACE (A) 10/09/2023 1755   NITRITE NEGATIVE 10/09/2023 1755   LEUKOCYTESUR  NEGATIVE 10/09/2023 1755   Sepsis Labs Recent Labs  Lab  10/12/23 0304 10/13/23 0445 10/14/23 0506 10/15/23 0530  WBC 28.0* 26.8* 20.9* 18.7*   Microbiology Recent Results (from the past 240 hours)  Culture, blood (Routine x 2)     Status: None   Collection Time: 10/09/23  5:55 PM   Specimen: BLOOD  Result Value Ref Range Status   Specimen Description   Final    BLOOD BLOOD LEFT FOREARM Performed at [facility], [address], [place], [place] [address]    Special Requests   Final    BOTTLES DRAWN AEROBIC AND ANAEROBIC Blood Culture adequate volume Performed at [facility], [address] [place], [place] [address]    Culture   Final    NO GROWTH 5 DAYS Performed at [facility], [address], [place], [place] [address]    Report Status 10/14/2023 FINAL  Final  Culture, blood (Routine x 2)     Status: None   Collection Time: 10/09/23  8:10 PM   Specimen: BLOOD  Result Value Ref Range Status   Specimen Description   Final    BLOOD RIGHT ANTECUBITAL Performed at [facility], [address], [place], [place] [address]    Special Requests   Final    BOTTLES DRAWN AEROBIC AND ANAEROBIC Blood Culture adequate volume Performed at [facility], [address], [place], [place] [address]    Culture   Final    NO GROWTH 5 DAYS Performed at [facility], [address], [place], [place] [address]    Report Status 10/14/2023 FINAL  Final  Respiratory (~20 pathogens) panel by PCR     Status: None   Collection Time: 10/09/23 10:16 PM   Specimen: Nasopharyngeal Swab; Respiratory  Result Value Ref Range Status   Adenovirus NOT DETECTED NOT DETECTED Final   Coronavirus 229E NOT DETECTED NOT DETECTED Final    Comment: (NOTE) The Coronavirus on the Respiratory Panel, DOES NOT test for the novel  Coronavirus (2019 nCoV)    Coronavirus HKU1 NOT DETECTED NOT DETECTED Final   Coronavirus NL63 NOT DETECTED NOT DETECTED Final   Coronavirus OC43  NOT DETECTED NOT DETECTED Final   Metapneumovirus NOT DETECTED NOT DETECTED Final   Rhinovirus / Enterovirus NOT DETECTED NOT DETECTED Final   Influenza A NOT DETECTED NOT DETECTED Final   Influenza B NOT DETECTED NOT DETECTED Final   Parainfluenza Virus 1 NOT DETECTED NOT DETECTED Final   Parainfluenza Virus 2 NOT DETECTED NOT DETECTED Final   Parainfluenza Virus 3 NOT DETECTED NOT DETECTED Final   Parainfluenza Virus 4 NOT DETECTED NOT DETECTED Final   Respiratory Syncytial Virus NOT DETECTED NOT DETECTED Final   Bordetella pertussis NOT DETECTED NOT DETECTED Final   Bordetella Parapertussis NOT DETECTED NOT DETECTED Final   Chlamydophila pneumoniae NOT DETECTED NOT DETECTED Final   Mycoplasma pneumoniae NOT DETECTED NOT DETECTED Final    Comment: Performed at [facility], [address] [place], [place] [address]  Expectorated Sputum Assessment w Gram Stain, Rflx to Resp Cult     Status: None   Collection Time: 10/09/23 10:16 PM   Specimen: Sputum  Result Value Ref Range Status   Specimen Description   Final    SPU Performed at [facility], [address] [place], [place] [address]    Special Requests   Final    Immunocompromised Performed at [facility], [address] [place],  [place] [address]    Sputum evaluation   Final    THIS SPECIMEN IS ACCEPTABLE FOR SPUTUM CULTURE Performed at [facility], [address], [place], [place] [address]    Report Status 10/10/2023 FINAL  Final  Culture, Respiratory w Gram Stain     Status: None   Collection Time: 10/09/23 10:16 PM   Specimen: Sputum  Result Value Ref Range Status   Specimen Description SPU  Final   Special Requests Immunocompromised Reflexed from [id number]  Final   Gram Stain   Final    ABUNDANT WBC PRESENT, PREDOMINANTLY PMN NO ORGANISMS SEEN Performed at [facility], [address], [place], [place] [address]    Culture FEW PSEUDOMONAS AERUGINOSA   Final   Report Status 10/14/2023 FINAL  Final   Organism ID, Bacteria PSEUDOMONAS AERUGINOSA  Final      Susceptibility   Pseudomonas aeruginosa - MIC*    MEROPENEM <=0.25 SENSITIVE Sensitive     CIPROFLOXACIN 0.25 SENSITIVE Sensitive     IMIPENEM 2 SENSITIVE Sensitive     PIP/TAZO Value in next row Sensitive ug/mL     16 SENSITIVEThis is a modified FDA-approved test that has been validated and its performance characteristics determined by the reporting laboratory.  This laboratory is certified under the Clinical Laboratory Improvement Amendments CLIA as qualified to perform high complexity clinical laboratory testing.    CEFTAZIDIME/AVIBACTAM Value in next row Sensitive ug/mL     16 SENSITIVEThis is a modified FDA-approved test that has been validated and its performance characteristics determined by the reporting laboratory.  This laboratory is certified under the Clinical Laboratory Improvement Amendments CLIA as qualified to perform high complexity clinical laboratory testing.    CEFTOLOZANE/TAZOBACTAM Value in next row Sensitive ug/mL     16 SENSITIVEThis is a modified FDA-approved test that has been validated and its performance characteristics determined by the reporting laboratory.  This laboratory is certified under the Clinical Laboratory Improvement Amendments CLIA as qualified to perform high complexity clinical laboratory testing.    TOBRAMYCIN Value in next row Sensitive      16 SENSITIVEThis is a modified FDA-approved test that has been validated and its performance characteristics determined by the reporting laboratory.  This laboratory is certified under the Clinical Laboratory Improvement Amendments CLIA as qualified to perform high complexity clinical laboratory testing.    CEFTAZIDIME Value in next row Sensitive      16 SENSITIVEThis is a modified FDA-approved test that has been validated and its performance characteristics determined by the reporting laboratory.  This laboratory  is certified under the Clinical Laboratory Improvement Amendments CLIA as qualified to perform high complexity clinical laboratory testing.    * FEW PSEUDOMONAS AERUGINOSA  MRSA Next Gen by PCR, Nasal     Status: None   Collection Time: 10/10/23  1:40 PM   Specimen: Nasal Mucosa; Nasal Swab  Result Value Ref Range Status   MRSA by PCR Next Gen NOT DETECTED NOT DETECTED Final    Comment: (NOTE) The GeneXpert MRSA Assay (FDA approved for NASAL specimens only), is one component of a comprehensive MRSA colonization surveillance program. It is not intended to diagnose MRSA infection nor to guide or monitor treatment for MRSA infections. Test performance is not FDA approved in patients less than 63 years old. Performed at [facility], [address] [place], [place] [address]      Time coordinating discharge: 40 minutes  SIGNED:   Renato Applebaum, MD  [facility] 10/15/2023,  8:25 AM

## 2023-10-15 NOTE — Plan of Care (Signed)
   Problem: Education: Goal: Knowledge of General Education information will improve Description: Including pain rating scale, medication(s)/side effects and non-pharmacologic comfort measures Outcome: Progressing   Problem: Clinical Measurements: Goal: Ability to maintain clinical measurements within normal limits will improve Outcome: Progressing

## 2023-10-15 NOTE — Progress Notes (Signed)
 Patient Cefepime  was not given, and wants the missing dose given. Nurse on day shift forgot to squeeze Antibiotic from vial into saline bag. The patient who is alert and verbally responsive notice the Antibiotic still in the vial is requesting that the missed dose of Antibiotic be given to him. Museum/gallery exhibitions officer on duty notified.

## 2023-10-28 ENCOUNTER — Ambulatory Visit (INDEPENDENT_AMBULATORY_CARE_PROVIDER_SITE_OTHER)

## 2023-10-28 ENCOUNTER — Ambulatory Visit

## 2023-10-28 VITALS — BP 100/69 | HR 102 | Temp 98.0°F | Ht 68.0 in | Wt 204.0 lb

## 2023-10-28 DIAGNOSIS — Z23 Encounter for immunization: Secondary | ICD-10-CM | POA: Diagnosis not present

## 2023-10-28 DIAGNOSIS — J189 Pneumonia, unspecified organism: Secondary | ICD-10-CM | POA: Diagnosis not present

## 2023-10-28 DIAGNOSIS — J984 Other disorders of lung: Secondary | ICD-10-CM

## 2023-10-28 DIAGNOSIS — Z720 Tobacco use: Secondary | ICD-10-CM

## 2023-10-28 DIAGNOSIS — J449 Chronic obstructive pulmonary disease, unspecified: Secondary | ICD-10-CM

## 2023-10-28 DIAGNOSIS — R918 Other nonspecific abnormal finding of lung field: Secondary | ICD-10-CM | POA: Diagnosis not present

## 2023-10-28 DIAGNOSIS — F17210 Nicotine dependence, cigarettes, uncomplicated: Secondary | ICD-10-CM

## 2023-10-28 DIAGNOSIS — Z7185 Encounter for immunization safety counseling: Secondary | ICD-10-CM

## 2023-10-28 DIAGNOSIS — Z09 Encounter for follow-up examination after completed treatment for conditions other than malignant neoplasm: Secondary | ICD-10-CM | POA: Diagnosis not present

## 2023-10-28 DIAGNOSIS — J9611 Chronic respiratory failure with hypoxia: Secondary | ICD-10-CM

## 2023-10-28 MED ORDER — STIOLTO RESPIMAT 2.5-2.5 MCG/ACT IN AERS: 2.0000 | INHALATION_SPRAY | Freq: Every day | RESPIRATORY_TRACT | 4 refills | Status: DC

## 2023-10-28 MED ORDER — BUPROPION HCL ER (SR) 150 MG PO TB12: 150.0000 mg | ORAL_TABLET | Freq: Two times a day (BID) | ORAL | 1 refills | Status: AC

## 2023-10-28 MED ORDER — GUAIFENESIN 100 MG/5ML PO LIQD: 5.0000 mL | ORAL | Status: AC | PRN

## 2023-10-28 NOTE — Progress Notes (Signed)
Patient seen in the office today and instructed on use of stiolto.  Patient expressed understanding and demonstrated technique. 

## 2023-10-28 NOTE — Progress Notes (Signed)
 New Patient Pulmonology Office Visit   Subjective:  Patient ID: [name], male    DOB: August 04, 1955  MRN: [id number]  Referred by: Raenelle Coria, MD  CC:  Chief Complaint  Patient presents with   Consult    COPD, pul mass, and pneumonia     HPI [name] is a 68 y.o. male who is here after recent inpatient stay for multifocal pneumonia.  He is referred to pulmonary clinic for establishing care for chronic COPD.  He has a history of COPD, chronic hypoxemia on intermittent oxygen use.  He was admitted at the hospital for pneumonia in August and again in September/2025.  Both times he was treated with antibiotics-Levaquin . During second admission, he was found to have rare Pseudomonas in his sputum      ROS Review of symptoms negative except mentioned above   Allergies: Patient has no known allergies.  Current Outpatient Medications:    albuterol  (PROVENTIL ) (2.5 MG/3ML) 0.083% nebulizer solution, Take 3 mLs (2.5 mg total) by nebulization every 4 (four) hours as needed for wheezing or shortness of breath., Disp: 75 mL, Rfl: 12   albuterol  (VENTOLIN  HFA) 108 (90 Base) MCG/ACT inhaler, Inhale 2 puffs into the lungs every 4 (four) hours as needed. (Patient taking differently: Inhale 2 puffs into the lungs every 4 (four) hours as needed for shortness of breath or wheezing.), Disp: 18 g, Rfl: 4   b complex vitamins capsule, Take 1 capsule by mouth daily., Disp: , Rfl:    buPROPion (WELLBUTRIN SR) 150 MG 12 hr tablet, Take 1 tablet (150 mg total) by mouth 2 (two) times daily., Disp: 60 tablet, Rfl: 1   calcium  carbonate (TUMS - DOSED IN MG ELEMENTAL CALCIUM ) 500 MG chewable tablet, Chew 500-1,000 mg by mouth 2 (two) times daily as needed for indigestion or heartburn., Disp: , Rfl:    Cholecalciferol (VITAMIN D3) 125 MCG (5000 UT) TABS, Take 10,000 Units by mouth daily., Disp: , Rfl:    furosemide  (LASIX ) 40 MG tablet, Take 1 tablet (40 mg total) by mouth daily., Disp:  30 tablet, Rfl: 2   guaiFENesin  (MUCINEX ) 600 MG 12 hr tablet, Take 2 tablets (1,200 mg total) by mouth 2 (two) times daily for 14 days., Disp: 56 tablet, Rfl: 0   ipratropium-albuterol  (DUONEB) 0.5-2.5 (3) MG/3ML SOLN, Inhale 3 mLs into the lungs every 6 (six) hours as needed. (Patient taking differently: Inhale 3 mLs into the lungs See admin instructions. Nebulize 3 ml's and inhale into the lungs in the morning & at bedtime AND up to 2 times a day, additionally, as needed for shortness of breath or wheezing), Disp: 360 mL, Rfl: 1   linaclotide (LINZESS) 145 MCG CAPS capsule, Take 145 mcg by mouth daily as needed (for constipation)., Disp: , Rfl:    Magnesium 300 MG TABS, Take 600 mg by mouth daily., Disp: , Rfl:    oxyCODONE -acetaminophen  (PERCOCET/ROXICET) 5-325 MG tablet, Take 1 tablet by mouth every 6 (six) hours as needed for severe pain or moderate pain. (Patient taking differently: Take 2 tablets by mouth in the morning and at bedtime.), Disp: 20 tablet, Rfl: 0   potassium chloride  SA (KLOR-CON  M) 20 MEQ tablet, Take 1 tablet (20 mEq total) by mouth daily., Disp: 30 tablet, Rfl: 0   promethazine (PHENERGAN) 25 MG tablet, Take 25 mg by mouth every 6 (six) hours as needed for nausea or vomiting., Disp: , Rfl:    tamsulosin  (FLOMAX ) 0.4 MG CAPS capsule, Take 1 capsule (  0.4 mg total) by mouth 2 (two) times daily. (Patient taking differently: Take 0.8 mg by mouth daily after breakfast.), Disp: 180 capsule, Rfl: 4   Tiotropium Bromide-Olodaterol (STIOLTO RESPIMAT) 2.5-2.5 MCG/ACT AERS, Inhale 2 puffs into the lungs daily., Disp: 4 g, Rfl: 4  Current Facility-Administered Medications:    guaiFENesin  (ROBITUSSIN) 100 MG/5ML liquid 5 mL, 5 mL, Oral, Q4H PRN,  Past Medical History:  Diagnosis Date   Anemia    IRON 2005   Anxiety    Arthritis    LOWER LUMBAR STENOSIS , ARTHRITIS HANDS AND SHOULDERS   Asthma    Cancer (HCC) 2005   COLON SURGERY AND CHEMO   Complication of anesthesia    WOKE UP  DURING PORT PLACEMENT AND ENDOSCOPY, SLOW TO AWAKEN FROM COLON RESECTION 2010   COPD (chronic obstructive pulmonary disease) (HCC)    Dyspnea    WITH EXERTION   GERD (gastroesophageal reflux disease)    Hydrocele, right    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 No pertinent family history. Social History   Socioeconomic History   Marital status: Divorced    Spouse name: Not on file   Number of children: Not on file   Years of education: Not on file   Highest education level: Not on file  Occupational History   Not on file  Tobacco Use   Smoking status: Every Day    Current packs/day: 1.00    Average packs/day: 1 pack/day for 54.7 years (54.7 ttl pk-yrs)    Types: Cigarettes    Start date: 45   Smokeless tobacco: Never   Tobacco comments:    Has cut down from 3 cartons a day to 1 carton a day MMP   Vaping Use   Vaping status: Never Used  Substance and Sexual Activity   Alcohol use: No   Drug use: No   Sexual activity: Not on file  Other Topics Concern   Not on file  Social History Narrative   Not on file   Social Drivers of Health   Financial Resource Strain: Not on file  Food Insecurity: No Food Insecurity (10/10/2023)   Hunger Vital Sign    Worried About Running Out of Food in the Last Year: Never true    Ran Out of Food in the Last Year: Never true  Transportation Needs: No Transportation Needs (10/10/2023)   PRAPARE - Administrator, Civil Service (Medical): No    Lack of Transportation (Non-Medical): No   Physical Activity: Not on file  Stress: Not on file  Social Connections: Socially Isolated (10/10/2023)   Social Connection and Isolation Panel    Frequency of Communication with Friends and Family: More than three times a week    Frequency of Social Gatherings with Friends and Family: Once a week    Attends Religious Services: Never    Database administrator or Organizations: No    Attends Banker  Meetings: Never    Marital Status: Separated  Intimate Partner Violence: Not At Risk (10/10/2023)   Humiliation, Afraid, Rape, and Kick questionnaire    Fear of Current or Ex-Partner: No    Emotionally Abused: No    Physically Abused: No    Sexually Abused: No         Objective:  BP 100/69   Pulse (!) 102   Temp 98 F (36.7 C) (Oral)   Ht 5' 8 (1.727 m)   Wt 204 lb (92.5 kg)   SpO2 98%   BMI 31.02 kg/m    Physical Exam Constitutional:      General: He is not in acute distress.    Appearance: Normal appearance.  HENT:     Mouth/Throat:     Mouth: Mucous membranes are moist.  Cardiovascular:     Rate and Rhythm: Normal rate.  Pulmonary:     Effort: No respiratory distress.     Breath sounds: No wheezing or rales.  Musculoskeletal:     Right lower leg: No edema.     Left lower leg: No edema.  Skin:    General: Skin is warm.  Neurological:     Mental Status: He is alert and oriented to person, place, and time.  Psychiatric:        Mood and Affect: Mood normal.     Diagnostic Review:    Pft     No data to display         Reviewed recent admission and discharge notes from September/2025.  ECHO 09/2023  1. Technically difficult study, very limited views. Left ventricular  ejection fraction, by estimation, is 60 to 65%. The left ventricle has  normal function. The left ventricle has no regional wall motion  abnormalities.   2. Right ventricular systolic function is normal. The right ventricular  size is normal.   3. The mitral valve was not  well visualized. No evidence of mitral valve  regurgitation.   4. The aortic valve was not well visualized. Aortic valve regurgitation  is not visualized. No aortic stenosis is present.   CTPE 09/2023- personally reviewed Background of emphysema. New area of consolidation in the superior segment of the left lower lobe consistent with pneumonia. Large area of consolidation in the right upper lobe the has slightly contracted since the prior CT. Several small cystic spaces within the consolidation measuring up to 2.7 x 1.6 cm may be related to background of emphysema or represent cavitary changes.   CTPE 08/2023 personally reviewed Patchy geographic airspace consolidation in the right upper lobe superimposed on a background of emphysema and bronchiectasis.  11 mm right upper lobe nodule  is unchanged from 2018 new pulmonary nodules in the left upper lobe. For example 6 mm nodule on 4/58 and 6 mm nodule on 4/78.  Labs reviewed Eosinophil count up to 700 in 10/12/2023     Reviewed admission and discharge notes from 09/17/2023 and 10/18/2023    Assessment & Plan:   Assessment & Plan Hospital discharge follow-up Extensively reviewed inpatient notes from 2 separate hospital admissions, reviewed labs and imaging Patient reports he has progressively improved since discharge, still have residual productive sputum Will obtain chest x-ray today to ensure continued improvement Will need CT chest to ensure resolution of lung opacities and to follow-up on small lung nodules Orders:   CT SUPER D CHEST WO CONTRAST; Future   Pulmonary Function Test; Future  Chronic obstructive pulmonary disease, unspecified COPD type (HCC) Discussed the symptoms,  etiology, pathophysiology, diagnostic test, treatment, flare ups,  prognosis of COPD Patient has stopped using Trelegy because of concerns of pneumonia Advised patient to resume maintenance inhaler-I have provided samples of Stiolto.  Patient has been  prescribed LABA/LAMA. Pulmonary function test prior to next visit    Lung nodules Visible on inpatient CT scans Will need follow-up and monitoring in future. Orders:   CT SUPER D CHEST WO CONTRAST; Future   Pulmonary Function Test; Future  Abnormal findings on diagnostic imaging of lung Chest x-ray now to ensure continued resolution/evolution of findings CT chest in 6 to 8 weeks Orders:   CT SUPER D CHEST WO CONTRAST; Future   Pulmonary Function Test; Future   DG Chest 2 View; Future  Pneumonia with cavity of lung Sputum positive for Pseudomonas in the hospital.  Was treated for 10 days with Levaquin  Pseudomonas was sensitive to ciprofloxacin Seems to have improved since then, however still has some cough.  Will get chest x-ray today.  Ideally would want 3 weeks of antibiotics for cavitary pneumonia, however some of the cavitary appearing lesion on his CT chest could be secondary to emphysema vs necrotic pneumonia. No indication to prescribe antibiotics at this point Orders:   CT SUPER D CHEST WO CONTRAST; Future   Pulmonary Function Test; Future   guaiFENesin  (ROBITUSSIN) 100 MG/5ML liquid 5 mL   DG Chest 2 View; Future   6 minute walk; Future  Tobacco abuse Smoking/Tobacco Cessation Counseling MUBARAK BEVENS is a current user of tobacco or nicotine  products. He is considering quitting at this time. Counseling provided today addressed the risks of continued use and the benefits of cessation. Discussed tobacco/nicotine  use history, readiness to quit, and evidence-based treatment options including behavioral strategies, support resources, and pharmacologic therapies. Provided encouragement and educational materials on steps and resources to quit smoking. Patient questions were addressed, and follow-up recommended for continued support.  Pharmacological assistance prescribed- Wellbutrin.  Possible side effects like neuropsychiatrist symptoms like suicidal thoughts, psychosis,  mania,etc, seizure, angle closure glaucoma etc were explained in detail. Patient is willing to try wellbutrin. Advised pt to contact us  and to stop using the medicine if  any adverse effects occur.  He cannot take Chantix due to anxiety/OCD Total time spent on counseling: 5 minutes.      Orders:   Pulmonary Function Test; Future   buPROPion (WELLBUTRIN SR) 150 MG 12 hr tablet; Take 1 tablet (150 mg total) by mouth 2 (two) times daily.  Chronic respiratory failure with hypoxia (HCC) Continue home oxygen for now.  Patient uses oxygen as needed during the day and regularly at night Advised him to bring the name of his DME company to next visit 6-minute walk test prior to next visit Orders:   6 minute walk; Future  Vaccine counseling Patient will be given pneumonia vaccine today.  Discussed risks and benefit. Advised him to get flu shot in few weeks      Thank you for the opportunity to take part in the care of WAYDE GOPAUL   Return in about 6 weeks (around 12/09/2023).   Karletta Millay Pleas, MD [facility] Office: [phone]

## 2023-10-28 NOTE — Assessment & Plan Note (Addendum)
 Discussed the symptoms, etiology, pathophysiology, diagnostic test, treatment, flare ups,  prognosis of COPD Patient has stopped using Trelegy because of concerns of pneumonia Advised patient to resume maintenance inhaler-I have provided samples of Stiolto.  Patient has been prescribed LABA/LAMA. Pulmonary function test prior to next visit

## 2023-10-28 NOTE — Patient Instructions (Addendum)
 It was a pleasure to see you today. We will get a chest xray today Your inhaler, cough medicine and Wellbutrin for smoking cessation have been sent to your pharmacy Your pulmonary function test and walk test will be scheduled closer to your next follow up visit. Your will receive a call for your CT chest scheduling.

## 2023-10-28 NOTE — Addendum Note (Signed)
 Addended by: Jocelyn Lowery M on: 10/28/2023 03:01 PM   Modules accepted: Orders

## 2023-11-02 ENCOUNTER — Ambulatory Visit: Payer: Self-pay

## 2023-11-02 DIAGNOSIS — J189 Pneumonia, unspecified organism: Secondary | ICD-10-CM | POA: Insufficient documentation

## 2023-11-05 NOTE — Progress Notes (Signed)
 ATCx1, LVMTCB

## 2023-11-06 NOTE — Progress Notes (Signed)
 Called and spoke to pt - advised of CXR results per Dr. Pleas. Pt verbalized understanding, NFN.

## 2023-11-07 ENCOUNTER — Other Ambulatory Visit

## 2023-11-10 ENCOUNTER — Ambulatory Visit
Admission: RE | Admit: 2023-11-10 | Discharge: 2023-11-10 | Disposition: A | Discharge status: Home / Self Care | Source: Ambulatory Visit

## 2023-11-10 DIAGNOSIS — J189 Pneumonia, unspecified organism: Secondary | ICD-10-CM

## 2023-11-10 DIAGNOSIS — Z09 Encounter for follow-up examination after completed treatment for conditions other than malignant neoplasm: Secondary | ICD-10-CM

## 2023-11-10 DIAGNOSIS — R918 Other nonspecific abnormal finding of lung field: Secondary | ICD-10-CM

## 2023-11-16 NOTE — Progress Notes (Signed)
 CT chest still shows concerning lung consolidation. Pneumonia should have resolved by now. Pt reports his breathing is not quite right still. He likes the LABA/LAMA inhaler he received in last clinic visit. He is taking cough suppressant so doesn't have much mucous production. I discussed the CT chest results and offered a bronchoscopic biopsy and BAL for further investigation of this. I explained the bronchoscopic biopsy procedure at depth.  We discussed about other potential options including continued follow up, CT guided biopsy, surgical biopsy. Risks and benefits of bronchoscopy +/- biopsy discussed with the patient and his family in details and all the questions were answered. They understand the risk of bleeding, pneumothorax, injury to blood vessels and would like to proceed for the bronchoscopy with biopsy. We will set it up through clinic.

## 2023-11-19 NOTE — Telephone Encounter (Addendum)
 Please schedule the following:  Provider performing procedure:Dr Chadley Dziedzic Diagnosis: recurrent pneumonia, lung cavity Which side for nodule / mass? Bilateral Procedure: bronchoscopy with transbronchial biopsy Has patient been spoken to by Provider and given informed consent? Yes (telephone Oct 19) Anesthesia: general  Do you need Fluro? yes Duration of procedure: 1.5 hour Date: Nov 27, 2023 Alternate Date:  Dec 04, 2023, Nov 4,2025 Time: AM/ PM-  PM preferred Location: [facility] Does patient have OSA? no DM? No Or Latex allergy? no Medication Restriction/ Anticoagulate/Antiplatelet: none Pre-op Labs Ordered:determined by Anesthesia Imaging request:none (If, SuperDimension CT Chest, please have STAT courier sent to ENDO)

## 2023-11-20 NOTE — Addendum Note (Signed)
 Addended by: Neriyah Cercone on: 11/20/2023 08:53 AM   Modules accepted: Orders

## 2023-11-23 ENCOUNTER — Other Ambulatory Visit: Payer: Self-pay

## 2023-11-23 DIAGNOSIS — Z720 Tobacco use: Secondary | ICD-10-CM

## 2023-11-30 ENCOUNTER — Other Ambulatory Visit: Payer: Self-pay

## 2023-11-30 ENCOUNTER — Encounter (HOSPITAL_BASED_OUTPATIENT_CLINIC_OR_DEPARTMENT_OTHER): Payer: Self-pay | Admitting: Emergency Medicine

## 2023-11-30 ENCOUNTER — Emergency Department (HOSPITAL_BASED_OUTPATIENT_CLINIC_OR_DEPARTMENT_OTHER)
Admission: EM | Admit: 2023-11-30 | Discharge: 2023-12-01 | Disposition: A | Discharge status: Home / Self Care | Attending: Emergency Medicine | Admitting: Emergency Medicine

## 2023-11-30 DIAGNOSIS — Z85038 Personal history of other malignant neoplasm of large intestine: Secondary | ICD-10-CM | POA: Insufficient documentation

## 2023-11-30 DIAGNOSIS — M7989 Other specified soft tissue disorders: Secondary | ICD-10-CM | POA: Insufficient documentation

## 2023-11-30 DIAGNOSIS — J4489 Other specified chronic obstructive pulmonary disease: Secondary | ICD-10-CM | POA: Insufficient documentation

## 2023-11-30 DIAGNOSIS — R600 Localized edema: Secondary | ICD-10-CM | POA: Diagnosis not present

## 2023-11-30 LAB — CBC WITH DIFFERENTIAL/PLATELET
Abs Immature Granulocytes: 0.03 K/uL (ref 0.00–0.07)
Basophils Absolute: 0.1 K/uL (ref 0.0–0.1)
Basophils Relative: 1 %
Eosinophils Absolute: 0.3 K/uL (ref 0.0–0.5)
Eosinophils Relative: 3 %
HCT: 40.8 % (ref 39.0–52.0)
Hemoglobin: 13.5 g/dL (ref 13.0–17.0)
Immature Granulocytes: 0 %
Lymphocytes Relative: 18 %
Lymphs Abs: 1.5 K/uL (ref 0.7–4.0)
MCH: 32.2 pg (ref 26.0–34.0)
MCHC: 33.1 g/dL (ref 30.0–36.0)
MCV: 97.4 fL (ref 80.0–100.0)
Monocytes Absolute: 1.1 K/uL — ABNORMAL HIGH (ref 0.1–1.0)
Monocytes Relative: 13 %
Neutro Abs: 5.3 K/uL (ref 1.7–7.7)
Neutrophils Relative %: 65 %
Platelets: 296 K/uL (ref 150–400)
RBC: 4.19 MIL/uL — ABNORMAL LOW (ref 4.22–5.81)
RDW: 13.8 % (ref 11.5–15.5)
WBC: 8.2 K/uL (ref 4.0–10.5)
nRBC: 0 % (ref 0.0–0.2)

## 2023-11-30 LAB — BASIC METABOLIC PANEL WITH GFR
Anion gap: 8 (ref 5–15)
BUN: 7 mg/dL — ABNORMAL LOW (ref 8–23)
CO2: 28 mmol/L (ref 22–32)
Calcium: 9.2 mg/dL (ref 8.9–10.3)
Chloride: 101 mmol/L (ref 98–111)
Creatinine, Ser: 0.87 mg/dL (ref 0.61–1.24)
GFR, Estimated: >60 mL/min (ref >60)
Glucose, Bld: 104 mg/dL — ABNORMAL HIGH (ref 70–99)
Potassium: 3.5 mmol/L (ref 3.5–5.1)
Sodium: 137 mmol/L (ref 135–145)

## 2023-11-30 MED ORDER — FUROSEMIDE 10 MG/ML IJ SOLN
INTRAMUSCULAR | Status: AC
  Filled 2023-11-30: qty 12

## 2023-11-30 MED ORDER — FUROSEMIDE 10 MG/ML IJ SOLN
120.0000 mg | Freq: Once | INTRAVENOUS | Status: AC
  Administered 2023-11-30: 120 mg via INTRAVENOUS
  Filled 2023-11-30: qty 12

## 2023-11-30 NOTE — ED Triage Notes (Signed)
 Swelling in bilateral legs  Started after being in hospital On lasix  has been increasing dose per md order Pain in legs Denies increased SOB, no chest pain

## 2023-11-30 NOTE — ED Provider Notes (Signed)
 [place] [facility] DEPARTMENT AT [facility] Provider Note   CSN: [id number] Arrival date & time: 11/30/23  2120     Patient presents with: Leg Swelling   [name] is a 68 y.o. male.  {Add pertinent medical, surgical, social history, OB history to HPI:32947} HPI     This is a 68 year old male who presents with concern for lower extremity swelling.  Patient reports that swelling has progressed since discharge from the hospital.  Recently admitted to the hospital in mid September for multifocal pneumonia.  At that time he was taken off blood pressure medication but was started on Lasix  40 mg daily.  Over the last week he has been increasing that to 80 mg daily.  He cannot tell me whether he is actually urinating more frequently or not.  He does not tolerate TED hose because of pain.  He has not had any increasing shortness of breath or chest discomfort.  He has noted some slight erythema of the lower extremities.  Patient continues to smoke.  Prior to Admission medications   Medication Sig Start Date End Date Taking? Authorizing Provider  albuterol  (PROVENTIL ) (2.5 MG/3ML) 0.083% nebulizer solution Take 3 mLs (2.5 mg total) by nebulization every 4 (four) hours as needed for wheezing or shortness of breath. 01/15/21   Pearlean Manus, MD  albuterol  (VENTOLIN  HFA) 108 (90 Base) MCG/ACT inhaler Inhale 2 puffs into the lungs every 4 (four) hours as needed. Patient taking differently: Inhale 2 puffs into the lungs every 4 (four) hours as needed for shortness of breath or wheezing. 05/13/23   Renato Dorothey HERO, NP  b complex vitamins capsule Take 1 capsule by mouth daily.    [provider]  buPROPion (WELLBUTRIN SR) 150 MG 12 hr tablet Take 1 tablet (150 mg total) by mouth 2 (two) times daily. 10/28/23   Baral, Dipti, MD  calcium  carbonate (TUMS - DOSED IN MG ELEMENTAL CALCIUM ) 500 MG chewable tablet Chew 500-1,000 mg by mouth 2 (two) times daily as needed for  indigestion or heartburn.    [provider]  Cholecalciferol (VITAMIN D3) 125 MCG (5000 UT) TABS Take 10,000 Units by mouth daily.    [provider]  furosemide  (LASIX ) 40 MG tablet Take 1 tablet (40 mg total) by mouth daily. 10/15/23   Raenelle Coria, MD  ipratropium-albuterol  (DUONEB) 0.5-2.5 (3) MG/3ML SOLN Inhale 3 mLs into the lungs every 6 (six) hours as needed. Patient taking differently: Inhale 3 mLs into the lungs See admin instructions. Nebulize 3 ml's and inhale into the lungs in the morning & at bedtime AND up to 2 times a day, additionally, as needed for shortness of breath or wheezing 09/09/23 10/28/23  Regalado, Belkys A, MD  linaclotide (LINZESS) 145 MCG CAPS capsule Take 145 mcg by mouth daily as needed (for constipation).    [provider]  Magnesium 300 MG TABS Take 600 mg by mouth daily.    [provider]  oxyCODONE -acetaminophen  (PERCOCET/ROXICET) 5-325 MG tablet Take 1 tablet by mouth every 6 (six) hours as needed for severe pain or moderate pain. Patient taking differently: Take 2 tablets by mouth in the morning and at bedtime. 12/29/20   Lorriane Holmes, MD  potassium chloride  SA (KLOR-CON  M) 20 MEQ tablet Take 1 tablet (20 mEq total) by mouth daily. 10/15/23 11/14/23  Ghimire, Kuber, MD  promethazine (PHENERGAN) 25 MG tablet Take 25 mg by mouth every 6 (six) hours as needed for nausea or vomiting. 10/02/23   [provider]  tamsulosin  (FLOMAX ) 0.4 MG CAPS capsule Take 1 capsule (0.4 mg total) by mouth 2 (two) times daily. Patient taking differently: Take 0.8 mg by mouth daily after breakfast. 07/03/23   Carolee Sherwood JONETTA DOUGLAS, MD  Tiotropium Bromide-Olodaterol (STIOLTO RESPIMAT) 2.5-2.5 MCG/ACT AERS Inhale 2 puffs into the lungs daily. 10/28/23   Baral, Dipti, MD    Allergies: Patient has no known allergies.    Review of Systems  Constitutional:  Negative for fever.  Respiratory:  Negative for shortness of breath.   Cardiovascular:   Positive for leg swelling. Negative for chest pain.  All other systems reviewed and are negative.   Updated Vital Signs BP 92/62 (BP Location: Right Arm)   Pulse (!) 108   Temp 97.8 F (36.6 C)   Resp (!) 21   SpO2 93%   Physical Exam Vitals and nursing note reviewed.  Constitutional:      Appearance: He is well-developed.     Comments: Chronically ill-appearing, no acute distress  HENT:     Head: Normocephalic and atraumatic.  Eyes:     Pupils: Pupils are equal, round, and reactive to light.  Cardiovascular:     Rate and Rhythm: Normal rate and regular rhythm.     Heart sounds: Normal heart sounds. No murmur heard. Pulmonary:     Effort: Pulmonary effort is normal. No respiratory distress.     Breath sounds: Wheezing present.  Abdominal:     Palpations: Abdomen is soft.     Tenderness: There is no abdominal tenderness. There is no rebound.  Musculoskeletal:     Cervical back: Neck supple.     Right lower leg: Edema present.     Left lower leg: Edema present.     Comments: 2+ bilateral lower extremity edema to the knees, slight overlying erythema bilaterally  Lymphadenopathy:     Cervical: No cervical adenopathy.  Skin:    General: Skin is warm and dry.  Neurological:     Mental Status: He is alert and oriented to person, place, and time.  Psychiatric:        Mood and Affect: Mood normal.     (all labs ordered are listed, but only abnormal results are displayed) Labs Reviewed  CBC WITH DIFFERENTIAL/PLATELET - Abnormal; Notable for the following components:      Result Value   RBC 4.19 (*)    Monocytes Absolute 1.1 (*)    All other components within normal limits  BASIC METABOLIC PANEL WITH GFR - Abnormal; Notable for the following components:   Glucose, Bld 104 (*)    BUN 7 (*)    All other components within normal limits    EKG: None  Radiology: No results found.  {Document cardiac monitor, telemetry assessment procedure when  appropriate:32947} Procedures   Medications Ordered in the ED  furosemide  (LASIX ) 120 mg in dextrose  5 % 50 mL IVPB (120 mg Intravenous New Bag/Given 11/30/23 2342)  furosemide  (LASIX ) 10 MG/ML injection (  Not Given 11/30/23 2342)      {Click here for ABCD2, HEART and other calculators REFRESH Note before signing:1}                              Medical Decision Making Amount and/or Complexity of Data Reviewed Labs: ordered.   ***  {Document critical care time when appropriate  Document review of labs and clinical decision tools ie CHADS2VASC2, etc  Document your independent review of  radiology images and any outside records  Document your discussion with family members, caretakers and with consultants  Document social determinants of health affecting pt's care  Document your decision making why or why not admission, treatments were needed:32947:::1}   Final diagnoses:  None    ED Discharge Orders     None

## 2023-12-01 MED ORDER — FUROSEMIDE 40 MG PO TABS: ORAL_TABLET | ORAL | 0 refills | Status: AC

## 2023-12-01 MED ORDER — DOXYCYCLINE HYCLATE 100 MG PO CAPS: 100.0000 mg | ORAL_CAPSULE | Freq: Two times a day (BID) | ORAL | 0 refills | Status: DC

## 2023-12-01 NOTE — Discharge Instructions (Signed)
 You were seen today for ongoing leg edema.  Adjust medications as advised and follow-up closely with your primary doctor.  If redness of your legs increases or you develop fevers or infectious symptoms, you may start antibiotic.

## 2023-12-03 ENCOUNTER — Ambulatory Visit (HOSPITAL_COMMUNITY): Admitting: Registered Nurse

## 2023-12-03 ENCOUNTER — Ambulatory Visit (HOSPITAL_COMMUNITY)

## 2023-12-03 ENCOUNTER — Encounter (HOSPITAL_COMMUNITY): Payer: Self-pay

## 2023-12-03 ENCOUNTER — Other Ambulatory Visit: Payer: Self-pay

## 2023-12-03 ENCOUNTER — Ambulatory Visit (HOSPITAL_COMMUNITY): Admission: RE | Admit: 2023-12-03 | Discharge: 2023-12-03 | Disposition: A | Discharge status: Home / Self Care

## 2023-12-03 ENCOUNTER — Encounter (HOSPITAL_COMMUNITY): Admission: RE | Disposition: A | Discharge status: Home / Self Care | Payer: Self-pay | Source: Home / Self Care

## 2023-12-03 DIAGNOSIS — J4489 Other specified chronic obstructive pulmonary disease: Secondary | ICD-10-CM | POA: Insufficient documentation

## 2023-12-03 DIAGNOSIS — Z9981 Dependence on supplemental oxygen: Secondary | ICD-10-CM | POA: Insufficient documentation

## 2023-12-03 DIAGNOSIS — R918 Other nonspecific abnormal finding of lung field: Secondary | ICD-10-CM | POA: Diagnosis not present

## 2023-12-03 DIAGNOSIS — F17210 Nicotine dependence, cigarettes, uncomplicated: Secondary | ICD-10-CM | POA: Diagnosis not present

## 2023-12-03 DIAGNOSIS — J189 Pneumonia, unspecified organism: Secondary | ICD-10-CM

## 2023-12-03 DIAGNOSIS — J441 Chronic obstructive pulmonary disease with (acute) exacerbation: Secondary | ICD-10-CM | POA: Diagnosis not present

## 2023-12-03 DIAGNOSIS — R0902 Hypoxemia: Secondary | ICD-10-CM | POA: Insufficient documentation

## 2023-12-03 HISTORY — PX: VIDEO BRONCHOSCOPY: SHX5072

## 2023-12-03 LAB — BODY FLUID CELL COUNT WITH DIFFERENTIAL
Eos, Fluid: 0 %
Lymphs, Fluid: 2 %
Monocyte-Macrophage-Serous Fluid: 3 % — ABNORMAL LOW (ref 50–90)
Neutrophil Count, Fluid: 95 % — ABNORMAL HIGH (ref 0–25)
Total Nucleated Cell Count, Fluid: 610 uL (ref 0–1000)

## 2023-12-03 LAB — CBC
HCT: 42.4 % (ref 39.0–52.0)
Hemoglobin: 13.7 g/dL (ref 13.0–17.0)
MCH: 31.6 pg (ref 26.0–34.0)
MCHC: 32.3 g/dL (ref 30.0–36.0)
MCV: 97.7 fL (ref 80.0–100.0)
Platelets: 319 K/uL (ref 150–400)
RBC: 4.34 MIL/uL (ref 4.22–5.81)
RDW: 13.6 % (ref 11.5–15.5)
WBC: 8.8 K/uL (ref 4.0–10.5)
nRBC: 0 % (ref 0.0–0.2)

## 2023-12-03 LAB — TYPE AND SCREEN
ABO/RH(D): O POS
Antibody Screen: NEGATIVE

## 2023-12-03 LAB — ABO/RH: ABO/RH(D): O POS

## 2023-12-03 SURGERY — BRONCHOSCOPY, WITH FLUOROSCOPY
Anesthesia: General | Laterality: Bilateral

## 2023-12-03 MED ORDER — LIDOCAINE 2% (20 MG/ML) 5 ML SYRINGE
INTRAMUSCULAR | Status: DC | PRN
Start: 2023-12-03 — End: 2023-12-03
  Administered 2023-12-03: 60 mg via INTRAVENOUS

## 2023-12-03 MED ORDER — ONDANSETRON HCL 4 MG/2ML IJ SOLN
INTRAMUSCULAR | Status: DC | PRN
  Administered 2023-12-03: 4 mg via INTRAVENOUS

## 2023-12-03 MED ORDER — PROPOFOL 10 MG/ML IV BOLUS
INTRAVENOUS | Status: DC | PRN
Start: 2023-12-03 — End: 2023-12-03
  Administered 2023-12-03: 120 mg via INTRAVENOUS

## 2023-12-03 MED ORDER — PROPOFOL 500 MG/50ML IV EMUL
INTRAVENOUS | Status: DC | PRN
  Administered 2023-12-03: 125 ug/kg/min via INTRAVENOUS

## 2023-12-03 MED ORDER — OXYCODONE HCL 5 MG PO TABS: 5.0000 mg | ORAL_TABLET | Freq: Once | ORAL | Status: DC | PRN

## 2023-12-03 MED ORDER — ROCURONIUM BROMIDE 10 MG/ML (PF) SYRINGE
PREFILLED_SYRINGE | INTRAVENOUS | Status: DC | PRN
  Administered 2023-12-03: 40 mg via INTRAVENOUS

## 2023-12-03 MED ORDER — PHENYLEPHRINE HCL-NACL 20-0.9 MG/250ML-% IV SOLN
INTRAVENOUS | Status: DC | PRN
  Administered 2023-12-03: 30 ug/min via INTRAVENOUS

## 2023-12-03 MED ORDER — ONDANSETRON HCL 4 MG/2ML IJ SOLN: 4.0000 mg | Freq: Once | INTRAMUSCULAR | Status: DC | PRN

## 2023-12-03 MED ORDER — ALBUTEROL SULFATE HFA 108 (90 BASE) MCG/ACT IN AERS
INHALATION_SPRAY | RESPIRATORY_TRACT | Status: DC | PRN
  Administered 2023-12-03 (×2): 2 via RESPIRATORY_TRACT

## 2023-12-03 MED ORDER — OXYCODONE HCL 5 MG/5ML PO SOLN: 5.0000 mg | Freq: Once | ORAL | Status: DC | PRN

## 2023-12-03 MED ORDER — PHENYLEPHRINE 80 MCG/ML (10ML) SYRINGE FOR IV PUSH (FOR BLOOD PRESSURE SUPPORT)
PREFILLED_SYRINGE | INTRAVENOUS | Status: DC | PRN
  Administered 2023-12-03 (×4): 160 ug via INTRAVENOUS
  Administered 2023-12-03: 80 ug via INTRAVENOUS

## 2023-12-03 MED ORDER — EPINEPHRINE 1 MG/10ML IV SOSY
PREFILLED_SYRINGE | INTRAVENOUS | Status: AC
  Filled 2023-12-03: qty 10

## 2023-12-03 MED ORDER — SODIUM CHLORIDE (PF) 0.9 % IJ SOLN
INTRAMUSCULAR | Status: DC | PRN
  Administered 2023-12-03: 2 mL

## 2023-12-03 MED ORDER — SODIUM CHLORIDE 0.9 % IV SOLN: INTRAVENOUS | Status: DC | PRN

## 2023-12-03 MED ORDER — FENTANYL CITRATE (PF) 100 MCG/2ML IJ SOLN: 25.0000 ug | INTRAMUSCULAR | Status: DC | PRN

## 2023-12-03 MED ORDER — DEXAMETHASONE SOD PHOSPHATE PF 10 MG/ML IJ SOLN
INTRAMUSCULAR | Status: DC | PRN
  Administered 2023-12-03: 10 mg via INTRAVENOUS

## 2023-12-03 NOTE — Transfer of Care (Signed)
 Immediate Anesthesia Transfer of Care Note  Patient: [name]  Procedure(s) Performed: BRONCHOSCOPY, WITH FLUOROSCOPY (Bilateral)  Patient Location: Endoscopy Unit  Anesthesia Type:General  Level of Consciousness: awake  Airway & Oxygen Therapy: Patient Spontanous Breathing and Patient connected to nasal cannula oxygen  Post-op Assessment: Report given to RN and Post -op Vital signs reviewed and stable  Post vital signs: Reviewed and stable  Last Vitals:  Vitals Value Taken Time  BP 97/84 12/03/23 14:01  Temp 36.3 C 12/03/23 13:54  Pulse 88 12/03/23 14:07  Resp 22 12/03/23 14:07  SpO2 93 % 12/03/23 14:07  Vitals shown include unfiled device data.  Last Pain:  Vitals:   12/03/23 1400  TempSrc:   PainSc: 0-No pain         Complications: No notable events documented.

## 2023-12-03 NOTE — H&P (Signed)
 New Patient Pulmonology Office Visit   Subjective:  Patient ID: [name], male    DOB: May 04, 1955  MRN: [id number]  Referred by: No ref. provider found  CC:  No chief complaint on file.   HPI [name] is a 68 y.o. male who was seen in pulmonary clinic after recent inpatient stay for multifocal pneumonia.  He was referred to pulmonary clinic for establishing care for chronic COPD.  He has a history of COPD, chronic hypoxemia on intermittent oxygen use.  He was admitted at the hospital for pneumonia in August and again in September/2025.  Both times he was treated with antibiotics-Levaquin . During second admission, he was found to have rare Pseudomonas in his sputum  He underwent a repeat CT chets which shows concerning infiltrates/consolidation.  D/w pt who agreed with bronch, BAL and bx  Continues to have cough, shortness of breath    ROS Review of symptoms negative except mentioned above   Allergies: Patient has no known allergies.  Current Facility-Administered Medications:    fentaNYL  (SUBLIMAZE ) injection 25-50 mcg, 25-50 mcg, Intravenous, Q5 min PRN, [name], MD   ondansetron  (ZOFRAN ) injection 4 mg, 4 mg, Intravenous, Once PRN, [name], MD   oxyCODONE  (Oxy IR/ROXICODONE ) immediate release tablet 5 mg, 5 mg, Oral, Once PRN **OR** oxyCODONE  (ROXICODONE ) 5 MG/5ML solution 5 mg, 5 mg, Oral, Once PRN, [name], MD Past Medical History:  Diagnosis Date   Anemia    IRON 2005   Anxiety    Arthritis    LOWER LUMBAR STENOSIS , ARTHRITIS HANDS AND SHOULDERS   Asthma    Cancer (HCC) 2005   COLON SURGERY AND CHEMO   Complication of anesthesia    WOKE UP DURING PORT PLACEMENT AND ENDOSCOPY, SLOW TO AWAKEN FROM COLON RESECTION 2010   COPD (chronic obstructive pulmonary disease) (HCC)    Dyspnea    WITH EXERTION   GERD (gastroesophageal reflux disease)    Hydrocele, right    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 Social History   Socioeconomic History   Marital status: Divorced    Spouse name: Not on file   Number of children: Not on file   Years of education: Not on file   Highest education level: Not on file  Occupational History   Not on file  Tobacco Use   Smoking status: Every Day    Current packs/day: 1.00    Average packs/day: 1 pack/day for 54.8 years (54.8 ttl pk-yrs)    Types: Cigarettes    Start date: 38   Smokeless tobacco: Never   Tobacco comments:    Has cut down from 3 cartons a day to 1 carton a day MMP   Vaping Use   Vaping status: Never Used  Substance and Sexual Activity   Alcohol use: No   Drug use: No   Sexual activity: Not on file  Other  Topics Concern   Not on file  Social History Narrative   Not on file   Social Drivers of Health   Financial Resource Strain: Not on file  Food Insecurity: No Food Insecurity (10/10/2023)   Hunger Vital Sign    Worried About Running Out of Food in the Last Year: Never true    Ran Out of Food in the Last Year: Never true  Transportation Needs: No Transportation Needs (10/10/2023)   PRAPARE - Administrator, Civil Service (Medical): No    Lack of Transportation (Non-Medical): No  Physical Activity: Not on file  Stress: Not on file  Social Connections: Socially Isolated (10/10/2023)   Social Connection and Isolation Panel    Frequency of Communication with Friends and Family: More than three times a week    Frequency of Social Gatherings with Friends and Family: Once a week     Attends Religious Services: Never    Database Administrator or Organizations: No    Attends Banker Meetings: Never    Marital Status: Separated  Intimate Partner Violence: Not At Risk (10/10/2023)   Humiliation, Afraid, Rape, and Kick questionnaire    Fear of Current or Ex-Partner: No    Emotionally Abused: No    Physically Abused: No    Sexually Abused: No         Objective:  BP 111/80   Pulse 86   Temp (!) 97.4 F (36.3 C) (Temporal)   Resp 16   Ht 5' 8 (1.727 m)   Wt 102.1 kg   SpO2 96%   BMI 34.21 kg/m    Physical Exam Constitutional:      General: He is not in acute distress.    Appearance: Normal appearance.  HENT:     Mouth/Throat:     Mouth: Mucous membranes are moist.  Cardiovascular:     Rate and Rhythm: Normal rate.  Pulmonary:     Effort: No respiratory distress.     Breath sounds: No wheezing or rales.  Musculoskeletal:     Right lower leg: No edema.     Left lower leg: No edema.  Skin:    General: Skin is warm.  Neurological:     Mental Status: He is alert and oriented to person, place, and time.  Psychiatric:        Mood and Affect: Mood normal.     Diagnostic Review:    Pft     No data to display          Super D CT chest 11/12/2023 1. Waxing and waning areas of cavitary consolidation in the lungs bilaterally, indicative of an ongoing pneumonia, possibly fungal in etiology. 2. Tiny left renal stone. 3. Supraumbilical midline ventral hernia contains transverse colon and has a wide neck. 4. Aortic atherosclerosis (ICD10-I70.0). Coronary artery calcification. 5. Enlarged pulmonic trunk, indicative of pulmonary arterial hypertension. 6.  Emphysema   Reviewed recent admission and discharge notes from September/2025.  ECHO 09/2023  1. Technically difficult study, very limited views. Left ventricular  ejection fraction, by estimation, is 60 to 65%. The left ventricle has  normal function. The left ventricle has  no regional wall motion  abnormalities.   2. Right ventricular systolic function is normal. The right ventricular  size is normal.   3. The mitral valve was not well visualized. No evidence of mitral valve  regurgitation.   4. The aortic valve was not well visualized. Aortic valve regurgitation  is not visualized.  No aortic stenosis is present.   CTPE 09/2023- personally reviewed Background of emphysema. New area of consolidation in the superior segment of the left lower lobe consistent with pneumonia. Large area of consolidation in the right upper lobe the has slightly contracted since the prior CT. Several small cystic spaces within the consolidation measuring up to 2.7 x 1.6 cm may be related to background of emphysema or represent cavitary changes.   CTPE 08/2023 personally reviewed Patchy geographic airspace consolidation in the right upper lobe superimposed on a background of emphysema and bronchiectasis.  11 mm right upper lobe nodule  is unchanged from 2018 new pulmonary nodules in the left upper lobe. For example 6 mm nodule on 4/58 and 6 mm nodule on 4/78.  Labs reviewed Eosinophil count up to 700 in 10/12/2023     Reviewed admission and discharge notes from 09/17/2023 and 10/18/2023    Assessment & Plan:  Abnormal CT chest Persistent infiltrates COPD   - proceed with bronchoscopy, BAL and bx  I explained the bronchoscopic biopsy procedure at depth.  We discussed about other potential options including continued follow up, CT guided biopsy, surgical biopsy. Risks and benefits of bronchoscopy +/- biopsy discussed with the patient and his family in details and all the questions were answered. They understand the risk of bleeding, pneumothorax, injury to blood vessels and would like to proceed for the bronchoscopy with biopsy.

## 2023-12-03 NOTE — Anesthesia Procedure Notes (Signed)
 Procedure Name: Intubation Date/Time: 12/03/2023 12:55 PM  Performed by: Virgil Ee, CRNAPre-anesthesia Checklist: Patient identified, Emergency Drugs available, Suction available and Patient being monitored Patient Re-evaluated:Patient Re-evaluated prior to induction Oxygen Delivery Method: Circle system utilized Preoxygenation: Pre-oxygenation with 100% oxygen Induction Type: IV induction Ventilation: Mask ventilation without difficulty Laryngoscope Size: Miller and 2 Grade View: Grade I Tube type: Oral Tube size: 8.5 mm Number of attempts: 1 Airway Equipment and Method: Stylet and Oral airway Placement Confirmation: ETT inserted through vocal cords under direct vision, positive ETCO2 and breath sounds checked- equal and bilateral Secured at: 23 cm Tube secured with: Tape Dental Injury: Teeth and Oropharynx as per pre-operative assessment

## 2023-12-03 NOTE — Anesthesia Preprocedure Evaluation (Addendum)
 Anesthesia Evaluation  Patient identified by MRN, date of birth, ID band Patient awake    Reviewed: Allergy & Precautions, NPO status , Patient's Chart, lab work & pertinent test results  History of Anesthesia Complications Negative for: history of anesthetic complications  Airway Mallampati: II  TM Distance: >3 FB Neck ROM: Full    Dental  (+) Dental Advisory Given, Partial Upper, Missing, Poor Dentition   Pulmonary asthma , pneumonia, COPD,  COPD inhaler, Current SmokerPatient did not abstain from smoking.   Pulmonary exam normal        Cardiovascular negative cardio ROS Normal cardiovascular exam   '25 TTE - EF 60 to 65%. No significant valvular d/o identified.    Neuro/Psych  PSYCHIATRIC DISORDERS Anxiety      Neuromuscular disease    GI/Hepatic Neg liver ROS,GERD  Controlled,,  Endo/Other  negative endocrine ROS    Renal/GU negative Renal ROS     Musculoskeletal  (+) Arthritis ,    Abdominal   Peds  Hematology negative hematology ROS (+)   Anesthesia Other Findings [place] Turner Syndrome   Reproductive/Obstetrics                              Anesthesia Physical Anesthesia Plan  ASA: 3  Anesthesia Plan: General   Post-op Pain Management: Minimal or no pain anticipated   Induction: Intravenous  PONV Risk Score and Plan: 1 and Treatment may vary due to age or medical condition, Ondansetron  and Dexamethasone   Airway Management Planned: Oral ETT  Additional Equipment: None  Intra-op Plan:   Post-operative Plan: Extubation in OR  Informed Consent: I have reviewed the patients History and Physical, chart, labs and discussed the procedure including the risks, benefits and alternatives for the proposed anesthesia with the patient or authorized representative who has indicated his/her understanding and acceptance.     Dental advisory given  Plan Discussed with: CRNA and  Anesthesiologist  Anesthesia Plan Comments:          Anesthesia Quick Evaluation

## 2023-12-03 NOTE — Op Note (Signed)
 Bronchoscopy Procedure Note  [name]  [id number]  1955/02/22  Date:12/03/23  Time:2:37 PM   Provider Performing:Rosella Crandell   Procedure(s):  Flexible bronchoscopy with bronchial alveolar lavage [phone]), Endobronchial biopsy ([id number]), and Transbronchial lung biopsy, single lobe (68371)  Indication(s) Persistent pneumonia Abnormal CT chest  Consent Risks of the procedure as well as the alternatives and risks of each were explained to the patient and/or caregiver.  Consent for the procedure was obtained and is signed in the bedside chart  Anesthesia GA   Time Out Verified patient identification, verified procedure, site/side was marked, verified correct patient position, special equipment/implants available, medications/allergies/relevant history reviewed, required imaging and test results available.   Sterile Technique Usual hand hygiene, masks, gowns, and gloves were used   Procedure Description Bronchoscope advanced through endotracheal tube and into airway.  Airways were examined down to subsegmental level with findings noted below.   Following diagnostic evaluation, BAL(s) performed in RIGHT upper lobe and left lower lobe with normal saline and return of 15 cc fluid on right upper lobe and 12 cc fluid in left lower lobe fluid, Endobronchial biopsies performed in right upper lobe anterior segment, and Transbronchial biopsy(s) performed under fluoroscopic guidance in right upper lobe apical segment  Findings: Minimal secretions in b/l airways- scattered thick mucous plug in right upper lobe and left lower lobe  The right upper lobe anterior segment anatomy was distorted with one of the subsegments having narrow airways. Scope could be passed beyond the narrowing. 3 passes of endobronchial biopsy were performed here. 5 passes of transbronchial bx were performed on right upper lobe apical segment under fluoroscopic guidance. Mild bleeding occurred which stopped with cold  saline and epi.    Complications/Tolerance None; patient tolerated the procedure well. Chest X-ray is needed post procedure.   EBL Minimal   Specimen(s) right upper lobe BAL left lower lobe BAL right upper lobe endobronchial bx (anterior segment) right upper lobe transbronchial bx (apical segment)

## 2023-12-04 ENCOUNTER — Encounter (HOSPITAL_COMMUNITY): Payer: Self-pay

## 2023-12-04 DIAGNOSIS — J189 Pneumonia, unspecified organism: Secondary | ICD-10-CM | POA: Insufficient documentation

## 2023-12-04 NOTE — Anesthesia Postprocedure Evaluation (Signed)
 Anesthesia Post Note  Patient: [name]  Procedure(s) Performed: BRONCHOSCOPY, WITH FLUOROSCOPY (Bilateral)     Patient location during evaluation: PACU Anesthesia Type: General Level of consciousness: awake and alert Pain management: pain level controlled Vital Signs Assessment: post-procedure vital signs reviewed and stable Respiratory status: spontaneous breathing, nonlabored ventilation and respiratory function stable Cardiovascular status: stable and blood pressure returned to baseline Anesthetic complications: no   No notable events documented.  Last Vitals:  Vitals:   12/03/23 1430 12/03/23 1440  BP: (!) 103/55 (!) 94/48  Pulse: (!) 101 98  Resp: 18 15  Temp:    SpO2: 95% 98%    Last Pain:  Vitals:   12/03/23 1440  TempSrc:   PainSc: 0-No pain                 [name] Like

## 2023-12-05 LAB — CYTOLOGY - NON PAP

## 2023-12-06 LAB — CULTURE, BAL-QUANTITATIVE W GRAM STAIN
Culture: NO GROWTH
Culture: NO GROWTH
Gram Stain: NONE SEEN
Gram Stain: NONE SEEN

## 2023-12-06 LAB — ACID FAST SMEAR (AFB, MYCOBACTERIA)
Acid Fast Smear: NEGATIVE
Acid Fast Smear: NEGATIVE
Acid Fast Smear: NEGATIVE

## 2023-12-08 LAB — ANAEROBIC CULTURE W GRAM STAIN
Gram Stain: NONE SEEN
Gram Stain: NONE SEEN

## 2023-12-08 LAB — AEROBIC/ANAEROBIC CULTURE W GRAM STAIN (SURGICAL/DEEP WOUND)
Culture: NO GROWTH
Gram Stain: NONE SEEN

## 2023-12-12 ENCOUNTER — Ambulatory Visit: Admitting: Acute Care

## 2023-12-19 ENCOUNTER — Ambulatory Visit: Admitting: *Deleted

## 2023-12-19 ENCOUNTER — Ambulatory Visit

## 2023-12-19 VITALS — BP 102/58 | HR 93 | Temp 97.7°F | Ht 67.0 in | Wt 219.0 lb

## 2023-12-19 DIAGNOSIS — Z720 Tobacco use: Secondary | ICD-10-CM

## 2023-12-19 DIAGNOSIS — J189 Pneumonia, unspecified organism: Secondary | ICD-10-CM

## 2023-12-19 DIAGNOSIS — R918 Other nonspecific abnormal finding of lung field: Secondary | ICD-10-CM

## 2023-12-19 DIAGNOSIS — Z09 Encounter for follow-up examination after completed treatment for conditions other than malignant neoplasm: Secondary | ICD-10-CM | POA: Diagnosis not present

## 2023-12-19 DIAGNOSIS — R600 Localized edema: Secondary | ICD-10-CM | POA: Diagnosis not present

## 2023-12-19 DIAGNOSIS — J449 Chronic obstructive pulmonary disease, unspecified: Secondary | ICD-10-CM | POA: Diagnosis not present

## 2023-12-19 DIAGNOSIS — F17210 Nicotine dependence, cigarettes, uncomplicated: Secondary | ICD-10-CM | POA: Diagnosis not present

## 2023-12-19 LAB — PULMONARY FUNCTION TEST
DL/VA % pred: 49 %
DL/VA: 2.03 ml/min/mmHg/L
DLCO cor % pred: 50 %
DLCO cor: 11.95 ml/min/mmHg
DLCO unc % pred: 49 %
DLCO unc: 11.64 ml/min/mmHg
FEF 25-75 Post: 0.72 L/s
FEF 25-75 Pre: 0.65 L/s
FEF2575-%Change-Post: 11 %
FEF2575-%Pred-Post: 32 %
FEF2575-%Pred-Pre: 28 %
FEV1-%Change-Post: 7 %
FEV1-%Pred-Post: 53 %
FEV1-%Pred-Pre: 49 %
FEV1-Post: 1.55 L
FEV1-Pre: 1.44 L
FEV1FVC-%Change-Post: 6 %
FEV1FVC-%Pred-Pre: 58 %
FEV6-%Change-Post: 0 %
FEV6-%Pred-Post: 86 %
FEV6-%Pred-Pre: 85 %
FEV6-Post: 3.23 L
FEV6-Pre: 3.2 L
FEV6FVC-%Change-Post: 0 %
FEV6FVC-%Pred-Post: 102 %
FEV6FVC-%Pred-Pre: 101 %
FVC-%Change-Post: 0 %
FVC-%Pred-Post: 85 %
FVC-%Pred-Pre: 84 %
FVC-Post: 3.39 L
FVC-Pre: 3.36 L
Post FEV1/FVC ratio: 46 %
Post FEV6/FVC ratio: 96 %
Pre FEV1/FVC ratio: 43 %
Pre FEV6/FVC Ratio: 95 %
RV % pred: 184 %
RV: 4.18 L
TLC % pred: 121 %
TLC: 7.82 L

## 2023-12-19 MED ORDER — BREZTRI AEROSPHERE 160-9-4.8 MCG/ACT IN AERO: 2.0000 | INHALATION_SPRAY | Freq: Two times a day (BID) | RESPIRATORY_TRACT | 9 refills | Status: AC

## 2023-12-19 MED ORDER — AEROCHAMBER MV MISC
0 refills | Status: AC
Start: 2023-12-19 — End: ?

## 2023-12-19 MED ORDER — ALBUTEROL SULFATE (2.5 MG/3ML) 0.083% IN NEBU: 2.5000 mg | INHALATION_SOLUTION | RESPIRATORY_TRACT | 12 refills | Status: AC | PRN

## 2023-12-19 MED ORDER — BREZTRI AEROSPHERE 160-9-4.8 MCG/ACT IN AERO: 2.0000 | INHALATION_SPRAY | Freq: Two times a day (BID) | RESPIRATORY_TRACT | Status: AC

## 2023-12-19 NOTE — Progress Notes (Signed)
 New Patient Pulmonology Office Visit   Subjective:  Patient ID: [name], male    DOB: 18-Oct-1955  MRN: [id number]  Referred by: Pleas Newborn, MD  CC:  Chief Complaint  Patient presents with   COPD    Doing okay.  No sx noted.  Review PFT from today.    HPI 09/2023 [name] is a 68 y.o. male who is here after recent inpatient stay for multifocal pneumonia.  He is referred to pulmonary clinic for establishing care for chronic COPD.  He has a history of COPD, chronic hypoxemia on intermittent oxygen use.  He was admitted at the hospital for pneumonia in August and again in September/2025.  Both times he was treated with antibiotics-Levaquin . During second admission, he was found to have rare Pseudomonas in his sputum   Interval hx 12/19/2023 Since last visit, he underwent repeat CT chest, which showed persistence of some lung consolidation/scarring/infiltrate.  Underwent bronchoscopy and transbronchial endobronchial biopsies on November 05/2023. Endobronchial biopsy and transbronchial biopsy were performed in right upper lobe under bronchoscopic and fluoroscopic guidance He completed pulmonary function test today He is here to review results Continues to use Stiolto daily. Reports chronic dyspnea, daily cough and some sputum production Continues to smoke 1-1.5 pack/day  ROS Review of symptoms negative except mentioned above   Allergies: Patient has no known allergies.  Current Outpatient Medications:    albuterol  (VENTOLIN  HFA) 108 (90 Base) MCG/ACT inhaler, Inhale 2 puffs into the lungs every 4 (four) hours as needed., Disp: 18 g, Rfl: 4   budesonide -glycopyrrolate -formoterol  (BREZTRI  AEROSPHERE) 160-9-4.8 MCG/ACT AERO inhaler, Inhale 2 puffs into the lungs in the morning and at bedtime., Disp: 10.7 g, Rfl: 9   budesonide -glycopyrrolate -formoterol  (BREZTRI  AEROSPHERE) 160-9-4.8 MCG/ACT AERO inhaler, Inhale 2 puffs into the lungs in the morning and at  bedtime., Disp: , Rfl:    calcium  carbonate (TUMS - DOSED IN MG ELEMENTAL CALCIUM ) 500 MG chewable tablet, Chew 500-1,000 mg by mouth 2 (two) times daily as needed for indigestion or heartburn., Disp: , Rfl:    ipratropium-albuterol  (DUONEB) 0.5-2.5 (3) MG/3ML SOLN, Inhale 3 mLs into the lungs every 6 (six) hours as needed., Disp: 360 mL, Rfl: 1   linaclotide (LINZESS) 145 MCG CAPS capsule, Take 145 mcg by mouth daily as needed (for constipation)., Disp: , Rfl:    oxyCODONE -acetaminophen  (PERCOCET/ROXICET) 5-325 MG tablet, Take 1 tablet by mouth every 6 (six) hours as needed for severe pain or moderate pain., Disp: 20 tablet, Rfl: 0   promethazine (PHENERGAN) 25 MG tablet, Take 25 mg by mouth every 6 (six) hours as needed for nausea or vomiting., Disp: , Rfl:    Spacer/Aero-Holding Chambers (AEROCHAMBER MV) inhaler, Use as instructed, Disp: 1 each, Rfl: 0   tamsulosin  (FLOMAX ) 0.4 MG CAPS capsule, Take 1 capsule (0.4 mg total) by mouth 2 (two) times daily., Disp: 180 capsule, Rfl: 4   albuterol  (PROVENTIL ) (2.5 MG/3ML) 0.083% nebulizer solution, Take 3 mLs (2.5 mg total) by nebulization every 4 (four) hours as needed for wheezing or shortness of breath., Disp: 75 mL, Rfl: 12   buPROPion  (WELLBUTRIN  SR) 150 MG 12 hr tablet, Take 1 tablet (150 mg total) by mouth 2 (two) times daily. (Patient not taking: Reported on 12/19/2023), Disp: 60 tablet, Rfl: 1   furosemide  (LASIX ) 40 MG tablet, Take 2 tablets (80 mg) by mouth in AM, take 1 tablet (40 mg) by mouth in PM for 5 days (Patient not taking: Reported on 12/19/2023), Disp: 15 tablet, Rfl:  0  Current Facility-Administered Medications:    guaiFENesin  (ROBITUSSIN) 100 MG/5ML liquid 5 mL, 5 mL, Oral, Q4H PRN,  Past Medical History:  Diagnosis Date   Anemia    IRON 2005   Anxiety    Arthritis    LOWER LUMBAR STENOSIS , ARTHRITIS HANDS AND SHOULDERS   Asthma    Cancer (HCC) 2005   COLON SURGERY AND CHEMO   Complication of anesthesia    WOKE UP DURING  PORT PLACEMENT AND ENDOSCOPY, SLOW TO AWAKEN FROM COLON RESECTION 2010   COPD (chronic obstructive pulmonary disease) (HCC)    Dyspnea    WITH EXERTION   GERD (gastroesophageal reflux disease)    Hydrocele, right    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    Pneumonia 2016 LAST    X 3 EPISODES IN PAST   Vitamin D deficiency    Past Surgical History:  Procedure Laterality Date   COLON RESECTION  2010   HERNIA REPAIR     HYDROCELE EXCISION Right 12/16/2016   Procedure: HYDROCELECTOMY ADULT RGHT;  Surgeon: Carolee Sherwood JONETTA DOUGLAS, MD;  Location: [facility] [place];  Service: Urology;  Laterality: Right;   PORT PLACEMENT     LEFT CHEST   UPPER GI ENDOSCOPY     VIDEO BRONCHOSCOPY Bilateral 12/03/2023   Procedure: BRONCHOSCOPY, WITH FLUOROSCOPY;  Surgeon: Pleas Newborn, MD;  Location: [facility];  Service: Pulmonary;  Laterality: Bilateral;  transbronchial biopsy, need radial ultrasound   No family history on file. Social History   Socioeconomic History   Marital status: Divorced    Spouse name: Not on file   Number of children: Not on file   Years of education: Not on file   Highest education level: Not on file  Occupational History   Not on file  Tobacco Use   Smoking status: Every Day    Current packs/day: 1.00    Average packs/day: 1 pack/day for 54.9 years (54.9 ttl pk-yrs)    Types: Cigarettes    Start date: 55   Smokeless tobacco: Never   Tobacco comments:    Has cut down from 3 cartons a day to 1 carton a day MMP   Vaping Use   Vaping status: Never Used  Substance and Sexual Activity   Alcohol use: No   Drug use: No   Sexual activity: Not on file  Other Topics Concern   Not on file  Social History Narrative   Not on file   Social Drivers of Health   Financial Resource Strain: Not on file  Food Insecurity: No Food Insecurity (10/10/2023)   Hunger Vital Sign    Worried About Running Out of Food in the Last Year: Never true    Ran Out of Food  in the Last Year: Never true  Transportation Needs: No Transportation Needs (10/10/2023)   PRAPARE - Administrator, Civil Service (Medical): No    Lack of Transportation (Non-Medical): No  Physical Activity: Not on file  Stress: Not on file  Social Connections: Socially Isolated (10/10/2023)   Social Connection and Isolation Panel    Frequency of Communication with Friends and Family: More than three times a week    Frequency of Social Gatherings with Friends and Family: Once a week    Attends Religious Services: Never    Database Administrator or Organizations: No    Attends Banker Meetings: Never    Marital Status: Separated  Intimate Partner Violence: Not  At Risk (10/10/2023)   Humiliation, Afraid, Rape, and Kick questionnaire    Fear of Current or Ex-Partner: No    Emotionally Abused: No    Physically Abused: No    Sexually Abused: No         Objective:  BP (!) 102/58   Pulse 93   Temp 97.7 F (36.5 C) (Oral)   Ht 5' 7 (1.702 m)   Wt 219 lb (99.3 kg)   SpO2 92% Comment: room air  BMI 34.30 kg/m    Physical Exam Constitutional:      General: He is not in acute distress.    Appearance: Normal appearance.  HENT:     Mouth/Throat:     Mouth: Mucous membranes are moist.  Cardiovascular:     Rate and Rhythm: Normal rate.  Pulmonary:     Effort: No respiratory distress.     Breath sounds: No wheezing or rales.  Musculoskeletal:     Right lower leg: No edema.     Left lower leg: No edema.  Skin:    General: Skin is warm.  Neurological:     Mental Status: He is alert and oriented to person, place, and time.  Psychiatric:        Mood and Affect: Mood normal.     Diagnostic Review:    Pft    Latest Ref Rng & Units 12/19/2023   10:05 AM  PFT Results  FVC-Pre L 3.36  P  FVC-Predicted Pre % 84  P  FVC-Post L 3.39  P  FVC-Predicted Post % 85  P  Pre FEV1/FVC % % 43  P  Post FEV1/FCV % % 46  P  FEV1-Pre L 1.44  P  FEV1-Predicted  Pre % 49  P  FEV1-Post L 1.55  P  DLCO uncorrected ml/min/mmHg 11.64  P  DLCO UNC% % 49  P  DLCO corrected ml/min/mmHg 11.95  P  DLCO COR %Predicted % 50  P  DLVA Predicted % 49  P  TLC L 7.82  P  TLC % Predicted % 121  P  RV % Predicted % 184  P    P Preliminary result   Pulmonary function test November 20 01/2023 FEV1/FVC 43 FEV1 1.44 L, 49%, improved to 1.55 L, 53%, 7% change TLC 3.36 L, 84%, no bronchodilator response TLC 121% predicted RV 184% corrected DLCO 49% predicted Severe obstruction, notable but not significant bronchodilator response Evidence of air trapping and hyperinflation and reduced diffusion capacity  November 05/2023 BAL, nucleated cells 16, 95% PMN  CT chest 10/2023 1. Waxing and waning areas of cavitary consolidation in the lungs bilaterally, indicative of an ongoing pneumonia, possibly fungal in etiology. 2. Tiny left renal stone. 3. Supraumbilical midline ventral hernia contains transverse colon and has a wide neck. 4. Aortic atherosclerosis (ICD10-I70.0). Coronary artery calcification. 5. Enlarged pulmonic trunk, indicative of pulmonary arterial hypertension. 6.  Emphysema  ECHO 09/2023  1. Technically difficult study, very limited views. Left ventricular  ejection fraction, by estimation, is 60 to 65%. The left ventricle has  normal function. The left ventricle has no regional wall motion  abnormalities.   2. Right ventricular systolic function is normal. The right ventricular  size is normal.   3. The mitral valve was not well visualized. No evidence of mitral valve  regurgitation.   4. The aortic valve was not well visualized. Aortic valve regurgitation  is not visualized. No aortic stenosis is present.   CTPE 09/2023- personally reviewed Background of  emphysema. New area of consolidation in the superior segment of the left lower lobe consistent with pneumonia. Large area of consolidation in the right upper lobe the has slightly  contracted since the prior CT. Several small cystic spaces within the consolidation measuring up to 2.7 x 1.6 cm may be related to background of emphysema or represent cavitary changes.   CTPE 08/2023 personally reviewed Patchy geographic airspace consolidation in the right upper lobe superimposed on a background of emphysema and bronchiectasis.  11 mm right upper lobe nodule  is unchanged from 2018 new pulmonary nodules in the left upper lobe. For example 6 mm nodule on 4/58 and 6 mm nodule on 4/78.  Labs reviewed Eosinophil count up to 300 in November/2025, 700 in 10/12/2023     Reviewed admission and discharge notes from 09/17/2023 and 10/18/2023    Assessment & Plan:   Assessment & Plan Chronic obstructive pulmonary disease, unspecified COPD type (HCC) PFT shows severe obstruction and significant air trapping Patient remains symptomatic despite his Stiolto. DC Stiolto and start triple inhaler.  Advised patient to use Breztri  through a spacer device-2 puffs twice a day.  Advised patient to rinse mouth after use Continue albuterol  and DuoNeb as needed Patient does have elevated eosinophils and may be a candidate for antieosinophilic Biologics in the future Orders:   albuterol  (PROVENTIL ) (2.5 MG/3ML) 0.083% nebulizer solution; Take 3 mLs (2.5 mg total) by nebulization every 4 (four) hours as needed for wheezing or shortness of breath.   Spacer/Aero-Holding Chambers (AEROCHAMBER MV) inhaler; Use as instructed   budesonide -glycopyrrolate -formoterol  (BREZTRI  AEROSPHERE) 160-9-4.8 MCG/ACT AERO inhaler; Inhale 2 puffs into the lungs in the morning and at bedtime.  Lung nodules Reviewed prior and current CT chest Last CT chest October/2024 showed resolving/waxing waning nodules/infiltrates. Bronchoscopy November/2025 did not show any malignancy.  AFB and fungal cultures are pending. Discussed the potential of false negative biopsy, especially in terms of lung cancer Will need to follow  this closely, schedule CT chest in 3 months  Orders:   CT SUPER D CHEST WO CONTRAST; Future  Tobacco abuse Smoking/Tobacco Cessation Counseling JACEYON STROLE is a current user of tobacco or nicotine  products. He is considering quitting at this time. Counseling provided today addressed the risks of continued use and the benefits of cessation. Discussed tobacco/nicotine  use history, readiness to quit, and evidence-based treatment options including behavioral strategies, support resources, and pharmacologic therapies. Provided encouragement and educational materials on steps and resources to quit smoking. Patient questions were addressed, and follow-up recommended for continued support. Total time spent on counseling: 10 minutes. Advised patient to consider quitting smoking from new year 2026 Advised patient to pick a quit date.  He has Wellbutrin  I prescribed previously    Orders:   CT SUPER D CHEST WO CONTRAST; Future  Pedal edema Has Lasix  at home Reports suboptimal benefit However has not been using recently Echo September/2025 within normal limits Advised lowering salt intake and keeping legs elevated while sitting       Return in 3 months (on 03/23/2024).   Perris Conwell Pleas, MD [facility] Office: [phone]

## 2023-12-19 NOTE — Patient Instructions (Signed)
 Full PFT performed today.

## 2023-12-19 NOTE — Patient Instructions (Addendum)
 It was a pleasure to see you today. Your will receive a call for your CT chest scheduling.  Start using breztri . Use it through a spacer device 2 puff twice a day Stop using stiolto Continue using duonebs and albuterol  inhaler as needed  Lower number of cigarettes you smoke daily- consider quitting starting new year

## 2023-12-19 NOTE — Progress Notes (Signed)
 Full PFT performed today.

## 2023-12-19 NOTE — Assessment & Plan Note (Signed)
 PFT shows severe obstruction and significant air trapping Patient remains symptomatic despite his Stiolto. DC Stiolto and start triple inhaler.  Advised patient to use Breztri  through a spacer device-2 puffs twice a day.  Advised patient to rinse mouth after use Continue albuterol  and DuoNeb as needed Patient does have elevated eosinophils and may be a candidate for antieosinophilic Biologics in the future Orders:   albuterol  (PROVENTIL ) (2.5 MG/3ML) 0.083% nebulizer solution; Take 3 mLs (2.5 mg total) by nebulization every 4 (four) hours as needed for wheezing or shortness of breath.   Spacer/Aero-Holding Chambers (AEROCHAMBER MV) inhaler; Use as instructed   budesonide -glycopyrrolate -formoterol  (BREZTRI  AEROSPHERE) 160-9-4.8 MCG/ACT AERO inhaler; Inhale 2 puffs into the lungs in the morning and at bedtime.

## 2023-12-30 LAB — FUNGAL ORGANISM REFLEX

## 2023-12-30 LAB — FUNGUS CULTURE RESULT

## 2023-12-30 LAB — FUNGUS CULTURE WITH STAIN

## 2024-01-03 LAB — FUNGAL ORGANISM REFLEX

## 2024-01-03 LAB — FUNGUS CULTURE WITH STAIN

## 2024-01-03 LAB — FUNGUS CULTURE RESULT

## 2024-01-05 LAB — FUNGUS CULTURE RESULT

## 2024-01-05 LAB — FUNGUS CULTURE WITH STAIN

## 2024-01-05 LAB — FUNGAL ORGANISM REFLEX

## 2024-01-19 LAB — ACID FAST CULTURE WITH REFLEXED SENSITIVITIES (MYCOBACTERIA)
Acid Fast Culture: NEGATIVE
Acid Fast Culture: NEGATIVE
Acid Fast Culture: NEGATIVE

## 2024-02-26 ENCOUNTER — Telehealth: Payer: Self-pay

## 2024-02-26 NOTE — Telephone Encounter (Signed)
 Fax received from Dr. Deward Foy with [facility] to perform a hernia repair surgery under general anesthesia on patient.  Patient needs surgery clearance. Surgery is pending. Patient was seen on 12/19/23. Office protocol is a risk assessment can be sent to surgeon if patient has been seen in 60 days or less.    Pt needs appt for clearance. He is currently on the schedule to see Dr. Pleas 03/22/24. I called him to ask if he wanted sooner appt or if he wants to keep the one already planned. There was no answer- LMTCB. Routing to clearance pool for f/u.

## 2024-03-11 ENCOUNTER — Other Ambulatory Visit

## 2024-03-22 ENCOUNTER — Ambulatory Visit
